# Patient Record
Sex: Male | Born: 1965
Health system: Southern US, Community
[De-identification: ages and names within clinical notes are randomized; demographics above are authoritative.]

## PROBLEM LIST (undated history)

## (undated) DIAGNOSIS — I10 Essential (primary) hypertension: Secondary | ICD-10-CM

## (undated) DIAGNOSIS — E785 Hyperlipidemia, unspecified: Secondary | ICD-10-CM

## (undated) HISTORY — DX: Essential (primary) hypertension: I10

## (undated) HISTORY — DX: Hyperlipidemia, unspecified: E78.5

---

## 1997-10-05 ENCOUNTER — Encounter: Admission: RE | Admit: 1997-10-05 | Discharge: 1998-01-03 | Payer: Self-pay | Admitting: Family Medicine

## 1998-06-13 ENCOUNTER — Emergency Department (HOSPITAL_COMMUNITY): Admission: EM | Admit: 1998-06-13 | Discharge: 1998-06-13 | Payer: Self-pay | Admitting: Emergency Medicine

## 1998-06-13 ENCOUNTER — Encounter: Payer: Self-pay | Admitting: Emergency Medicine

## 1998-06-14 ENCOUNTER — Encounter: Payer: Self-pay | Admitting: Emergency Medicine

## 1998-06-14 ENCOUNTER — Emergency Department (HOSPITAL_COMMUNITY): Admission: EM | Admit: 1998-06-14 | Discharge: 1998-06-14 | Payer: Self-pay | Admitting: Emergency Medicine

## 2000-02-27 ENCOUNTER — Encounter: Admission: RE | Admit: 2000-02-27 | Discharge: 2000-02-27 | Payer: Self-pay | Admitting: Family Medicine

## 2000-02-27 ENCOUNTER — Encounter: Payer: Self-pay | Admitting: Family Medicine

## 2002-08-14 ENCOUNTER — Emergency Department (HOSPITAL_COMMUNITY): Admission: EM | Admit: 2002-08-14 | Discharge: 2002-08-15 | Payer: Self-pay

## 2003-08-21 ENCOUNTER — Emergency Department (HOSPITAL_COMMUNITY): Admission: EM | Admit: 2003-08-21 | Discharge: 2003-08-21 | Payer: Self-pay | Admitting: Emergency Medicine

## 2004-05-16 ENCOUNTER — Ambulatory Visit (HOSPITAL_BASED_OUTPATIENT_CLINIC_OR_DEPARTMENT_OTHER): Admission: RE | Admit: 2004-05-16 | Discharge: 2004-05-16 | Payer: Self-pay | Admitting: Family Medicine

## 2004-05-22 ENCOUNTER — Ambulatory Visit: Payer: Self-pay | Admitting: Internal Medicine

## 2006-06-18 ENCOUNTER — Ambulatory Visit (HOSPITAL_COMMUNITY): Admission: RE | Admit: 2006-06-18 | Discharge: 2006-06-18 | Payer: Self-pay | Admitting: General Surgery

## 2006-06-28 ENCOUNTER — Encounter: Admission: RE | Admit: 2006-06-28 | Discharge: 2006-07-01 | Payer: Self-pay | Admitting: General Surgery

## 2006-12-21 ENCOUNTER — Encounter: Admission: RE | Admit: 2006-12-21 | Discharge: 2006-12-21 | Payer: Self-pay | Admitting: Neurological Surgery

## 2007-01-15 ENCOUNTER — Encounter: Admission: RE | Admit: 2007-01-15 | Discharge: 2007-04-15 | Payer: Self-pay | Admitting: General Surgery

## 2007-01-29 ENCOUNTER — Ambulatory Visit (HOSPITAL_COMMUNITY): Admission: RE | Admit: 2007-01-29 | Discharge: 2007-01-30 | Payer: Self-pay | Admitting: General Surgery

## 2007-01-29 HISTORY — PX: LAPAROSCOPIC GASTRIC BANDING: SHX1100

## 2007-02-05 ENCOUNTER — Emergency Department (HOSPITAL_COMMUNITY): Admission: EM | Admit: 2007-02-05 | Discharge: 2007-02-06 | Payer: Self-pay | Admitting: Emergency Medicine

## 2007-06-06 ENCOUNTER — Encounter: Admission: RE | Admit: 2007-06-06 | Discharge: 2007-06-06 | Payer: Self-pay | Admitting: General Surgery

## 2008-02-20 IMAGING — CR DG CHEST 2V
2 series · 2 of 2 positions shown · non-contrast
Comparison: Chest radiograph, August 2003.

CLINICAL DATA: Morbid obesity.  Pre-op.
 CHEST - 2 VIEW - 01/25/07:

[w chest pa *]
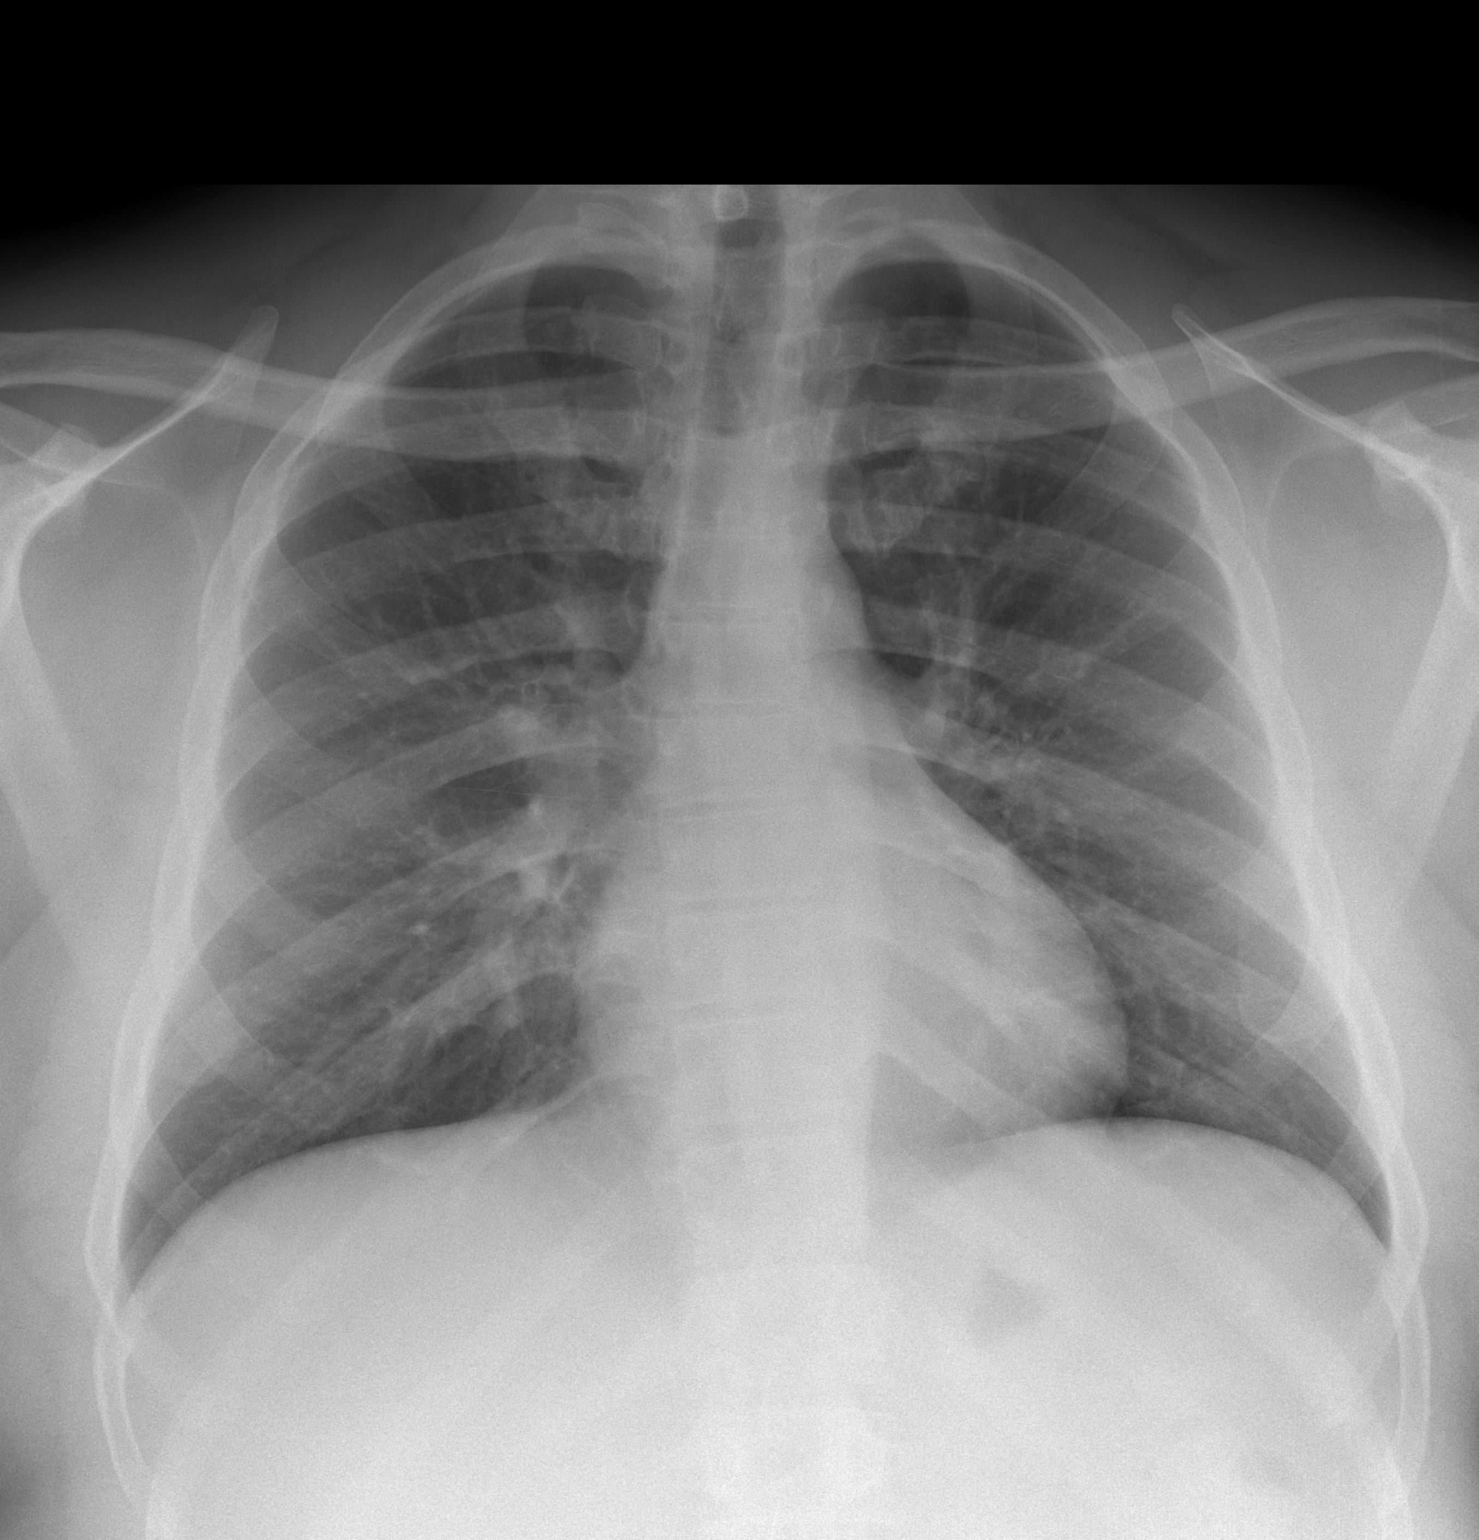

[w chest lat *]
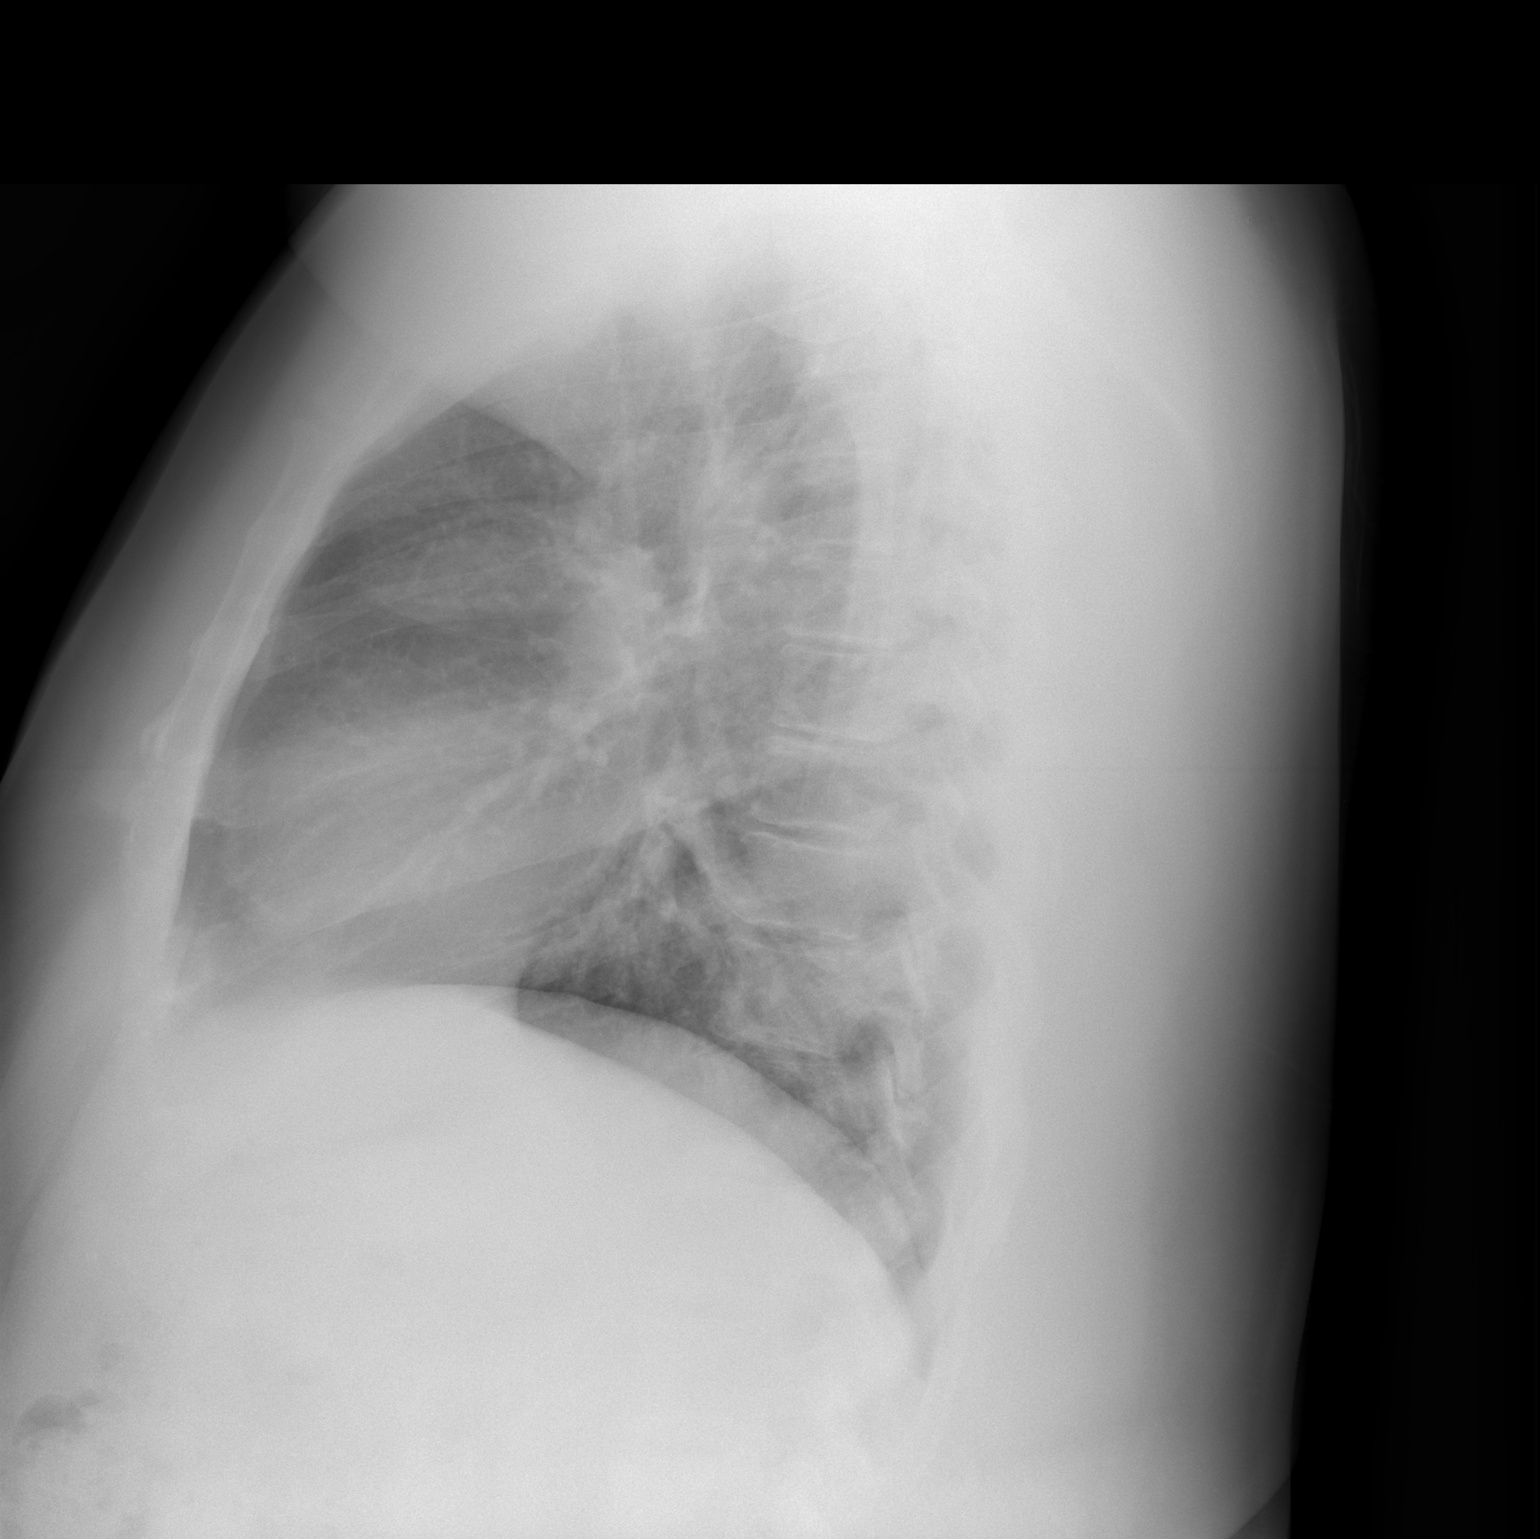

[2 of 2 positions shown; findings below may reference images not displayed]

FINDINGS: The heart and mediastinal contours are within normal limits. The lungs are normally expanded and clear.  Negative for focal air space opacity, pneumothorax, effusion, or edema.  No acute osseous abnormality identified.
IMPRESSION: No evidence of acute cardiopulmonary disease.

## 2010-05-31 NOTE — Op Note (Signed)
Gregory Brady               ACCOUNT NO.:  [id number]   MEDICAL RECORD NO.:  [id number]          PATIENT TYPE:  AMB   LOCATION:  DAY                          FACILITY:  [facility]   PHYSICIAN:  Sharlet Salina T. Hoxworth, M.D.DATE OF BIRTH:  1965-11-19   DATE OF PROCEDURE:  DATE OF DISCHARGE:                               OPERATIVE REPORT   PREOPERATIVE DIAGNOSIS:  Morbid obesity.   POSTOPERATIVE DIAGNOSES:  1. Morbid obesity.  2. Hiatal hernia.   SURGICAL PROCEDURES:  1. Placement of laparoscopic adjustable gastric band.  2. Hiatal hernia repair.   SURGEON:  Lorne Skeens. Hoxworth, M.D.   ASSISTANT:  Thornton Park. Daphine Deutscher, MD   ANESTHESIA:  General.   BRIEF HISTORY:  Gregory Brady is a 45 year old black male with a long  history of progressive morbid obesity unresponsive to medical  management.  He presents with a BMI of 42 and comorbidities of diabetes  mellitus, hypertension, dyslipidemia and sleep apnea.  After extensive  discussion and workup detailed elsewhere, we have elected to proceed  with placement of a laparoscopic adjustable gastric band.  Preoperative  upper GI series shows an apparent small hiatal hernia, and this will be  repaired at this area as well.   DESCRIPTION OF OPERATION:  The patient was brought to the operating room  and placed in supine position on the operating table and general  orotracheal anesthesia was induced.  He had received preoperative  antibiotics.  Subcutaneous heparin had been administered.  The abdomen  was widely sterilely prepped and draped.  Correct patient and procedure  were verified.  Access was obtained with an 11-mm OptiView trocar in the  left subcostal space without difficulty and pneumoperitoneum  established.  Under direct vision a 15-mm trocar was placed in the right  paraxiphoid area through the falciform ligament and an 11-mm trocar in  the right upper abdomen, then another 11-mm trocar just to the left  above the umbilicus for  the camera port and a 5-mm trocar in the left  flank.  Through a 5-mm subxiphoid site, the [facility] retractor was  placed and the left lobe of the liver elevated with excellent exposure  of the hiatus and stomach.  Initially peritoneum was incised at the  angle of His over the left crus, preserving some lateral attachments,  and the finger dissector used to bluntly dissect back toward the  retrogastric space.  Following this the pars flaccida was divided in an  avascular area and the right crus identified and the area of crossing  fat for placement of the band identified.  The sizing tube was then  placed into the stomach and the balloon inflated to 15 mL.  It was  pulled back against the hiatus and was seen to go up through a hiatal  hernia above the diaphragm.  We elected to go ahead and repair this.  The peritoneum just anterior to the right crus was then incised above  the area of where we planned to place the band and dissection was  carried into the retroesophageal area.  A large retroesophageal fat pad  was encountered  and this was dissected away from the crura and brought  down out of the mediastinum, exposing a moderate-sized hiatal hernia.  The esophagus and both crura were clearly identified.  This was a large  fat pad and was excised with the harmonic scalpel.  A posterior crural  repair was then performed with interrupted 0 Ethibond sutures using the  Endostitch, closing the hiatus down to a normal size.  At the completion  of this repair, the sizing tube was placed down into the stomach and the  balloon inflated and with 10-15 mL in the balloon, it was brought back  against the hiatus and was snug at this point.  The sizing tube was  pulled back into the upper esophagus.  The finger dissector was  introduced at the area of crossing fat.  The base of the right crus was  passed without difficulty through the retrogastric area and deployed up  into the previously-dissected area  of the angle of His.  A flushed AP  standard band system was then introduced, the tubing passed into the  finger retractor, which was then brought back around the stomach and the  band brought back around behind the stomach.  The sizing tube was  reintroduced into the abdomen and the band was buckled without  difficulty and the sizing tube easily removed.  Holding the tubing down  toward the patient's feet, the fundus was then imbricated up over the  band to the small gastric pouch with three interrupted 2-0 Ethibond  sutures.  The band appeared to be in excellent position.  There was no  bleeding.  The tubing was brought out through the right upper abdominal  port site.  The [place]athanson retractor was removed, all CO2 evacuated and  trocars removed.  This port site was enlarged to place the port and the  anterior fascia exposed.  The tubing was trimmed and attached to the  port, which was then sutured to the anterior fascia with four  interrupted 2-0 Prolene sutures.  The tubing was seen to curve smoothly  into the abdomen.  Incisions were irrigated.  The subcu was closed at  the port site with running 2-0 Vicryl and skin incisions were closed  with subcuticular 4-0 Monocryl and Dermabond.  Sponge, instrument and  needle counts were correct.  The patient taken recovery in good  condition.      Lorne Skeens. Hoxworth, M.D.  Electronically Signed     BTH/MEDQ  D:  01/29/2007  T:  01/29/2007  Job:  [id number]

## 2010-06-03 NOTE — Procedures (Signed)
[name], Gregory Brady               ACCOUNT NO.:  [id number]   MEDICAL RECORD NO.:  [id number]          PATIENT TYPE:  OUT   LOCATION:  SLEEP CENTER                 FACILITY:  [facility]   PHYSICIAN:  Clinton D. Maple Hudson, M.D. DATE OF BIRTH:  08-08-1965   DATE OF STUDY:  05/16/2004                              NOCTURNAL POLYSOMNOGRAM   STUDY DATE:  May 16, 2004   REFERRING PHYSICIAN:  Dr. Blair Heys   INDICATION FOR STUDY:  Hypersomnia with sleep apnea.  Epworth Sleepiness  Score 7/24, BMI 42, weight 340 pounds.   SLEEP ARCHITECTURE:  Total sleep time 392 minutes with sleep efficiency 84%.  Stage I was 17%, stage II 62%, stages III and IV 12%, REM was 8% of total  sleep time.  Sleep latency 20 minutes, REM latency 360 minutes, awake after  sleep onset 59 minutes, arousal index increased at 34.  No medication taken.   RESPIRATORY DATA:  Split-study protocol.  Respiratory disturbance index  (RDI, AHI) 86.4 obstructive events per hour indicating severe obstructive  sleep apnea/hypopnea syndrome before CPAP.  There were 132 obstructive  apneas and 94 hypopneas before CPAP.  Events were not positional.  REM RDI  was 0.  CPAP was titrated to 13 CWP, RDI 11.7 per hour.  He slept for 108  minutes with CPAP of 11 CWP producing an RDI of 3 per hour with some  residual snoring.  This may be more comfortable and appropriate initially  than pushing to a higher pressure to completely prevent snoring.  A medium  nasal ResMed ComfortGel Mask was used with heated humidifier.   OXYGEN DATA:  Moderate snoring with oxygen desaturation to a nadir of 79%  before CPAP.  After CPAP control saturation held 94-98% on room air.   CARDIAC DATA:  Sinus rhythm with rare PVC.   MOVEMENT/PARASOMNIA:  Occasional leg jerk.   IMPRESSION/RECOMMENDATION:  1.  Severe obstructive sleep apnea/hypopnea syndrome, respiratory      disturbance index 86.4 per hour with oxygen desaturation to 79% and      moderate snoring.  2.  Successful continuous positive airway pressure titration to a      recommended initial pressure of 11 CWP, respiratory disturbance index 3      per hour based on discussion above.  It may be preferable to accept some      breakthrough snoring while avoiding higher pressures which are less      likely to be tolerated.  A medium nasal ResMed ComfortGel Mask was used      with a heated humidifier.      CDY/MEDQ  D:  05/22/2004 10:13:12  T:  05/22/2004 11:19:19  Job:  [id number]

## 2010-10-06 LAB — HEMOGLOBIN AND HEMATOCRIT, BLOOD
HCT: 43.9
Hemoglobin: 15.1

## 2010-10-06 LAB — DIFFERENTIAL
Basophils Absolute: 0.1
Basophils Relative: 1
Eosinophils Absolute: 0
Eosinophils Relative: 0
Lymphocytes Relative: 17
Lymphs Abs: 1.2
Lymphs Abs: 1.5
Monocytes Absolute: 0.9
Monocytes Absolute: 0.7
Monocytes Relative: 7

## 2010-10-06 LAB — BASIC METABOLIC PANEL
CO2: 32
CO2: 25
Chloride: 96
Creatinine, Ser: 1.25
GFR calc Af Amer: >60
GFR calc non Af Amer: >60
Glucose, Bld: 157 — ABNORMAL HIGH
Potassium: 4.1
Potassium: 3.6
Sodium: 131 — ABNORMAL LOW

## 2010-10-06 LAB — D-DIMER, QUANTITATIVE: D-Dimer, Quant: 0.65 — ABNORMAL HIGH

## 2010-10-06 LAB — CBC
HCT: 38.8 — ABNORMAL LOW
HCT: 44.4
Hemoglobin: 13.2
Hemoglobin: 15.2
MCHC: 34.2
Platelets: 271
WBC: 12.2 — ABNORMAL HIGH

## 2010-10-06 LAB — URINALYSIS, ROUTINE W REFLEX MICROSCOPIC
Hgb urine dipstick: NEGATIVE
Nitrite: NEGATIVE
Protein, ur: NEGATIVE

## 2010-10-06 LAB — CK TOTAL AND CKMB (NOT AT ARMC): Total CK: 102

## 2011-02-27 ENCOUNTER — Telehealth (INDEPENDENT_AMBULATORY_CARE_PROVIDER_SITE_OTHER): Payer: Self-pay

## 2011-02-27 NOTE — Telephone Encounter (Signed)
Left message to call our office, rec'd office notes from [facility] Physician that indicate patient need's a lap band adjustment.  Can reschedule patient to 2/14 or 2/19 w/Dr. Johna Sheriff.

## 2011-03-16 ENCOUNTER — Telehealth (INDEPENDENT_AMBULATORY_CARE_PROVIDER_SITE_OTHER): Payer: Self-pay

## 2011-03-16 ENCOUNTER — Ambulatory Visit (INDEPENDENT_AMBULATORY_CARE_PROVIDER_SITE_OTHER): Payer: BC Managed Care – PPO | Admitting: General Surgery

## 2011-03-16 ENCOUNTER — Encounter (INDEPENDENT_AMBULATORY_CARE_PROVIDER_SITE_OTHER): Payer: Self-pay | Admitting: General Surgery

## 2011-03-16 DIAGNOSIS — Z4651 Encounter for fitting and adjustment of gastric lap band: Secondary | ICD-10-CM

## 2011-03-16 NOTE — Patient Instructions (Signed)
Concentrate on exercise 30-40 minutes 4 times per week. Try to have a solid meal at lunch time. We will see you in 2 months.

## 2011-03-16 NOTE — Progress Notes (Signed)
History: Patient returns for followup of his AP standard lap band placed January 2009. We have not seen him for one year. Followup has been somewhat of an issue. The patient initially had moderately successful weight loss being down 62 pounds in August of 2010. He however developed symptoms of over restriction and had had fluid removed late in 2011. He gained quite a bit of weight we saw him again in one year ago at 300 pounds down 34 pounds from surgery. A refill was done. He however does not appear to be getting adequate restriction from the band. His weight is up 10 pounds over one year now at 310 which is a 24 pound weight loss from surgery. He reports he has a small protein shake for breakfast and then either a protein smoothly or appropriate solid food at lunch. At dinner he eats appropriate solid food but essentially unlimited amounts. He gets rare vomiting if he does not chew. No nighttime regurgitation or reflux.  Past Medical History  Diagnosis Date  . Diabetes mellitus   . Hyperlipidemia   . Hypertension    Past Surgical History  Procedure Date  . Laparoscopic gastric banding 01/29/2007   Current Outpatient Prescriptions  Medication Sig Dispense Refill  . amLODipine (NORVASC) 10 MG tablet Take 10 mg by mouth daily.      Marland Kitchen atenolol (TENORMIN) 50 MG tablet Take 50 mg by mouth daily.      Marland Kitchen glipiZIDE (GLUCOTROL) 10 MG tablet Take 10 mg by mouth 2 (two) times daily before a meal.      . losartan (COZAAR) 100 MG tablet Take 100 mg by mouth daily.      . metFORMIN (GLUCOPHAGE) 500 MG tablet Take 500 mg by mouth daily with breakfast.      . simvastatin (ZOCOR) 10 MG tablet Take 10 mg by mouth at bedtime.       Exam:  General: Obese but otherwise well-appearing African male. Abdomen: Soft and nontender. Sided incisions look fine.  Assessment and plan: Morbid obesity with multiple comorbidities status post lap band placement January of 2009. He initially had moderately successful weight  loss but has had some weight regain and also some problems with frequency of followup. We discussed all this. We discussed his diet and that he often moved more toward solid food at lunch for more prolonged fullness. He does not appear to have enough restriction and we elected to go ahead fill today. We discussed exercise and he appears to be getting back into his exercise routine over the last few weeks.  Under sterile technique access to this port and found be expected for 0.25 cc in the port and added 0.25 cc to bring him to 4.5. He was able to swallow water well.  I asked him to return in 2 months for followup.

## 2011-03-21 ENCOUNTER — Encounter (INDEPENDENT_AMBULATORY_CARE_PROVIDER_SITE_OTHER): Payer: Self-pay | Admitting: General Surgery

## 2011-03-21 ENCOUNTER — Ambulatory Visit (INDEPENDENT_AMBULATORY_CARE_PROVIDER_SITE_OTHER): Payer: BC Managed Care – PPO | Admitting: General Surgery

## 2011-03-21 ENCOUNTER — Encounter (INDEPENDENT_AMBULATORY_CARE_PROVIDER_SITE_OTHER): Payer: BC Managed Care – PPO | Admitting: General Surgery

## 2011-03-21 NOTE — Progress Notes (Signed)
Chief complaint: Vomiting post lap band fill  History: Patient returns to the office after having a fill 5 days ago. He did okay for about 24 hours but since then has had a feeling of a knot in his chest particularly when trying to eat or drink and he has vomited any solid food. He does keep liquids down but they're uncomfortable. He's having nighttime regurgitation a foamy fluid.  Assessment and plan: Restriction with lap band. He really was eating too much before the 0.25 cc fill I did last week. We decided to try to split the difference in are removed a 0.15 cc today. He did feel that the water was going down much more easily. I told him to call and I could see him any time in the next couple of days if he does not get relief. He has an appointment in 2 months.

## 2011-03-22 NOTE — Telephone Encounter (Signed)
Error opening encounter

## 2011-04-06 ENCOUNTER — Encounter (INDEPENDENT_AMBULATORY_CARE_PROVIDER_SITE_OTHER): Payer: Self-pay

## 2011-05-11 ENCOUNTER — Encounter (INDEPENDENT_AMBULATORY_CARE_PROVIDER_SITE_OTHER): Payer: BC Managed Care – PPO

## 2011-05-31 ENCOUNTER — Encounter (INDEPENDENT_AMBULATORY_CARE_PROVIDER_SITE_OTHER): Payer: BC Managed Care – PPO | Admitting: General Surgery

## 2011-08-04 ENCOUNTER — Encounter (INDEPENDENT_AMBULATORY_CARE_PROVIDER_SITE_OTHER): Payer: Self-pay | Admitting: General Surgery

## 2011-08-04 ENCOUNTER — Ambulatory Visit (INDEPENDENT_AMBULATORY_CARE_PROVIDER_SITE_OTHER): Payer: BC Managed Care – PPO | Admitting: General Surgery

## 2011-08-04 NOTE — Patient Instructions (Signed)
Call Dr. Johna Sheriff in one week to discuss how you were doing. We will plan followup or possible x-ray based on that.

## 2011-08-04 NOTE — Progress Notes (Signed)
Chief complaint: Followup lap band, dysphagia  History: Patient returns for followup of lap band placed January 2009. He has had some difficulty with weight loss and intermittent obstruction in the past. She however was doing very well in 2010 with an up to 62 pound weight loss. He then was over restricted in September of 2011 that responded to fluid removal but he ended up getting a lot of his weight back. I saw him in February of this year and we did a one quarter cc fill. He came back a couple of weeks later with severe dysphagia and we took out 0.15 cc. He said this helped but he has continued to have difficulty with solid food and this has now gotten worse in recent weeks. If he eats in the evening he has regurgitation at night. He is mostly drinking protein shakes and eating some soft food as he rarely can tolerate solid food even during the day.  Exam: BP 124/82  Pulse 70  Temp 97.8 F (36.6 C) (Temporal)  Resp 16  Ht 6' 3.5" (1.918 m)  Wt 292 lb 8 oz (132.677 kg)  BMI 36.08 kg/m2 Total weight loss 41 pounds, 17 pounds since last visit in March Abdomen: Soft and nontender, port site looks fine  Assessment and plan: Status post lap band. Difficult adjustment range. He clearly has a restriction currently and after discussion I removed the 0.1 cc which should bring him back down to 4.25 cc as he had prior to his last fill in February. We will give this a week and he will call me to let me know how he is doing. If his Demerol semen 3 months. If he is not better on going to get an upper GI series to assess position of his band.

## 2014-06-19 ENCOUNTER — Other Ambulatory Visit: Payer: Self-pay | Admitting: Gastroenterology

## 2014-06-19 DIAGNOSIS — R1310 Dysphagia, unspecified: Secondary | ICD-10-CM

## 2014-06-23 ENCOUNTER — Other Ambulatory Visit: Payer: Self-pay | Admitting: Gastroenterology

## 2014-06-23 ENCOUNTER — Ambulatory Visit
Admission: RE | Admit: 2014-06-23 | Discharge: 2014-06-23 | Disposition: A | Discharge status: Home / Self Care | Payer: 59 | Source: Ambulatory Visit | Attending: Gastroenterology | Admitting: Gastroenterology

## 2014-06-23 DIAGNOSIS — R1310 Dysphagia, unspecified: Secondary | ICD-10-CM

## 2014-11-30 LAB — MICROALBUMIN, URINE: Microalb, Ur: 3.7

## 2014-12-28 ENCOUNTER — Encounter (HOSPITAL_COMMUNITY): Payer: Self-pay | Admitting: Emergency Medicine

## 2014-12-28 ENCOUNTER — Emergency Department (HOSPITAL_COMMUNITY)
Admission: EM | Admit: 2014-12-28 | Discharge: 2014-12-28 | Disposition: A | Discharge status: Home / Self Care | Payer: 59 | Attending: Emergency Medicine | Admitting: Emergency Medicine

## 2014-12-28 DIAGNOSIS — E119 Type 2 diabetes mellitus without complications: Secondary | ICD-10-CM | POA: Diagnosis not present

## 2014-12-28 DIAGNOSIS — S61212A Laceration without foreign body of right middle finger without damage to nail, initial encounter: Secondary | ICD-10-CM | POA: Diagnosis not present

## 2014-12-28 DIAGNOSIS — Y9289 Other specified places as the place of occurrence of the external cause: Secondary | ICD-10-CM | POA: Insufficient documentation

## 2014-12-28 DIAGNOSIS — Z79899 Other long term (current) drug therapy: Secondary | ICD-10-CM | POA: Insufficient documentation

## 2014-12-28 DIAGNOSIS — S61219A Laceration without foreign body of unspecified finger without damage to nail, initial encounter: Secondary | ICD-10-CM

## 2014-12-28 DIAGNOSIS — W292XXA Contact with other powered household machinery, initial encounter: Secondary | ICD-10-CM | POA: Insufficient documentation

## 2014-12-28 DIAGNOSIS — Z7984 Long term (current) use of oral hypoglycemic drugs: Secondary | ICD-10-CM | POA: Diagnosis not present

## 2014-12-28 DIAGNOSIS — I10 Essential (primary) hypertension: Secondary | ICD-10-CM | POA: Insufficient documentation

## 2014-12-28 DIAGNOSIS — Z23 Encounter for immunization: Secondary | ICD-10-CM | POA: Insufficient documentation

## 2014-12-28 DIAGNOSIS — Y9389 Activity, other specified: Secondary | ICD-10-CM | POA: Insufficient documentation

## 2014-12-28 DIAGNOSIS — E785 Hyperlipidemia, unspecified: Secondary | ICD-10-CM | POA: Insufficient documentation

## 2014-12-28 DIAGNOSIS — Y998 Other external cause status: Secondary | ICD-10-CM | POA: Diagnosis not present

## 2014-12-28 DIAGNOSIS — S6991XA Unspecified injury of right wrist, hand and finger(s), initial encounter: Secondary | ICD-10-CM | POA: Diagnosis present

## 2014-12-28 MED ORDER — TETANUS-DIPHTH-ACELL PERTUSSIS 5-2.5-18.5 LF-MCG/0.5 IM SUSP
0.5000 mL | Freq: Once | INTRAMUSCULAR | Status: AC
  Administered 2014-12-28: 0.5 mL via INTRAMUSCULAR
  Filled 2014-12-28: qty 0.5

## 2014-12-28 MED ORDER — CEPHALEXIN 500 MG PO CAPS: 500.0000 mg | ORAL_CAPSULE | Freq: Four times a day (QID) | ORAL | Status: DC

## 2014-12-28 MED ORDER — LIDOCAINE HCL (PF) 1 % IJ SOLN
5.0000 mL | Freq: Once | INTRAMUSCULAR | Status: AC
  Administered 2014-12-28: 5 mL
  Filled 2014-12-28: qty 5

## 2014-12-28 NOTE — ED Notes (Signed)
Pt reports right middle finger laceration occurred around 1 hour ago. Bleeding is controled . Pt co  pain  5/10, alert and oriented x 4.

## 2014-12-28 NOTE — Discharge Instructions (Signed)

## 2014-12-28 NOTE — ED Provider Notes (Signed)
CSN: [id number]     Arrival date & time 12/28/14  2000 History  By signing my name below, I, Gregory Brady, attest that this documentation has been prepared under the direction and in the presence of Genuine PartsShari Sewell Pitner, PA-C. Electronically Signed: Budd PalmerVanessa Brady, ED Scribe. 12/28/2014. 10:27 PM.    Chief Complaint  Patient presents with  . finger laceration    The history is provided by the patient. No language interpreter was used.   HPI Comments: Lia FoyerJohnny L Brady is a 49 y.o. male with a PMHx of DM, HTN, and HLD who presents to the Emergency Department complaining of a laceration to the right 3rd digit sustained 3 hours ago. Pt states he was attempting to move an old washing machine when he cut himself on it. He notes that at first he was not able to control the bleeding, which is why he came to the ED. He states that since arrival, the bleeding has been controlled. He reports associated pain to the area. He does not recall whether he is UTD on his tetanus vaccination. Pt has NKDA.  Past Medical History  Diagnosis Date  . Diabetes mellitus   . Hyperlipidemia   . Hypertension    Past Surgical History  Procedure Laterality Date  . Laparoscopic gastric banding  01/29/2007   No family history on file. Social History  Substance Use Topics  . Smoking status: Never Smoker   . Smokeless tobacco: None  . Alcohol Use: No    Review of Systems  Constitutional: Negative for fever.  Skin: Positive for wound.    Allergies  Review of patient's allergies indicates no known allergies.  Home Medications   Prior to Admission medications   Medication Sig Start Date End Date Taking? Authorizing Provider  amLODipine (NORVASC) 10 MG tablet Take 10 mg by mouth daily.    Historical Provider, MD  atenolol (TENORMIN) 50 MG tablet Take 50 mg by mouth daily.    Historical Provider, MD  glipiZIDE (GLUCOTROL) 10 MG tablet Take 10 mg by mouth 2 (two) times daily before a meal.    Historical Provider, MD   losartan (COZAAR) 100 MG tablet Take 100 mg by mouth daily.    Historical Provider, MD  metFORMIN (GLUCOPHAGE) 500 MG tablet Take 500 mg by mouth daily with breakfast.    Historical Provider, MD  simvastatin (ZOCOR) 10 MG tablet Take 10 mg by mouth at bedtime.    Historical Provider, MD   BP 140/84 mmHg  Pulse 70  Temp(Src) 98.3 F (36.8 C) (Oral)  Resp 16  Wt 308 lb (139.708 kg)  SpO2 95% Physical Exam  Constitutional: He is oriented to person, place, and time. He appears well-developed and well-nourished. No distress.  HENT:  Head: Normocephalic and atraumatic.  Eyes: Conjunctivae are normal. Right eye exhibits no discharge. Left eye exhibits no discharge. No scleral icterus.  Cardiovascular: Normal rate.   Pulmonary/Chest: Effort normal.  Musculoskeletal:  FROM all joints of right middle finger.   Neurological: He is alert and oriented to person, place, and time. Coordination normal.  Skin: Skin is warm and dry. No rash noted. He is not diaphoretic. No erythema. No pallor.  2 cm linear laceration right middle finger along the DIP palmar joint line. Active bleeding. No FB.  Psychiatric: He has a normal mood and affect. His behavior is normal.  Nursing note and vitals reviewed.   ED Course  Procedures  DIAGNOSTIC STUDIES: Oxygen Saturation is 95% on RA, adequate by my interpretation.  COORDINATION OF CARE: 9:45 PM - Discussed plans to order a Tdap and cleanse the wound to further examine it. Pt advised of plan for treatment and pt agrees.  LACERATION REPAIR PROCEDURE NOTE The patient's identification was confirmed and consent was obtained. This procedure was performed by Elpidio Anis, PA-C at 10:16 PM. Site: right 4th palmar finger at the DIP joint line Sterile procedures observed Anesthetic used (type and amt): Lidocaine 1 % Injection 2 mL Suture type/size: 5.0 proline Length: 1 cm  # of Sutures: 4 Technique: interrupted Complexity: simple Antibx ointment  applied Tetanus ordered Site anesthetized, irrigated with NS, explored without evidence of foreign body, wound well approximated, site covered with dry, sterile dressing.  Patient tolerated procedure well without complications. Instructions for care discussed verbally and patient provided with additional written instructions for homecare and f/u.  Labs Review Labs Reviewed - No data to display  Imaging Review No results found. I have personally reviewed and evaluated these images and lab results as part of my medical decision-making.   EKG Interpretation None      MDM   Final diagnoses:  Finger laceration, initial encounter    Uncomplicated finger laceration requiring repair as above note.  I personally performed the services described in this documentation, which was scribed in my presence. The recorded information has been reviewed and is accurate.     Elpidio Anis, PA-C 12/29/14 2105  Lorre Nick, MD 12/31/14 [id number]

## 2015-06-01 LAB — LIPID PANEL: LDL CALC: 75 mg/dL

## 2015-08-30 LAB — BASIC METABOLIC PANEL
CREATININE: 1 mg/dL (ref 0.6–1.3)
GLUCOSE: 272 mg/dL

## 2015-08-30 LAB — HEMOGLOBIN A1C: Hemoglobin A1C: 12

## 2015-10-04 ENCOUNTER — Encounter: Payer: Self-pay | Admitting: Endocrinology

## 2015-10-04 ENCOUNTER — Ambulatory Visit (INDEPENDENT_AMBULATORY_CARE_PROVIDER_SITE_OTHER): Payer: Self-pay | Admitting: Endocrinology

## 2015-10-04 VITALS — BP 138/98 | HR 78 | Temp 98.1°F | Resp 16 | Ht 75.0 in | Wt 308.0 lb

## 2015-10-04 DIAGNOSIS — N521 Erectile dysfunction due to diseases classified elsewhere: Secondary | ICD-10-CM | POA: Diagnosis not present

## 2015-10-04 DIAGNOSIS — E118 Type 2 diabetes mellitus with unspecified complications: Secondary | ICD-10-CM

## 2015-10-04 DIAGNOSIS — E1165 Type 2 diabetes mellitus with hyperglycemia: Secondary | ICD-10-CM

## 2015-10-04 DIAGNOSIS — IMO0002 Reserved for concepts with insufficient information to code with codable children: Secondary | ICD-10-CM

## 2015-10-04 LAB — POCT GLUCOSE (DEVICE FOR HOME USE): GLUCOSE FASTING, POC: 252 mg/dL — AB (ref 70–99)

## 2015-10-04 MED ORDER — VICTOZA 18 MG/3ML ~~LOC~~ SOPN: 1.2000 mg | PEN_INJECTOR | Freq: Every day | SUBCUTANEOUS | 3 refills | Status: DC

## 2015-10-04 NOTE — Patient Instructions (Signed)
Find out coverage for test strips  Start VICTOZA injection as shown once daily at the same time of the day.  Dial the dose to 0.6 mg on the pen for the first week.  You may inject in the stomach, thigh or arm. You may experience nausea in the first few days which usually goes away.   You will feel fullness of the stomach with starting the medication and should try to keep the portions at meals small. After 1 week increase the dose to 1.2mg  daily if no nausea present.    If any questions or concerns are present call the office or the [facility] helpline at [phone]. Visit [email or web address] for more useful information  Stop Onglyza  4 Metformin after Bfst

## 2015-10-04 NOTE — Progress Notes (Addendum)
Reason for Appointment: Consultation for Type 2 Diabetes  Referring physician: Ehinger   History of Present Illness:          Date of diagnosis of type 2 diabetes mellitus:?  1997        Background history:   He was symptomatic at diagnosis with increased urination He has been mostly treated with metformin and glipizide in the past He was taking Januvia more recently but this was changed in 2017 2 Onglyza because of insurance preference He does not think his sugars have been consistently controlled in the past, best A1c recently appears to be in 05/2014 which was 7.5 but subsequently has been at least 8%  Recent history:   Non-insulin hypoglycemic drugs the patient is taking are: Metformin ER 500, 2 tablets in a.m., glipizide ER 10 mg qd., Onglyza 5 mg daily  Current management, blood sugar patterns and problems identified:  He has not checked his blood sugar in several months at home  His A1c has increased progressively this year, previously 8.6 and 9.1%  He has not been motivated to take care of his diabetes and he feels stressed  Although he is supposed to take metformin twice a day he tends to forget to take the evening doses  Glucose in the office today is about 250 fasting  Does not follow any specific meal plan although he is generally avoiding fast food and sweets  Currently trying to walk on the treadmill 3 times a week  He does complain of feeling tired although not sleeping well  He is not excessively thirsty or urinating at night           Side effects from medications have been: none  Compliance with the medical regimen: Inadequate Hypoglycemia:   never  Glucose monitoring: Not done,  has several meters at home with no strips  Self-care: The diet that the patient has been following is: tries to limit fast food .     Meal times are:  Breakfast is at 9 AM-12 pm Lunch: 2-4 PM Dinner:  6-8 PM  Typical meal intake: Breakfast is cereal              Dietician visit, most recent: Never               Exercise: 3/7 days, walking 1 hour on the treadmill   Weight history:  He had a gastric band in 2009 but this was loosened a couple of years ago because with taking metformin he would get uncomfortably bloated and has gained weight However has lost at least 10 pounds in the last couple of months  Wt Readings from Last 3 Encounters:  10/04/15 (!) 308 lb (139.7 kg)  12/28/14 (!) 308 lb (139.7 kg)  08/04/11 292 lb 8 oz (132.7 kg)    Glycemic control:    Lab Results  Component Value Date   HGBA1C 12 08/30/2015   Lab Results  Component Value Date   MICROALBUR 3.7 11/30/2014   LDLCALC 75 06/01/2015   CREATININE 1.0 08/30/2015   No results found for: [facility]       Medication List       Accurate as of 10/04/15  2:55 PM. Always use your most recent med list.          amLODipine 10 MG tablet Commonly known as:  NORVASC Take 10 mg by mouth daily.   aspirin EC 81 MG tablet Take 81 mg  by mouth daily.   atenolol 50 MG tablet Commonly known as:  TENORMIN Take 50 mg by mouth daily.   atorvastatin 80 MG tablet Commonly known as:  LIPITOR   CALTRATE 600+D PLUS MINERALS 600-800 MG-UNIT Tabs Take by mouth.   glipiZIDE 10 MG tablet Commonly known as:  GLUCOTROL Take 10 mg by mouth 2 (two) times daily before a meal.   metFORMIN 500 MG tablet Commonly known as:  GLUCOPHAGE Take 500 mg by mouth daily with breakfast.   multivitamin-iron-minerals-folic acid chewable tablet Chew 1 tablet by mouth daily.   sildenafil 100 MG tablet Commonly known as:  VIAGRA Take 100 mg by mouth daily as needed for erectile dysfunction.   valsartan-hydrochlorothiazide 320-25 MG tablet Commonly known as:  DIOVAN-HCT   VICTOZA 18 MG/3ML Sopn Generic drug:  Liraglutide Inject 0.2 mLs (1.2 mg total) into the skin daily. Inject once daily at the same time       Allergies: No Known Allergies  Past Medical History:  Diagnosis  Date  . Diabetes mellitus   . Hyperlipidemia   . Hypertension     Past Surgical History:  Procedure Laterality Date  . LAPAROSCOPIC GASTRIC BANDING  01/29/2007    No family history on file.  Social History:  reports that he has never smoked. He has never used smokeless tobacco. He reports that he drinks about 3.0 oz of alcohol per week . He reports that he does not use drugs.   Review of Systems  Constitutional: Positive for malaise.  HENT: Negative for trouble swallowing.   Eyes: Negative for blurred vision.  Respiratory: Positive for daytime sleepiness. Negative for shortness of breath.        Previously treated for sleep apnea, has history of snoring.  He was not able to wear the CPAP mask consistently at night and not using it for several years  Cardiovascular: Negative for chest pain and leg swelling.  Gastrointestinal: Negative for diarrhea and abdominal pain.  Endocrine: Positive for fatigue. Negative for polydipsia.  Genitourinary: Negative for nocturia.  Musculoskeletal: Negative for back pain.  Skin: Negative for itching.  Neurological: Negative for numbness and tingling.  Psychiatric/Behavioral: Positive for depressed mood and insomnia.   He has had some erectile dysfunction but generally not sexually active now and cannot afford Viagra that was previously prescribed  Lipid history:     Lab Results  Component Value Date   LDLCALC 75 06/01/2015           Hypertension:  Most recent eye exam was   Most recent foot exam:    LABS:  Office Visit on 10/04/2015  Component Date Value Ref Range Status  . Microalb, Ur 11/30/2014 3.7   Final  . Glucose 08/30/2015 272  mg/dL Final  . Creatinine 40/98/119108/14/2017 1.0  0.6 - 1.3 mg/dL Final  . LDL Cholesterol 06/01/2015 75  mg/dL Final  . Hemoglobin [id number] 08/30/2015 12   Final  . Glucose Fasting, POC 10/04/2015 252* 70 - 99 mg/dL Final    Physical Examination:  BP (!) 138/98   Pulse 78   Temp 98.1 F (36.7 C)    Resp 16   Ht 6\' 3"  (1.905 m)   Wt (!) 308 lb (139.7 kg)   SpO2 98%   BMI 38.50 kg/m   GENERAL:         Patient has generalized obesity.   HEENT:         Eye exam shows normal external appearance. Fundus exam shows no retinopathy. Oral  exam shows normal mucosa .  NECK:   There is no lymphadenopathy Thyroid is not enlarged and no nodules felt.  Carotids are normal to palpation and no bruit heard LUNGS:         Chest is symmetrical. Lungs are clear to auscultation.Marland Kitchen   HEART:         Heart sounds:  S1 and S2 are normal. No murmur or click heard., no S3 or S4.   ABDOMEN:   There is no distention present. Liver and spleen are not palpable. No other mass or tenderness present.   NEUROLOGICAL:   Ankle jerks are absent bilaterally.    Diabetic Foot Exam - Simple   Simple Foot Form Diabetic Foot exam was performed with the following findings:  Yes 10/04/2015  2:54 PM  Visual Inspection No deformities, no ulcerations, no other skin breakdown bilaterally:  Yes Sensation Testing Intact to touch and monofilament testing bilaterally:  Yes Pulse Check Posterior Tibialis and Dorsalis pulse intact bilaterally:  Yes Comments            Vibration sense is Mildly reduced in distal first toes. MUSCULOSKELETAL:  There is no swelling or deformity of the peripheral joints. Spine is normal to inspection.   EXTREMITIES:     There is no edema. No skin lesions present.Marland Kitchen SKIN:       No rash or lesions of concern.        ASSESSMENT:  Diabetes type 2, uncontrolled, relatively long-standing and BMI of 39  He has had progressive worsening of his diabetes control since 2016 Currently on a regimen of metformin 1000 mg, glipizide ER and Onglyza Has had inadequate diabetes education and also has very little motivation to manage his diabetes and obesity well Not monitoring blood sugar for some time  Although he is likely to be seen efficiently insulin deficient he is not very symptomatic with the hyperglycemia and  glucose readings are in the 200+ range    Complications of diabetes: Erectile dysfunction  Other known problems: HYPERTENSION, hyperlipidemia Currently blood pressure is relatively high and uncontrolled on current regimen  History of other medical problems: Balanitis, sleep apnea, erectile dysfunction  Probable hypogonadism secondary to insulin resistant syndrome, not clear if he has had previous evaluation for this  PLAN:    Discussed with the patient the nature of GLP-1 drugs, the actions on various organ systems, how they benefit blood glucose control, as well as the benefit of weight loss and  increase satiety . Explained possible side effects especially nausea and vomiting initially; discussed safety information in package insert.  Described the injection technique and dosage titration of Victoza  starting with 0.6 mg once a day at the same time for the first week and then increasing to 1.2 mg if no symptoms of nausea.  Educational brochure on Victoza and co-pay card given  This was replaced Onglyza  He needs to take 2000 mg of metformin ER in the morning daily, currently not compliant with taking her evening doses  Continue glipizide for now  Consider adding Jardiance 10 mg daily, will forward of at this time because of his recent episode of balanitis  He will find out what insurance coverage he has for the brand-name test strips and will send in prescriptions for required meter and test strips  Start checking blood sugars either fasting or 2 hours after eating  Consultation with dietitian  Follow-up in 4 weeks Continue follow-up with PCP for hyperlipidemia and hypertension Also may benefit from treatment  of depression and reduction in alcohol intake  Labs for next visit will include free testosterone and LH level  Patient Instructions  Find out coverage for test strips  Start VICTOZA injection as shown once daily at the same time of the day.  Dial the dose to 0.6 mg on the  pen for the first week.  You may inject in the stomach, thigh or arm. You may experience nausea in the first few days which usually goes away.   You will feel fullness of the stomach with starting the medication and should try to keep the portions at meals small. After 1 week increase the dose to 1.2mg  daily if no nausea present.    If any questions or concerns are present call the office or the Victoza [facility] helpline at [phone]. Visit [email or web address] for more useful information  Stop Onglyza  4 Metformin after Bfst     Counseling time on subjects discussed above is over 50% of today's 60 minute visit   Allexus Ovens 10/04/2015, 2:55 PM   Note: This office note was prepared with Dragon voice recognition system technology. Any transcriptional errors that result from this process are unintentional.

## 2015-10-05 ENCOUNTER — Telehealth: Payer: Self-pay | Admitting: Endocrinology

## 2015-10-05 NOTE — Telephone Encounter (Signed)
Pt is using the contour meter

## 2015-10-06 NOTE — Telephone Encounter (Signed)
Called and left message on vm

## 2015-10-07 ENCOUNTER — Other Ambulatory Visit: Payer: Self-pay

## 2015-10-07 DIAGNOSIS — E1165 Type 2 diabetes mellitus with hyperglycemia: Secondary | ICD-10-CM

## 2015-10-07 DIAGNOSIS — E118 Type 2 diabetes mellitus with unspecified complications: Principal | ICD-10-CM

## 2015-10-07 DIAGNOSIS — IMO0002 Reserved for concepts with insufficient information to code with codable children: Secondary | ICD-10-CM

## 2015-10-07 MED ORDER — GLUCOSE BLOOD VI STRP: ORAL_STRIP | 2 refills | Status: DC

## 2015-10-11 ENCOUNTER — Telehealth: Payer: Self-pay | Admitting: Endocrinology

## 2015-10-11 ENCOUNTER — Other Ambulatory Visit: Payer: Self-pay | Admitting: *Deleted

## 2015-10-11 MED ORDER — GLUCOSE BLOOD VI STRP: ORAL_STRIP | 2 refills | Status: DC

## 2015-10-11 NOTE — Telephone Encounter (Signed)
Rx sent 

## 2015-10-11 NOTE — Telephone Encounter (Signed)
[facility] needs a new rx for the contour test strips not the contour next

## 2015-10-14 ENCOUNTER — Encounter: Payer: Self-pay | Admitting: Dietician

## 2015-10-14 ENCOUNTER — Encounter: Payer: BLUE CROSS/BLUE SHIELD | Attending: Endocrinology | Admitting: Dietician

## 2015-10-14 DIAGNOSIS — IMO0002 Reserved for concepts with insufficient information to code with codable children: Secondary | ICD-10-CM

## 2015-10-14 DIAGNOSIS — E118 Type 2 diabetes mellitus with unspecified complications: Secondary | ICD-10-CM | POA: Diagnosis not present

## 2015-10-14 DIAGNOSIS — Z713 Dietary counseling and surveillance: Secondary | ICD-10-CM | POA: Insufficient documentation

## 2015-10-14 DIAGNOSIS — E1165 Type 2 diabetes mellitus with hyperglycemia: Secondary | ICD-10-CM | POA: Insufficient documentation

## 2015-10-14 NOTE — Patient Instructions (Signed)
Rethink what you drink.  Avoid beverages with carbohydrates.  Avoid or limit alcohol. Be as active as possible.  Aim for 30-60 minutes most days. Find things you can do for stress relief:  Drive, music, dance, gym etc. Watch your portion size. Do not skip meals.  Aim for 3 Carb Choices per meal (45 grams) +/- 1 either way  Aim for 0-1 Carbs per snack if hungry  Include protein in moderation with your meals and snacks Consider reading food labels for Total Carbohydrate and Fat Grams of foods Consider checking BG at alternate times per day as directed by MD  Consider taking medication as directed by MD.

## 2015-10-14 NOTE — Progress Notes (Signed)
Diabetes Self-Management Education  Visit Type: First/Initial  Appt. Start Time: 1110 Appt. End Time: 1200  10/14/2015  Gregory Brady, identified by name and date of birth, is a 50 y.o. male with a diagnosis of Diabetes: Type 2. Other hx includes gastric lap band in 2009, HTN and hyperlipidemia.    Patient lives alone.  He states that he was unemployed for 4 years which caused him a lot of stress.  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  He states that friends help him shop and cook healthfully.  He admits to overuse of alcohol (liquor mixed with regular soda) due to stress.  He disregarded healthy eating for some time and has resumed for the past 3 weeks.    ASSESSMENT  Height 6' 3.5" (1.918 m), weight (!) 306 lb (138.8 kg). Body mass index is 37.74 kg/m.  Highest adult weight 365 lbs Lowest adult weight 265 lbs      Diabetes Self-Management Education - 10/14/15 1124      Visit Information   Visit Type First/Initial     Initial Visit   Diabetes Type Type 2   Are you currently following a meal plan? No   Are you taking your medications as prescribed? Yes   Date Diagnosed 1997     Psychosocial Assessment   Patient Belief/Attitude about Diabetes Defeat/Burnout   Self-care barriers Other (comment)  increased stress   Self-management support Doctor's office;Friends   Other persons present Patient   Patient Concerns Nutrition/Meal planning;Weight Control;Glycemic Control;Support   Special Needs None   Preferred Learning Style No preference indicated   Learning Readiness Ready   How often do you need to have someone help you when you read instructions, pamphlets, or other written materials from your doctor or pharmacy? 1 - Never   What is the last grade level you completed in school? 12th grade     Pre-Education Assessment   Patient understands the diabetes disease and treatment process. Needs Instruction   Patient understands incorporating nutritional management  into lifestyle. Needs Review   Patient undertands incorporating physical activity into lifestyle. Needs Review   Patient understands using medications safely. Demonstrates understanding / competency   Patient understands monitoring blood glucose, interpreting and using results Needs Review   Patient understands prevention, detection, and treatment of acute complications. Demonstrates understanding / competency   Patient understands prevention, detection, and treatment of chronic complications. Needs Review   Patient understands how to develop strategies to address psychosocial issues. Needs Review   Patient understands how to develop strategies to promote health/change behavior. Needs Review     Complications   Last HgB A1C per patient/outside source 12 %  08/30/2015   How often do you check your blood sugar? 0 times/day (not testing)   Have you had a dilated eye exam in the past 12 months? Yes   Have you had a dental exam in the past 12 months? Yes   Are you checking your feet? No     Dietary Intake   Breakfast honey nut cheerios, milk OR protein shake with fruit   Snack (morning) none   Lunch tuna or other sub from [employer]   Snack (afternoon) none   Dinner baked chicken, fish, shrimp, mashed potatoes or rice vegetables   Snack (evening) none   Beverage(s) alcohol (liquor 3 ounces per day weekdays, or 1/2 a fifth mixed with regular soda)     Patient Education   Previous Diabetes Education Yes (please comment)  for bariatric surgery   Nutrition management  Role of diet in the treatment of diabetes and the relationship between the three main macronutrients and blood glucose level;Food label reading, portion sizes and measuring food.;Meal options for control of blood glucose level and chronic complications.;Information on hints to eating out and maintain blood glucose control.;Other (comment)  alcohol and other beverage choices and immpact on diabetes and weight   Physical activity and  exercise  Role of exercise on diabetes management, blood pressure control and cardiac health.   Monitoring Identified appropriate SMBG and/or A1C goals.;Daily foot exams;Yearly dilated eye exam   Acute complications Discussed and identified patients' treatment of hyperglycemia.   Chronic complications Relationship between chronic complications and blood glucose control;Assessed and discussed foot care and prevention of foot problems   Psychosocial adjustment Worked with patient to identify barriers to care and solutions;Role of stress on diabetes;Identified and addressed patients feelings and concerns about diabetes   Personal strategies to promote health Lifestyle issues that need to be addressed for better diabetes care     Individualized Goals (developed by patient)   Nutrition General guidelines for healthy choices and portions discussed   Physical Activity Exercise 5-7 days per week;60 minutes per day;30 minutes per day;45 minutes per day   Medications take my medication as prescribed   Monitoring  test my blood glucose as discussed   Reducing Risk Other (comment);do foot checks daily  decrease alcohol and sugar beverage intake   Health Coping Other (comment)     Post-Education Assessment   Patient understands the diabetes disease and treatment process. Demonstrates understanding / competency   Patient understands incorporating nutritional management into lifestyle. Demonstrates understanding / competency   Patient undertands incorporating physical activity into lifestyle. Demonstrates understanding / competency   Patient understands using medications safely. Demonstrates understanding / competency   Patient understands monitoring blood glucose, interpreting and using results Demonstrates understanding / competency   Patient understands prevention, detection, and treatment of acute complications. Demonstrates understanding / competency   Patient understands prevention, detection, and  treatment of chronic complications. Demonstrates understanding / competency   Patient understands how to develop strategies to address psychosocial issues. Needs Review   Patient understands how to develop strategies to promote health/change behavior. Needs Review     Outcomes   Expected Outcomes Demonstrated interest in learning. Expect positive outcomes   Future DMSE PRN   Program Status Completed      Individualized Plan for Diabetes Self-Management Training:   Learning Objective:  Patient will have a greater understanding of diabetes self-management. Patient education plan is to attend individual and/or group sessions per assessed needs and concerns.   Plan:   Patient Instructions  Rethink what you drink.  Avoid beverages with carbohydrates.  Avoid or limit alcohol. Be as active as possible.  Aim for 30-60 minutes most days. Find things you can do for stress relief:  Drive, music, dance, gym etc. Watch your portion size. Do not skip meals.  Aim for 3 Carb Choices per meal (45 grams) +/- 1 either way  Aim for 0-1 Carbs per snack if hungry  Include protein in moderation with your meals and snacks Consider reading food labels for Total Carbohydrate and Fat Grams of foods Consider checking BG at alternate times per day as directed by MD  Consider taking medication as directed by MD.    Expected Outcomes:  Demonstrated interest in learning. Expect positive outcomes  Education material provided: Living Well with Diabetes, Food label handouts, A1C conversion  sheet, Meal plan card, My Plate, Snack sheet and Support group flyer  If problems or questions, patient to contact team via:  Phone and Email  Future DSME appointment: PRN

## 2015-11-08 ENCOUNTER — Other Ambulatory Visit (INDEPENDENT_AMBULATORY_CARE_PROVIDER_SITE_OTHER): Payer: BLUE CROSS/BLUE SHIELD

## 2015-11-08 DIAGNOSIS — E118 Type 2 diabetes mellitus with unspecified complications: Secondary | ICD-10-CM

## 2015-11-08 DIAGNOSIS — N521 Erectile dysfunction due to diseases classified elsewhere: Secondary | ICD-10-CM | POA: Diagnosis not present

## 2015-11-08 DIAGNOSIS — IMO0002 Reserved for concepts with insufficient information to code with codable children: Secondary | ICD-10-CM

## 2015-11-08 DIAGNOSIS — E1165 Type 2 diabetes mellitus with hyperglycemia: Secondary | ICD-10-CM

## 2015-11-08 LAB — BASIC METABOLIC PANEL
BUN: 9 mg/dL (ref 6–23)
CO2: 31 mEq/L (ref 19–32)
CREATININE: 0.98 mg/dL (ref 0.40–1.50)
Calcium: 9.6 mg/dL (ref 8.4–10.5)
Chloride: 99 mEq/L (ref 96–112)
GFR: 104.13 mL/min (ref >60.00)
Glucose, Bld: 253 mg/dL — ABNORMAL HIGH (ref 70–99)
Potassium: 3.8 mEq/L (ref 3.5–5.1)
Sodium: 140 mEq/L (ref 135–145)

## 2015-11-08 LAB — LUTEINIZING HORMONE: LH: 3.87 m[IU]/mL (ref 1.50–9.30)

## 2015-11-10 LAB — FRUCTOSAMINE: FRUCTOSAMINE: 345 umol/L — AB (ref 0–285)

## 2015-11-10 LAB — TESTOSTERONE, FREE, TOTAL, SHBG
SEX HORMONE BINDING: 27.5 nmol/L (ref 16.5–55.9)
TESTOSTERONE: 187 ng/dL — AB (ref 264–916)
Testosterone, Free: 11.2 pg/mL (ref 6.8–21.5)

## 2015-11-11 ENCOUNTER — Encounter: Payer: Self-pay | Admitting: Endocrinology

## 2015-11-11 ENCOUNTER — Ambulatory Visit (INDEPENDENT_AMBULATORY_CARE_PROVIDER_SITE_OTHER): Payer: BLUE CROSS/BLUE SHIELD | Admitting: Endocrinology

## 2015-11-11 VITALS — BP 124/82 | HR 80 | Ht 75.5 in | Wt 307.0 lb

## 2015-11-11 DIAGNOSIS — E1165 Type 2 diabetes mellitus with hyperglycemia: Secondary | ICD-10-CM | POA: Diagnosis not present

## 2015-11-11 DIAGNOSIS — I10 Essential (primary) hypertension: Secondary | ICD-10-CM

## 2015-11-11 MED ORDER — VICTOZA 18 MG/3ML ~~LOC~~ SOPN: 1.8000 mg | PEN_INJECTOR | Freq: Every day | SUBCUTANEOUS | 3 refills | Status: DC

## 2015-11-11 MED ORDER — METFORMIN HCL ER 500 MG PO TB24: 2000.0000 mg | ORAL_TABLET | Freq: Every day | ORAL | 3 refills | Status: DC

## 2015-11-11 NOTE — Patient Instructions (Addendum)
Check blood sugars on waking up 3-4x per  week  Also check blood sugars about 2 hours after a meal and do this after different meals by rotation  Recommended blood sugar levels on waking up is 90-130 and about 2 hours after meal is 130-160  Please bring your blood sugar monitor to each visit, thank you  Victoza 1.8mg  daily

## 2015-11-11 NOTE — Progress Notes (Signed)
Reason for Appointment: Follow-up for Type 2 Diabetes  Referring physician: Ehinger   History of Present Illness:          Date of diagnosis of type 2 diabetes mellitus:?  1997        Background history:   He was symptomatic at diagnosis with increased urination He has been mostly treated with metformin and glipizide in the past He was taking Januvia more recently but this was changed in 2017 2 Onglyza because of insurance preference He does not think his sugars have been consistently controlled in the past, best A1c recently appears to be in 05/2014 which was 7.5 but subsequently has been at least 8%  Recent history:   Non-insulin hypoglycemic drugs the patient is taking are: Metformin ER 500, 2 tablets in a.m., glipizide ER 10 mg qd.,  Victoza 1.2 mg daily   His A1c has increased progressively this year up to 12%, previously 8.6 and 9.1%  Current management, blood sugar patterns and problems identified:  He has  checked his blood sugar since his initial consultation but did not bring his meter  He has tolerated Victoza and now taking 1.2 mg without nausea.  His blood sugars at home are being checked mostly fasting and these are inconsistent  He says he has difficulty being consistent with medications, diet and alcohol intake  Blood sugars have been near normal occasionally in the mornings  However they are higher when he forgets his medication, was off his diet or drinks more alcohol at night.  Has not lost any weight despite consultation with dietitian  Lab glucose was 253 and he did not take any medications the day before.  He says he has not been able to exercise in the last 2 weeks because of having a cold.  He has been able to take metformin 2000 mg a day in the morning without side effects         Side effects from medications have been: none  Compliance with the medical regimen: Inadequate Hypoglycemia:   never  Glucose monitoring: ?  Contour  meter  Readings by recall:  PRE-MEAL Fasting Lunch Dinner Bedtime Overall  Glucose range: 112-221  149     Mean/median:        POST-MEAL PC Breakfast PC Lunch PC Dinner  Glucose range:     Mean/median:        Self-care: The diet that the patient has been following is: tries to limit fast food .     Meal times are:  Breakfast is at 9 AM-12 pm Lunch: 2-4 PM Dinner:  6-8 PM  Typical meal intake: Breakfast is cereal             Dietician visit, most recent: 9/17               Exercise: 3/7 days, walking 1 hour on the treadmill but less recently  Weight history:  He had a gastric band in 2009 but this was loosened a couple of years ago because with taking metformin he would get uncomfortably bloated and has gained weight  Wt Readings from Last 3 Encounters:  11/11/15 (!) 307 lb (139.3 kg)  10/14/15 (!) 306 lb (138.8 kg)  10/04/15 (!) 308 lb (139.7 kg)    Glycemic control:    Lab Results  Component Value Date   HGBA1C 12 08/30/2015   Lab Results  Component Value Date   MICROALBUR 3.7 11/30/2014  LDLCALC 75 06/01/2015   CREATININE 0.98 11/08/2015   No results found for: MICRALBCREAT  Lab on 11/08/2015  Component Date Value Ref Range Status  . Testosterone 11/10/2015 187* 264 - 916 ng/dL Final   Comment: Adult male reference interval is based on a population of healthy nonobese males (BMI <30) between 13 and 39 years old. Travison, et.al. JCEM [phone]. PMID: [id number].   Marland Kitchen Testosterone, Free 11/10/2015 11.2  6.8 - 21.5 pg/mL Final  . Sex Hormone Binding 11/10/2015 27.5  16.5 - 55.9 nmol/L Final  . Fructosamine 11/10/2015 345* 0 - 285 umol/L Final   Comment: Published reference interval for apparently healthy subjects between age 71 and 79 is 54 - 285 umol/L and in a poorly controlled diabetic population is 228 - 563 umol/L with a mean of 396 umol/L.   Marland Kitchen Sodium 11/08/2015 140  135 - 145 mEq/L Final  . Potassium 11/08/2015 3.8  3.5 - 5.1 mEq/L Final  .  Chloride 11/08/2015 99  96 - 112 mEq/L Final  . CO2 11/08/2015 31  19 - 32 mEq/L Final  . Glucose, Bld 11/08/2015 253* 70 - 99 mg/dL Final  . BUN 28/41/3244 9  6 - 23 mg/dL Final  . Creatinine, Ser 11/08/2015 0.98  0.40 - 1.50 mg/dL Final  . Calcium 01/18/7251 9.6  8.4 - 10.5 mg/dL Final  . GFR 66/44/0347 104.13  >60.00 mL/min Final  . LH 11/08/2015 3.87  1.50 - 9.30 mIU/mL Final   Comment: Male Reference Range:20-70 yrs     1.5-9.3 mIU/mL>70 yrs       3.1-35.6 mIU/mLFemale Reference Range:Follicular Phase     1.9-12.5 mIU/mLMidcycle             8.7-76.3 mIU/mLLuteal Phase         0.5-16.9 mIU/mL  Post Menopausal      15.9-54.0  mIU/mLPregnant             <1.5 mIU/mLContraceptives       0.7-5.6 mIU/mL        Medication List       Accurate as of 11/11/15 11:28 AM. Always use your most recent med list.          amLODipine 10 MG tablet Commonly known as:  NORVASC Take 10 mg by mouth daily.   aspirin EC 81 MG tablet Take 81 mg by mouth daily.   atorvastatin 80 MG tablet Commonly known as:  LIPITOR   CALTRATE 600+D PLUS MINERALS 600-800 MG-UNIT Tabs Take by mouth.   glipiZIDE 10 MG tablet Commonly known as:  GLUCOTROL Take 10 mg by mouth 2 (two) times daily before a meal.   glucose blood test strip Commonly known as:  BAYER CONTOUR TEST Use as instructed to check blood sugar 3 times per day   metFORMIN 500 MG 24 hr tablet Commonly known as:  GLUCOPHAGE-XR Take 4 tablets (2,000 mg total) by mouth daily with supper.   multivitamin-iron-minerals-folic acid chewable tablet Chew 1 tablet by mouth daily.   sildenafil 100 MG tablet Commonly known as:  VIAGRA Take 100 mg by mouth daily as needed for erectile dysfunction.   valsartan-hydrochlorothiazide 320-25 MG tablet Commonly known as:  DIOVAN-HCT   VICTOZA 18 MG/3ML Sopn Generic drug:  liraglutide Inject 0.3 mLs (1.8 mg total) into the skin daily. Inject once daily at the same time       Allergies: No Known  Allergies  Past Medical History:  Diagnosis Date  . Diabetes mellitus   . Hyperlipidemia   .  Hypertension     Past Surgical History:  Procedure Laterality Date  . LAPAROSCOPIC GASTRIC BANDING  01/29/2007    No family history on file.  Social History:  reports that he has never smoked. He has never used smokeless tobacco. He reports that he drinks about 3.0 oz of alcohol per week . He reports that he does not use drugs.   Review of Systems   He has had some erectile dysfunction but generally not sexually active now and cannot afford Viagra that was previously prescribed Recent free testosterone level is normal  Lipid history:     Lab Results  Component Value Date   LDLCALC 75 06/01/2015           Hypertension:Being treated with Diovan HCT and amlodipine  Most recent eye exam was   Most recent foot exam: 9/17    LABS:  Lab on 11/08/2015  Component Date Value Ref Range Status  . Testosterone 11/10/2015 187* 264 - 916 ng/dL Final   Comment: Adult male reference interval is based on a population of healthy nonobese males (BMI <30) between 3119 and 50 years old. Travison, et.al. JCEM [phone],102;1161-1173. PMID: [id number].   Marland Kitchen. Testosterone, Free 11/10/2015 11.2  6.8 - 21.5 pg/mL Final  . Sex Hormone Binding 11/10/2015 27.5  16.5 - 55.9 nmol/L Final  . Fructosamine 11/10/2015 345* 0 - 285 umol/L Final   Comment: Published reference interval for apparently healthy subjects between age 50 and 3960 is 53205 - 285 umol/L and in a poorly controlled diabetic population is 228 - 563 umol/L with a mean of 396 umol/L.   Marland Kitchen. Sodium 11/08/2015 140  135 - 145 mEq/L Final  . Potassium 11/08/2015 3.8  3.5 - 5.1 mEq/L Final  . Chloride 11/08/2015 99  96 - 112 mEq/L Final  . CO2 11/08/2015 31  19 - 32 mEq/L Final  . Glucose, Bld 11/08/2015 253* 70 - 99 mg/dL Final  . BUN 62/13/086510/23/2017 9  6 - 23 mg/dL Final  . Creatinine, Ser 11/08/2015 0.98  0.40 - 1.50 mg/dL Final  . Calcium 78/46/962910/23/2017 9.6   8.4 - 10.5 mg/dL Final  . GFR 52/84/132410/23/2017 104.13  >60.00 mL/min Final  . LH 11/08/2015 3.87  1.50 - 9.30 mIU/mL Final   Comment: Male Reference Range:20-70 yrs     1.5-9.3 mIU/mL>70 yrs       3.1-35.6 mIU/mLFemale Reference Range:Follicular Phase     1.9-12.5 mIU/mLMidcycle             8.7-76.3 mIU/mLLuteal Phase         0.5-16.9 mIU/mL  Post Menopausal      15.9-54.0  mIU/mLPregnant             <1.5 mIU/mLContraceptives       0.7-5.6 mIU/mL     Physical Examination:  BP 124/82   Pulse 80   Ht 6' 3.5" (1.918 m)   Wt (!) 307 lb (139.3 kg)   SpO2 96%   BMI 37.87 kg/m     ASSESSMENT:  Diabetes type 2, uncontrolled, relatively long-standing and BMI of 39 See history of present illness for detailed discussion of current diabetes management, blood sugar patterns and problems identified  With starting Victoza and increasing his metformin his blood sugars appear to be improving although not consistent Fructosamine of 345 still indicates significant hyperglycemia overall However his baseline A1c was 12  He can do better with his compliance with medications, taking Victoza at the same time and avoiding alcohol and higher fat meals  Other known problems: HYPERTENSION.  Blood pressure is better today compared to last visit  No evidence of hypogonadism, free testosterone is normal  He needs to discuss his symptoms of depression with PCP  PLAN:     Check blood sugars more consistently after meals and bring monitor for download  Improve diet and also reduce alcohol intake as discussed with dietitian  Increase Victoza to 1.8 mg and take at the same time daily  Consider adding Jardiance if blood sugars not controlled and he has difficulty losing weight  Discussed blood sugar targets at various times  Resume exercise when able to  Recheck A1c on the next visit  Recommended influenza vaccine but he does not want to do this now  Patient Instructions  Check blood sugars on waking up  3-4x per  week  Also check blood sugars about 2 hours after a meal and do this after different meals by rotation  Recommended blood sugar levels on waking up is 90-130 and about 2 hours after meal is 130-160  Please bring your blood sugar monitor to each visit, thank you  Victoza 1.8mg  daily    Counseling time on subjects discussed above is over 50% of today's 25 minute visit   Bijou Easler 11/11/2015, 11:28 AM   Note: This office note was prepared with Insurance underwriter. Any transcriptional errors that result from this process are unintentional.

## 2015-12-15 ENCOUNTER — Telehealth: Payer: Self-pay | Admitting: Endocrinology

## 2015-12-15 NOTE — Telephone Encounter (Signed)
[facility]

## 2015-12-15 NOTE — Telephone Encounter (Signed)
victoza rx is needing to be called in please to [facility]

## 2015-12-20 ENCOUNTER — Other Ambulatory Visit (INDEPENDENT_AMBULATORY_CARE_PROVIDER_SITE_OTHER): Payer: BLUE CROSS/BLUE SHIELD

## 2015-12-20 DIAGNOSIS — E1165 Type 2 diabetes mellitus with hyperglycemia: Secondary | ICD-10-CM | POA: Diagnosis not present

## 2015-12-20 LAB — COMPREHENSIVE METABOLIC PANEL
ALBUMIN: 4.3 g/dL (ref 3.5–5.2)
ALT: 25 U/L (ref 0–53)
AST: 22 U/L (ref 0–37)
Alkaline Phosphatase: 95 U/L (ref 39–117)
BUN: 10 mg/dL (ref 6–23)
CHLORIDE: 97 meq/L (ref 96–112)
CO2: 33 meq/L — AB (ref 19–32)
Calcium: 9.6 mg/dL (ref 8.4–10.5)
Creatinine, Ser: 1.04 mg/dL (ref 0.40–1.50)
GFR: 97.18 mL/min (ref >60.00)
Glucose, Bld: 173 mg/dL — ABNORMAL HIGH (ref 70–99)
POTASSIUM: 3.6 meq/L (ref 3.5–5.1)
SODIUM: 138 meq/L (ref 135–145)
Total Bilirubin: 0.4 mg/dL (ref 0.2–1.2)
Total Protein: 7.9 g/dL (ref 6.0–8.3)

## 2015-12-20 LAB — HEMOGLOBIN A1C: HEMOGLOBIN A1C: 9.9 % — AB (ref 4.6–6.5)

## 2015-12-23 ENCOUNTER — Ambulatory Visit (INDEPENDENT_AMBULATORY_CARE_PROVIDER_SITE_OTHER): Payer: BLUE CROSS/BLUE SHIELD | Admitting: Endocrinology

## 2015-12-23 ENCOUNTER — Encounter: Payer: Self-pay | Admitting: Endocrinology

## 2015-12-23 VITALS — BP 132/86 | HR 90 | Ht 76.0 in | Wt 310.0 lb

## 2015-12-23 DIAGNOSIS — I10 Essential (primary) hypertension: Secondary | ICD-10-CM

## 2015-12-23 DIAGNOSIS — E1165 Type 2 diabetes mellitus with hyperglycemia: Secondary | ICD-10-CM

## 2015-12-23 MED ORDER — EMPAGLIFLOZIN 10 MG PO TABS: 10.0000 mg | ORAL_TABLET | Freq: Every day | ORAL | 3 refills | Status: DC

## 2015-12-23 MED ORDER — GLIPIZIDE ER 5 MG PO TB24: 5.0000 mg | ORAL_TABLET | Freq: Every day | ORAL | 2 refills | Status: DC

## 2015-12-23 NOTE — Progress Notes (Signed)
Reason for Appointment: Follow-up for Type 2 Diabetes  Referring physician: Ehinger   History of Present Illness:          Date of diagnosis of type 2 diabetes mellitus:?  1997        Background history:   He was symptomatic at diagnosis with increased urination He has been mostly treated with metformin and glipizide in the past He was taking Januvia more recently but this was changed in 2017 2 Onglyza because of insurance preference He does not think his sugars have been consistently controlled in the past, best A1c recently appears to be in 05/2014 which was 7.5 but subsequently has been at least 8%  Recent history:   Non-insulin hypoglycemic drugs the patient is taking are: Metformin ER 500, 4 tablets in a.m., glipizide ER 10 mg qd.,  Victoza 1.8 mg daily   His A1c had increased progressively this year up to 12%, now 9.9%  Current management, blood sugar patterns and problems identified:  He has  checked his blood sugar only sporadically since his last visit despite instructions  Fasting blood sugars are somewhat variable with a couple of readings below 140 but his lab glucose was 173  He has only 3 readings later in the day, not clear which readings are post prandial, highest reading 249 after lunch  He thinks his blood sugars are variable because of his inconsistent diet and also sometimes with alcohol intake  He still has not started exercising as recommended  Although he has been instructed on his diet by the dietitian 3 months ago he is not motivated to follow this consistently  Currently no side effects from Victoza 1.8 mg which was increased on the last visit Has checked only 7 readings in the last month       Side effects from medications have been: none  Compliance with the medical regimen: Inadequate Hypoglycemia:   never  Glucose monitoring: Using Accu-Chek Aviva  meter  Readings by download:  PRE-MEAL Fasting Lunch 7:30-8:30  Bedtime Overall   Glucose range: 127-216   108, 198     Mean/median:        POST-MEAL PC Breakfast PC Lunch PC Dinner  Glucose range:  249    Mean/median:       Self-care: The diet that the patient has been following is: tries to limit fast food .     Meal times are:  Breakfast is at 9 AM-12 pm Lunch: 2-4 PM Dinner:  6-8 PM  Typical meal intake: Breakfast is cereal             Dietician visit, most recent: 9/17               Exercise: 0-3/7 days, walking 1 hour on the treadmill   Weight history:  He had a gastric band in 2009 but this was loosened a couple of years ago because with taking metformin he would get uncomfortably bloated and has gained weight  Wt Readings from Last 3 Encounters:  12/23/15 (!) 310 lb (140.6 kg)  11/11/15 (!) 307 lb (139.3 kg)  10/14/15 (!) 306 lb (138.8 kg)    Glycemic control:    Lab Results  Component Value Date   HGBA1C 9.9 (H) 12/20/2015   HGBA1C 12 08/30/2015   Lab Results  Component Value Date   MICROALBUR 3.7 11/30/2014   LDLCALC 75 06/01/2015   CREATININE 1.04 12/20/2015   No results found for:  MICRALBCREAT  Lab on 12/20/2015  Component Date Value Ref Range Status  . Hgb A1c MFr Bld 12/20/2015 9.9* 4.6 - 6.5 % Final  . Sodium 12/20/2015 138  135 - 145 mEq/L Final  . Potassium 12/20/2015 3.6  3.5 - 5.1 mEq/L Final  . Chloride 12/20/2015 97  96 - 112 mEq/L Final  . CO2 12/20/2015 33* 19 - 32 mEq/L Final  . Glucose, Bld 12/20/2015 173* 70 - 99 mg/dL Final  . BUN 91/47/829512/04/2015 10  6 - 23 mg/dL Final  . Creatinine, Ser 12/20/2015 1.04  0.40 - 1.50 mg/dL Final  . Total Bilirubin 12/20/2015 0.4  0.2 - 1.2 mg/dL Final  . Alkaline Phosphatase 12/20/2015 95  39 - 117 U/L Final  . AST 12/20/2015 22  0 - 37 U/L Final  . ALT 12/20/2015 25  0 - 53 U/L Final  . Total Protein 12/20/2015 7.9  6.0 - 8.3 g/dL Final  . Albumin 62/13/086512/04/2015 4.3  3.5 - 5.2 g/dL Final  . Calcium 78/46/962912/04/2015 9.6  8.4 - 10.5 mg/dL Final  . GFR 52/84/132412/04/2015 97.18  >60.00 mL/min Final        Medication List       Accurate as of 12/23/15 12:35 PM. Always use your most recent med list.          amLODipine 10 MG tablet Commonly known as:  NORVASC Take 10 mg by mouth daily.   aspirin EC 81 MG tablet Take 81 mg by mouth daily.   atorvastatin 80 MG tablet Commonly known as:  LIPITOR   CALTRATE 600+D PLUS MINERALS 600-800 MG-UNIT Tabs Take by mouth.   empagliflozin 10 MG Tabs tablet Commonly known as:  JARDIANCE Take 10 mg by mouth daily with breakfast.   glipiZIDE 10 MG tablet Commonly known as:  GLUCOTROL Take 10 mg by mouth 2 (two) times daily before a meal.   glucose blood test strip Commonly known as:  BAYER CONTOUR TEST Use as instructed to check blood sugar 3 times per day   metFORMIN 500 MG 24 hr tablet Commonly known as:  GLUCOPHAGE-XR Take 4 tablets (2,000 mg total) by mouth daily with supper.   multivitamin-iron-minerals-folic acid chewable tablet Chew 1 tablet by mouth daily.   sildenafil 100 MG tablet Commonly known as:  VIAGRA Take 100 mg by mouth daily as needed for erectile dysfunction.   valsartan-hydrochlorothiazide 320-25 MG tablet Commonly known as:  DIOVAN-HCT   VICTOZA 18 MG/3ML Sopn Generic drug:  liraglutide Inject 0.3 mLs (1.8 mg total) into the skin daily. Inject once daily at the same time       Allergies: No Known Allergies  Past Medical History:  Diagnosis Date  . Diabetes mellitus   . Hyperlipidemia   . Hypertension     Past Surgical History:  Procedure Laterality Date  . LAPAROSCOPIC GASTRIC BANDING  01/29/2007    No family history on file.  Social History:  reports that he has never smoked. He has never used smokeless tobacco. He reports that he drinks about 3.0 oz of alcohol per week . He reports that he does not use drugs.   Review of Systems  He has had some erectile dysfunction but generally not sexually active now and cannot afford Viagra that was previously prescribed His free testosterone  level is normal  Lipid history:     Lab Results  Component Value Date   LDLCALC 75 06/01/2015           Hypertension:Being treated with Diovan HCT  and amlodipine  BP Readings from Last 3 Encounters:  12/23/15 132/86  11/11/15 124/82  10/04/15 (!) 138/98    Most recent eye exam was 2015  Most recent foot exam: 9/17    LABS:  Lab on 12/20/2015  Component Date Value Ref Range Status  . Hgb A1c MFr Bld 12/20/2015 9.9* 4.6 - 6.5 % Final  . Sodium 12/20/2015 138  135 - 145 mEq/L Final  . Potassium 12/20/2015 3.6  3.5 - 5.1 mEq/L Final  . Chloride 12/20/2015 97  96 - 112 mEq/L Final  . CO2 12/20/2015 33* 19 - 32 mEq/L Final  . Glucose, Bld 12/20/2015 173* 70 - 99 mg/dL Final  . BUN 04/54/0981 10  6 - 23 mg/dL Final  . Creatinine, Ser 12/20/2015 1.04  0.40 - 1.50 mg/dL Final  . Total Bilirubin 12/20/2015 0.4  0.2 - 1.2 mg/dL Final  . Alkaline Phosphatase 12/20/2015 95  39 - 117 U/L Final  . AST 12/20/2015 22  0 - 37 U/L Final  . ALT 12/20/2015 25  0 - 53 U/L Final  . Total Protein 12/20/2015 7.9  6.0 - 8.3 g/dL Final  . Albumin 19/14/7829 4.3  3.5 - 5.2 g/dL Final  . Calcium 56/21/3086 9.6  8.4 - 10.5 mg/dL Final  . GFR 57/84/6962 97.18  >60.00 mL/min Final    Physical Examination:  BP 132/86   Pulse 90   Ht 6\' 4"  (1.93 m)   Wt (!) 310 lb (140.6 kg)   SpO2 92%   BMI 37.73 kg/m     ASSESSMENT:  Diabetes type 2, uncontrolled, relatively long-standing and BMI of 39 See history of present illness for detailed discussion of current diabetes management, blood sugar patterns and problems identified  Although his blood sugars are relatively better than on his initial consultation there is still inadequately controlled He has not lost any weight even with Victoza 1.8 mg Most of his variability is related to inconsistent diet. He is also not motivated to exercise or check his sugars as directed  A1c is still high at 9.9 and this was explained to him He is a good  candidate for medication like Jardiance Discussed action of SGLT 2 drugs on lowering glucose by decreasing kidney absorption of glucose, benefits of weight loss and lower blood pressure, possible side effects including candidiasis and dosage regimen   HYPERTENSION.  Blood pressure is still relatively high, checked twice in the office today Discussed blood pressure targets Also needs to moderate on alcohol intake  Obesity: Discussed need for weight loss for long-term benefits and possible need for insulin in the future   PLAN:     Check blood sugars more consistently at various times including after meals and bring monitor for download  Improve diet consistently and also reduce alcohol intake   Trial of JARDIANCE 10 mg daily  With this change will reduce his glipizide ER down to 5 mg.  Will not change his antihypertensives as yet and blood pressure is relatively high and he is starting Jardiance   Patient Instructions  Reduce Glipizide to 1/2   Jardiance in am  Victoza in pm  Restart exercise and diet  Check blood sugars on waking up 3x weekly   Also check blood sugars about 2-3 hours after a meal and do this after different meals by rotation  Recommended blood sugar levels on waking up is 90-130 and about 2 hours after meal is 130-160  Please bring your blood sugar monitor to each visit,  thank you  Counseling time on subjects discussed above is over 50% of today's 25 minute visit   Gregory Brady 12/23/2015, 12:35 PM   Note: This office note was prepared with Insurance underwriterDragon voice recognition system technology. Any transcriptional errors that result from this process are unintentional.

## 2015-12-23 NOTE — Patient Instructions (Signed)
Reduce Glipizide to 1/2   Jardiance in am  Victoza in pm  Restart exercise and diet  Check blood sugars on waking up 3x weekly   Also check blood sugars about 2-3 hours after a meal and do this after different meals by rotation  Recommended blood sugar levels on waking up is 90-130 and about 2 hours after meal is 130-160  Please bring your blood sugar monitor to each visit, thank you

## 2016-01-31 ENCOUNTER — Other Ambulatory Visit (INDEPENDENT_AMBULATORY_CARE_PROVIDER_SITE_OTHER): Payer: BLUE CROSS/BLUE SHIELD

## 2016-01-31 DIAGNOSIS — E1165 Type 2 diabetes mellitus with hyperglycemia: Secondary | ICD-10-CM

## 2016-01-31 LAB — BASIC METABOLIC PANEL
BUN: 12 mg/dL (ref 6–23)
CALCIUM: 9.4 mg/dL (ref 8.4–10.5)
CHLORIDE: 99 meq/L (ref 96–112)
CO2: 31 mEq/L (ref 19–32)
CREATININE: 0.96 mg/dL (ref 0.40–1.50)
GFR: 106.54 mL/min (ref >60.00)
Glucose, Bld: 194 mg/dL — ABNORMAL HIGH (ref 70–99)
Potassium: 3.8 mEq/L (ref 3.5–5.1)
Sodium: 141 mEq/L (ref 135–145)

## 2016-02-01 LAB — FRUCTOSAMINE: Fructosamine: 322 umol/L — ABNORMAL HIGH (ref 0–285)

## 2016-02-03 ENCOUNTER — Ambulatory Visit: Payer: BLUE CROSS/BLUE SHIELD | Admitting: Endocrinology

## 2016-02-08 ENCOUNTER — Ambulatory Visit (INDEPENDENT_AMBULATORY_CARE_PROVIDER_SITE_OTHER): Payer: BLUE CROSS/BLUE SHIELD | Admitting: Endocrinology

## 2016-02-08 ENCOUNTER — Other Ambulatory Visit: Payer: Self-pay

## 2016-02-08 ENCOUNTER — Encounter: Payer: Self-pay | Admitting: Endocrinology

## 2016-02-08 VITALS — BP 134/96 | HR 80 | Ht 76.0 in | Wt 302.0 lb

## 2016-02-08 DIAGNOSIS — I10 Essential (primary) hypertension: Secondary | ICD-10-CM

## 2016-02-08 DIAGNOSIS — E1165 Type 2 diabetes mellitus with hyperglycemia: Secondary | ICD-10-CM

## 2016-02-08 MED ORDER — INSULIN GLARGINE 100 UNIT/ML SOLOSTAR PEN: PEN_INJECTOR | SUBCUTANEOUS | 99 refills | Status: DC

## 2016-02-08 NOTE — Patient Instructions (Addendum)
Check blood sugars on waking up daily   Also check blood sugars about 2 hours after a meal and do this after different meals by rotation  Recommended blood sugar levels on waking up is 90-130 and about 2 hours after meal is 130-160  Please bring your blood sugar monitor to each visit, thank you   Lantus insulin: This insulin provides blood sugar control for up to 24 hours.   Start with 8units at bedtime daily and increase by 2 units every 3 days until the waking up sugars are under 130. Then continue the same dose.  If blood sugar is under 90 for 2 days in a row, reduce the dose by 2 units.  Note that this insulin does not control the rise of blood sugar with meals

## 2016-02-08 NOTE — Progress Notes (Signed)
Reason for Appointment: Follow-up for Type 2 Diabetes  Referring physician: Ehinger   History of Present Illness:          Date of diagnosis of type 2 diabetes mellitus:?  1997        Background history:   He was symptomatic at diagnosis with increased urination He has been mostly treated with metformin and glipizide in the past He was taking Januvia more recently but this was changed in 2017 2 Onglyza because of insurance preference He does not think his sugars have been consistently controlled in the past, best A1c recently appears to be in 05/2014 which was 7.5 but subsequently has been at least 8%  Recent history:   Non-insulin hypoglycemic drugs the patient is taking are: Metformin ER 500, 4 tablets in a.m., glipizide ER 10 mg qd.,  Victoza 1.8 mg daily, Jardiance 10 mg daily   His A1c was 9.9% on his last visit he was started on Jardiance  had increased progressively this year up to 12%, now 9.9%  Current management, blood sugar patterns and problems identified:  He has  checked his blood sugar periodically and mostly in the morning hours  He has had overall some improvement in his blood sugars but blood sugars are overall fairly inconsistent  He has some readings over 200 but he thinks that these are from drinking a lot of Gatorade  He tolerated Jardiance overall fairly well although he is having some increased urination at night and an episode of genital yeast infection which he used a cream for.  Has lost some weight with starting Jardiance  He has started exercising more regularly about 3 days a week at the gym  He thinks his diet can be still better        Side effects from medications have been: none  Compliance with the medical regimen: Inadequate Hypoglycemia:   never  Glucose monitoring: Using Accu-Chek Aviva  Meter  Mean values apply above for all meters except median for One Touch  PRE-MEAL Fasting Lunch Dinner Bedtime Overall  Glucose  range: 130-198  127-169,  97-217  220    Mean/median: 166    166     Self-care: The diet that the patient has been following is: tries to limit fast food .     Meal times are:  Breakfast is at 9 AM-12 pm Lunch: 2-4 PM Dinner:  6-8 PM  Typical meal intake: Breakfast is cereal             Dietician visit, most recent: 9/17               Exercise: 3/7 days, walking 1 hour on the treadmill   Weight history:  He had a gastric band in 2009 but this was loosened a couple of years ago because with taking metformin he would get uncomfortably bloated and has gained weight  Wt Readings from Last 3 Encounters:  02/08/16 (!) 302 lb (137 kg)  12/23/15 (!) 310 lb (140.6 kg)  11/11/15 (!) 307 lb (139.3 kg)    Glycemic control:    Lab Results  Component Value Date   HGBA1C 9.9 (H) 12/20/2015   HGBA1C 12 08/30/2015   Lab Results  Component Value Date   MICROALBUR 3.7 11/30/2014   LDLCALC 75 06/01/2015   CREATININE 0.96 01/31/2016   No results found for: MICRALBCREAT  No visits with results within 1 Week(s) from this visit.  Latest known  visit with results is:  Lab on 01/31/2016  Component Date Value Ref Range Status  . Sodium 01/31/2016 141  135 - 145 mEq/L Final  . Potassium 01/31/2016 3.8  3.5 - 5.1 mEq/L Final  . Chloride 01/31/2016 99  96 - 112 mEq/L Final  . CO2 01/31/2016 31  19 - 32 mEq/L Final  . Glucose, Bld 01/31/2016 194* 70 - 99 mg/dL Final  . BUN 96/29/528401/15/2018 12  6 - 23 mg/dL Final  . Creatinine, Ser 01/31/2016 0.96  0.40 - 1.50 mg/dL Final  . Calcium 13/24/401001/15/2018 9.4  8.4 - 10.5 mg/dL Final  . GFR 27/25/366401/15/2018 106.54  >60.00 mL/min Final  . Fructosamine 01/31/2016 322* 0 - 285 umol/L Final   Comment: Published reference interval for apparently healthy subjects between age 320 and 7360 is 43205 - 285 umol/L and in a poorly controlled diabetic population is 228 - 563 umol/L with a mean of 396 umol/L.      Allergies as of 02/08/2016   No Known Allergies     Medication List        Accurate as of 02/08/16 12:44 PM. Always use your most recent med list.          amLODipine 10 MG tablet Commonly known as:  NORVASC Take 10 mg by mouth daily.   aspirin EC 81 MG tablet Take 81 mg by mouth daily.   atorvastatin 80 MG tablet Commonly known as:  LIPITOR   CALTRATE 600+D PLUS MINERALS 600-800 MG-UNIT Tabs Take by mouth.   empagliflozin 10 MG Tabs tablet Commonly known as:  JARDIANCE Take 10 mg by mouth daily with breakfast.   glipiZIDE 5 MG 24 hr tablet Commonly known as:  GLUCOTROL XL Take 1 tablet (5 mg total) by mouth daily with breakfast.   glucose blood test strip Commonly known as:  BAYER CONTOUR TEST Use as instructed to check blood sugar 3 times per day   Insulin Glargine 100 UNIT/ML Solostar Pen Commonly known as:  LANTUS SOLOSTAR Inject 20 units daily   metFORMIN 500 MG 24 hr tablet Commonly known as:  GLUCOPHAGE-XR Take 4 tablets (2,000 mg total) by mouth daily with supper.   multivitamin-iron-minerals-folic acid chewable tablet Chew 1 tablet by mouth daily.   sildenafil 100 MG tablet Commonly known as:  VIAGRA Take 100 mg by mouth daily as needed for erectile dysfunction.   valsartan-hydrochlorothiazide 320-25 MG tablet Commonly known as:  DIOVAN-HCT   VICTOZA 18 MG/3ML Sopn Generic drug:  liraglutide Inject 0.3 mLs (1.8 mg total) into the skin daily. Inject once daily at the same time       Allergies: No Known Allergies  Past Medical History:  Diagnosis Date  . Diabetes mellitus   . Hyperlipidemia   . Hypertension     Past Surgical History:  Procedure Laterality Date  . LAPAROSCOPIC GASTRIC BANDING  01/29/2007    No family history on file.  Social History:  reports that he has never smoked. He has never used smokeless tobacco. He reports that he drinks about 3.0 oz of alcohol per week . He reports that he does not use drugs.   Review of Systems  He has had some erectile dysfunction but Again not sexually  active now He was able to get Viagra but he thinks this is not effective His free testosterone level was normal  Lipid history:     Lab Results  Component Value Date   LDLCALC 75 06/01/2015  Hypertension: Being treated with Diovan HCT and amlodipine, ran out Of medications 2 days ago and blood pressure is higher today  BP Readings from Last 3 Encounters:  02/08/16 (!) 134/96  12/23/15 132/86  11/11/15 124/82    Most recent eye exam was 2015, Has not scheduled follow-up  Most recent foot exam: 9/17    LABS:  No visits with results within 1 Week(s) from this visit.  Latest known visit with results is:  Lab on 01/31/2016  Component Date Value Ref Range Status  . Sodium 01/31/2016 141  135 - 145 mEq/L Final  . Potassium 01/31/2016 3.8  3.5 - 5.1 mEq/L Final  . Chloride 01/31/2016 99  96 - 112 mEq/L Final  . CO2 01/31/2016 31  19 - 32 mEq/L Final  . Glucose, Bld 01/31/2016 194* 70 - 99 mg/dL Final  . BUN 16/10/9602 12  6 - 23 mg/dL Final  . Creatinine, Ser 01/31/2016 0.96  0.40 - 1.50 mg/dL Final  . Calcium 54/09/8117 9.4  8.4 - 10.5 mg/dL Final  . GFR 14/78/2956 106.54  >60.00 mL/min Final  . Fructosamine 01/31/2016 322* 0 - 285 umol/L Final   Comment: Published reference interval for apparently healthy subjects between age 66 and 18 is 56 - 285 umol/L and in a poorly controlled diabetic population is 228 - 563 umol/L with a mean of 396 umol/L.     Physical Examination:  BP (!) 134/96   Pulse 80   Ht 6\' 4"  (1.93 m)   Wt (!) 302 lb (137 kg)   SpO2 96%   BMI 36.76 kg/m     ASSESSMENT:  Diabetes type 2, uncontrolled, relatively long-standing and BMI of 39 See history of present illness for detailed discussion of current diabetes management, blood sugar patterns and problems identified  Although his blood sugars are relatively better With adding Jardiance he still has mostly high readings at home and only occasional good readings around lunchtime He  has lost some weight with Jardiance and also trying to exercise more However can do better with diet and also avoiding drinks sugar  Since he has had some polyuria and nocturia from Jardiance and also candidiasis may not tolerate low higher dose of Jardiance, currently on 10 mg He is already on a 4 drug regimen Fructosamine indicates overall high readings, most likely postprandial but also has mostly high readings on waking up  HYPERTENSION.  Blood pressure is still relatively high, he thinks this is from not taking his medications for 2 days Not clear if he has benefited from adding Jardiance  ERECTILE dysfunction: He has not had any benefit from Viagra 100 mg but currently is not sexually active  PLAN:     Check blood sugars more consistently.  He will start basal insulin, discussed options of insulin treatment and gave him a flowsheet to dose Lantus insulin starting with 8 units.  Showed him how to use the insulin pen, timing of the injection.  Also discussed duration of action of insulin and fasting blood sugar targets with titration every 3 days  He will continue other medications unchanged for now but eventually can stop glipizide  A1c on the next visit  More consistent diet   Patient Instructions  Check blood sugars on waking up daily   Also check blood sugars about 2 hours after a meal and do this after different meals by rotation  Recommended blood sugar levels on waking up is 90-130 and about 2 hours after meal is 130-160  Please bring your blood sugar monitor to each visit, thank you   Lantus insulin: This insulin provides blood sugar control for up to 24 hours.   Start with 8units at bedtime daily and increase by 2 units every 3 days until the waking up sugars are under 130. Then continue the same dose.  If blood sugar is under 90 for 2 days in a row, reduce the dose by 2 units.  Note that this insulin does not control the rise of blood sugar with meals     Counseling time on subjects discussed above is over 50% of today's 25 minute visit   Gregory Brady 02/08/2016, 12:44 PM   Note: This office note was prepared with Insurance underwriter. Any transcriptional errors that result from this process are unintentional.

## 2016-03-02 LAB — LIPID PANEL
CHOLESTEROL: 129 mg/dL (ref 0–200)
Triglycerides: 113 mg/dL (ref 40–160)

## 2016-03-05 LAB — LIPID PANEL
HDL: 41 mg/dL (ref 35–70)
LDL Cholesterol: 66 mg/dL

## 2016-03-17 ENCOUNTER — Other Ambulatory Visit (INDEPENDENT_AMBULATORY_CARE_PROVIDER_SITE_OTHER): Payer: BLUE CROSS/BLUE SHIELD

## 2016-03-17 DIAGNOSIS — E1165 Type 2 diabetes mellitus with hyperglycemia: Secondary | ICD-10-CM | POA: Diagnosis not present

## 2016-03-17 LAB — COMPREHENSIVE METABOLIC PANEL
ALT: 24 U/L (ref 0–53)
AST: 20 U/L (ref 0–37)
Albumin: 4.2 g/dL (ref 3.5–5.2)
Alkaline Phosphatase: 75 U/L (ref 39–117)
BILIRUBIN TOTAL: 0.4 mg/dL (ref 0.2–1.2)
BUN: 13 mg/dL (ref 6–23)
CHLORIDE: 98 meq/L (ref 96–112)
CO2: 32 mEq/L (ref 19–32)
CREATININE: 1.11 mg/dL (ref 0.40–1.50)
Calcium: 9.9 mg/dL (ref 8.4–10.5)
GFR: 90.06 mL/min (ref >60.00)
GLUCOSE: 174 mg/dL — AB (ref 70–99)
Potassium: 4.3 mEq/L (ref 3.5–5.1)
Sodium: 139 mEq/L (ref 135–145)
Total Protein: 7.6 g/dL (ref 6.0–8.3)

## 2016-03-17 LAB — MICROALBUMIN / CREATININE URINE RATIO
Creatinine,U: 172.4 mg/dL
MICROALB/CREAT RATIO: 0.4 mg/g (ref 0.0–30.0)
Microalb, Ur: <0.7 mg/dL (ref 0.0–1.9)

## 2016-03-17 LAB — HEMOGLOBIN A1C: HEMOGLOBIN A1C: 8.5 % — AB (ref 4.6–6.5)

## 2016-03-20 ENCOUNTER — Encounter: Payer: Self-pay | Admitting: Endocrinology

## 2016-03-20 ENCOUNTER — Ambulatory Visit (INDEPENDENT_AMBULATORY_CARE_PROVIDER_SITE_OTHER): Payer: BLUE CROSS/BLUE SHIELD | Admitting: Endocrinology

## 2016-03-20 VITALS — BP 120/80 | HR 85 | Ht 76.0 in | Wt 298.0 lb

## 2016-03-20 DIAGNOSIS — E1165 Type 2 diabetes mellitus with hyperglycemia: Secondary | ICD-10-CM

## 2016-03-20 NOTE — Progress Notes (Signed)
Reason for Appointment: Follow-up for Type 2 Diabetes  Referring physician: Ehinger   History of Present Illness:          Date of diagnosis of type 2 diabetes mellitus:?  1997        Background history:   He was symptomatic at diagnosis with increased urination He has been mostly treated with metformin and glipizide in the past He was taking Januvia more recently but this was changed in 2017 2 Onglyza because of insurance preference He does not think his sugars have been consistently controlled in the past, best A1c recently appears to be in 05/2014 which was 7.5 but subsequently has been at least 8%  Recent history:   Non-insulin hypoglycemic drugs the patient is taking are: Metformin ER 500, 4 tablets in a.m., glipizide ER 5mg  qd.,  Victoza 1.8 mg daily, Jardiance 10 mg daily   His A1c has come down to 8.5, previously 9.9 He has been started on LANTUS insulin since his last visit in 01/2016  Current management, blood sugar patterns and problems identified:  He has  checked his blood sugar periodically and mostly before breakfast  He was started on 8 units of Lantus and he went up to 10 units  However he appears to be irregular with his Lantus injections and also fasting readings are variable  However the last 4 readings in the mornings are near normal at home; lab glucose was 175 fasting  He thinks that his sugars are higher in the morning if he is drinking alcohol at night  Does not appear to have high readings at bedtime and has only 3 readings  He has however started losing weight  Does not appear to be motivated to be consistent with his diet, glucose monitoring or medications        Side effects from medications have been: none  Compliance with the medical regimen: Inadequate Hypoglycemia:   never  Glucose monitoring: Using Accu-Chek Aviva  Meter  Mean values apply above for all meters except median for One Touch  PRE-MEAL Fasting Lunch Dinner  Bedtime Overall  Glucose range: 107-225  120, 129   86-119    Mean/median: 156         Self-care: The diet that the patient has been following is: tries to limit fast food .     Meal times are:  Breakfast is at 9 AM-12 pm Lunch: 2-4 PM Dinner:  6-8 PM  Typical meal intake: Breakfast is cereal             Dietician visit, most recent: 9/17               Exercise: 3/7 days, walking 1 hour on the treadmill   Weight history:  He had a gastric band in 2009 but this was loosened a couple of years ago because with taking metformin he would get uncomfortably bloated and has gained weight  Wt Readings from Last 3 Encounters:  03/20/16 298 lb (135.2 kg)  02/08/16 (!) 302 lb (137 kg)  12/23/15 (!) 310 lb (140.6 kg)    Glycemic control:    Lab Results  Component Value Date   HGBA1C 8.5 (H) 03/17/2016   HGBA1C 9.9 (H) 12/20/2015   HGBA1C 12 08/30/2015   Lab Results  Component Value Date   MICROALBUR <0.7 03/17/2016   LDLCALC 75 06/01/2015   CREATININE 1.11 03/17/2016   Lab Results  Component Value Date  MICRALBCREAT 0.4 03/17/2016    Lab on 03/17/2016  Component Date Value Ref Range Status  . Hgb A1c MFr Bld 03/17/2016 8.5* 4.6 - 6.5 % Final  . Sodium 03/17/2016 139  135 - 145 mEq/L Final  . Potassium 03/17/2016 4.3  3.5 - 5.1 mEq/L Final  . Chloride 03/17/2016 98  96 - 112 mEq/L Final  . CO2 03/17/2016 32  19 - 32 mEq/L Final  . Glucose, Bld 03/17/2016 174* 70 - 99 mg/dL Final  . BUN 16/10/9602 13  6 - 23 mg/dL Final  . Creatinine, Ser 03/17/2016 1.11  0.40 - 1.50 mg/dL Final  . Total Bilirubin 03/17/2016 0.4  0.2 - 1.2 mg/dL Final  . Alkaline Phosphatase 03/17/2016 75  39 - 117 U/L Final  . AST 03/17/2016 20  0 - 37 U/L Final  . ALT 03/17/2016 24  0 - 53 U/L Final  . Total Protein 03/17/2016 7.6  6.0 - 8.3 g/dL Final  . Albumin 54/09/8117 4.2  3.5 - 5.2 g/dL Final  . Calcium 14/78/2956 9.9  8.4 - 10.5 mg/dL Final  . GFR 21/30/8657 90.06  >60.00 mL/min Final  .  Microalb, Ur 03/17/2016 <0.7  0.0 - 1.9 mg/dL Final  . Creatinine,U 84/69/6295 172.4  mg/dL Final  . Microalb Creat Ratio 03/17/2016 0.4  0.0 - 30.0 mg/g Final     Allergies as of 03/20/2016   No Known Allergies     Medication List       Accurate as of 03/20/16  2:04 PM. Always use your most recent med list.          amLODipine 10 MG tablet Commonly known as:  NORVASC Take 10 mg by mouth daily.   aspirin EC 81 MG tablet Take 81 mg by mouth daily.   atorvastatin 80 MG tablet Commonly known as:  LIPITOR   CALTRATE 600+D PLUS MINERALS 600-800 MG-UNIT Tabs Take by mouth.   empagliflozin 10 MG Tabs tablet Commonly known as:  JARDIANCE Take 10 mg by mouth daily with breakfast.   glipiZIDE 5 MG 24 hr tablet Commonly known as:  GLUCOTROL XL Take 1 tablet (5 mg total) by mouth daily with breakfast.   glucose blood test strip Commonly known as:  BAYER CONTOUR TEST Use as instructed to check blood sugar 3 times per day   Insulin Glargine 100 UNIT/ML Solostar Pen Commonly known as:  LANTUS SOLOSTAR Inject 20 units daily   metFORMIN 500 MG 24 hr tablet Commonly known as:  GLUCOPHAGE-XR Take 4 tablets (2,000 mg total) by mouth daily with supper.   multivitamin-iron-minerals-folic acid chewable tablet Chew 1 tablet by mouth daily.   sildenafil 100 MG tablet Commonly known as:  VIAGRA Take 100 mg by mouth daily as needed for erectile dysfunction.   valsartan-hydrochlorothiazide 320-25 MG tablet Commonly known as:  DIOVAN-HCT   VICTOZA 18 MG/3ML Sopn Generic drug:  liraglutide Inject 0.3 mLs (1.8 mg total) into the skin daily. Inject once daily at the same time       Allergies: No Known Allergies  Past Medical History:  Diagnosis Date  . Diabetes mellitus   . Hyperlipidemia   . Hypertension     Past Surgical History:  Procedure Laterality Date  . LAPAROSCOPIC GASTRIC BANDING  01/29/2007    No family history on file.  Social History:  reports that he has  never smoked. He has never used smokeless tobacco. He reports that he drinks about 3.0 oz of alcohol per week . He reports that  he does not use drugs.   Review of Systems  He has had  erectile dysfunction He was able to get Viagra but he thinks this is not effective His free testosterone level was normal  Lipid history: Not on any medications and LDL is below 100    Lab Results  Component Value Date   LDLCALC 75 06/01/2015           Hypertension: Being treated with Diovan HCT and amlodipine Has been more compliant with medications recently  BP Readings from Last 3 Encounters:  03/20/16 120/80  02/08/16 (!) 134/96  12/23/15 132/86    Most recent eye exam was In 06/2014, Has not scheduled follow-up  Most recent foot exam: 9/17    LABS:  Lab on 03/17/2016  Component Date Value Ref Range Status  . Hgb A1c MFr Bld 03/17/2016 8.5* 4.6 - 6.5 % Final  . Sodium 03/17/2016 139  135 - 145 mEq/L Final  . Potassium 03/17/2016 4.3  3.5 - 5.1 mEq/L Final  . Chloride 03/17/2016 98  96 - 112 mEq/L Final  . CO2 03/17/2016 32  19 - 32 mEq/L Final  . Glucose, Bld 03/17/2016 174* 70 - 99 mg/dL Final  . BUN 16/10/9602 13  6 - 23 mg/dL Final  . Creatinine, Ser 03/17/2016 1.11  0.40 - 1.50 mg/dL Final  . Total Bilirubin 03/17/2016 0.4  0.2 - 1.2 mg/dL Final  . Alkaline Phosphatase 03/17/2016 75  39 - 117 U/L Final  . AST 03/17/2016 20  0 - 37 U/L Final  . ALT 03/17/2016 24  0 - 53 U/L Final  . Total Protein 03/17/2016 7.6  6.0 - 8.3 g/dL Final  . Albumin 54/09/8117 4.2  3.5 - 5.2 g/dL Final  . Calcium 14/78/2956 9.9  8.4 - 10.5 mg/dL Final  . GFR 21/30/8657 90.06  >60.00 mL/min Final  . Microalb, Ur 03/17/2016 <0.7  0.0 - 1.9 mg/dL Final  . Creatinine,U 84/69/6295 172.4  mg/dL Final  . Microalb Creat Ratio 03/17/2016 0.4  0.0 - 30.0 mg/g Final    Physical Examination:  BP 120/80   Pulse 85   Ht 6\' 4"  (1.93 m)   Wt 298 lb (135.2 kg)   SpO2 98%   BMI 36.27 kg/m       ASSESSMENT:  Diabetes type 2, uncontrolled, relatively long-standing and BMI of 39 See history of present illness for detailed discussion of current diabetes management, blood sugar patterns and problems identified  Although his blood sugars are relatively better with using Lantus and his A1c is coming down he still has inconsistent control based on his compliance as above Discussed his A1c of 8.5 and need to get it down to 7 He is concerned about long-term insulin use but discussed that he has had diabetes for several years and likely has insulin deficiency Overall motivation level is not good for his taking care of his needs for diabetes management  HYPERTENSION.  Blood pressure is better now   ERECTILE dysfunction: Explained that this is related to diabetes and not testosterone deficiency  PLAN:     Check blood sugars more consistently after meals  Reminded him to take his insulin every evening.  Regardless of blood sugar level  Cut back on alcohol intake  More consistent balanced diet  Increase frequency of exercise  He may leave off his glipizide to see if blood sugars are still controlled without it, likely this is helpful at this stage  Reminded him to follow-up with PCP  for day-to-day management and consideration of treatment of his depression    Patient Instructions  Check blood sugars on waking up    Also check blood sugars about 2 hours after a meal and do this after different meals by rotation  Recommended blood sugar levels on waking up is 90-130 and about 2 hours after meal is 130-160  Please bring your blood sugar monitor to each visit, thank you  Try stopping Glipizide  Less drinks at nite   Take all shots daily    Counseling time on subjects discussed above is over 50% of today's 25 minute visit   Traevon Meiring 03/20/2016, 2:04 PM   Note: This office note was prepared with Dragon voice recognition system technology. Any transcriptional errors that  result from this process are unintentional.

## 2016-03-20 NOTE — Patient Instructions (Addendum)
Check blood sugars on waking up    Also check blood sugars about 2 hours after a meal and do this after different meals by rotation  Recommended blood sugar levels on waking up is 90-130 and about 2 hours after meal is 130-160  Please bring your blood sugar monitor to each visit, thank you  Try stopping Glipizide  Less drinks at nite   Take all shots daily

## 2016-03-21 ENCOUNTER — Ambulatory Visit: Payer: BLUE CROSS/BLUE SHIELD | Admitting: Endocrinology

## 2016-04-25 ENCOUNTER — Other Ambulatory Visit: Payer: Self-pay | Admitting: Endocrinology

## 2016-04-28 ENCOUNTER — Telehealth: Payer: Self-pay | Admitting: Endocrinology

## 2016-04-28 NOTE — Telephone Encounter (Signed)
If he does not have a co-pay card he can pick up 1 Also needs to check with his insurance to see which product in the family is least expensive

## 2016-04-28 NOTE — Telephone Encounter (Signed)
Patient stated he can't afford his victoza, to expensive is there a coupon card or is it another alternative.

## 2016-04-28 NOTE — Telephone Encounter (Signed)
Left detailed message on his vm and requested a call back to discuss what is the least expensive after he has called his insurance company

## 2016-05-08 ENCOUNTER — Other Ambulatory Visit: Payer: Self-pay | Admitting: Endocrinology

## 2016-06-20 ENCOUNTER — Other Ambulatory Visit (INDEPENDENT_AMBULATORY_CARE_PROVIDER_SITE_OTHER): Payer: BLUE CROSS/BLUE SHIELD

## 2016-06-20 DIAGNOSIS — E1165 Type 2 diabetes mellitus with hyperglycemia: Secondary | ICD-10-CM | POA: Diagnosis not present

## 2016-06-20 LAB — HEMOGLOBIN A1C: HEMOGLOBIN A1C: 10.6 % — AB (ref 4.6–6.5)

## 2016-06-20 LAB — BASIC METABOLIC PANEL
BUN: 14 mg/dL (ref 6–23)
CALCIUM: 9.7 mg/dL (ref 8.4–10.5)
CO2: 34 mEq/L — ABNORMAL HIGH (ref 19–32)
Chloride: 96 mEq/L (ref 96–112)
Creatinine, Ser: 1.06 mg/dL (ref 0.40–1.50)
GFR: 94.88 mL/min (ref >60.00)
GLUCOSE: 256 mg/dL — AB (ref 70–99)
Potassium: 4.1 mEq/L (ref 3.5–5.1)
SODIUM: 137 meq/L (ref 135–145)

## 2016-06-20 LAB — LIPID PANEL
CHOL/HDL RATIO: 4
Cholesterol: 152 mg/dL (ref 0–200)
HDL: 40.5 mg/dL (ref >39.00)
NONHDL: 111.33
Triglycerides: 250 mg/dL — ABNORMAL HIGH (ref 0.0–149.0)
VLDL: 50 mg/dL — AB (ref 0.0–40.0)

## 2016-06-20 LAB — LDL CHOLESTEROL, DIRECT: LDL DIRECT: 71 mg/dL

## 2016-06-23 ENCOUNTER — Encounter: Payer: Self-pay | Admitting: Endocrinology

## 2016-06-23 ENCOUNTER — Ambulatory Visit (INDEPENDENT_AMBULATORY_CARE_PROVIDER_SITE_OTHER): Payer: BLUE CROSS/BLUE SHIELD | Admitting: Endocrinology

## 2016-06-23 VITALS — BP 110/82 | HR 74 | Ht 76.0 in | Wt 292.4 lb

## 2016-06-23 DIAGNOSIS — Z794 Long term (current) use of insulin: Secondary | ICD-10-CM | POA: Diagnosis not present

## 2016-06-23 DIAGNOSIS — E1165 Type 2 diabetes mellitus with hyperglycemia: Secondary | ICD-10-CM

## 2016-06-23 NOTE — Progress Notes (Signed)
Reason for Appointment: Follow-up for Type 2 Diabetes  Referring physician: Ehinger   History of Present Illness:          Date of diagnosis of type 2 diabetes mellitus:?  1997        Background history:   He was symptomatic at diagnosis with increased urination He has been mostly treated with metformin and glipizide in the past He was taking Januvia more recently but this was changed in 2017 2 Onglyza because of insurance preference He does not think his sugars have been consistently controlled in the past, best A1c recently appears to be in 05/2014 which was 7.5 but subsequently has been at least 8%  Recent history:   LANTUS insulin: 10 units daily  Non-insulin hypoglycemic drugs: Metformin ER 500, 4 tablets in a.m., glipizide ER 5mg  qd.,  Victoza 1.8 mg daily, Jardiance 10 mg daily   His A1c has Gone backup to 10.6, previously had come down to 8.5 He had been started on  since his visit in 01/2016  Current management, blood sugar patterns and problems identified:  He has  Not checked his blood sugars except twice in the last month  Overall has difficulty complying with his day-to-day management of diabetes partly because of financial reasons but also because of lack of motivation  He does not take his Lantus as prescribed with 20 units and not sure if he is actually taking this daily  He tends to have relatively high fasting readings  Also for the last week ran out of Jardiance and Victoza and complaining about the cost of these also  Lab glucose was 256 as a result  Not clear what his postprandial readings are  However he is drinking a lot of Gatorade and also having more thirst  He has however continued to be losing weight but this may be from high sugars  Does not appear to be motivated to be consistent with his diet also        Side effects from medications have been: none  Compliance with the medical regimen: Inadequate Hypoglycemia:    never  Glucose monitoring: Using Accu-Chek Aviva  Meter  Fasting 162, bedtime 135 last month  Self-care: The diet that the patient has been following is: tries to limit fast food .     Meal times are:  Breakfast is at 9 AM-12 pm Lunch: 2-4 PM Dinner:  6-8 PM  Typical meal intake: Breakfast is cereal             Dietician visit, most recent: 9/17               Exercise: 3/7 days, walking 1 hour on the treadmill   Weight history:  He had a gastric band in 2009 but this was loosened a couple of years ago because with taking metformin he would get uncomfortably bloated and has gained weight  Wt Readings from Last 3 Encounters:  06/23/16 292 lb 6.4 oz (132.6 kg)  03/20/16 298 lb (135.2 kg)  02/08/16 (!) 302 lb (137 kg)    Glycemic control:    Lab Results  Component Value Date   HGBA1C 10.6 (H) 06/20/2016   HGBA1C 8.5 (H) 03/17/2016   HGBA1C 9.9 (H) 12/20/2015   Lab Results  Component Value Date   MICROALBUR <0.7 03/17/2016   LDLCALC 66 03/05/2016   CREATININE 1.06 06/20/2016   Lab Results  Component Value Date   MICRALBCREAT 0.4 03/17/2016  Lab on 06/20/2016  Component Date Value Ref Range Status  . Hgb A1c MFr Bld 06/20/2016 10.6* 4.6 - 6.5 % Final   Glycemic Control Guidelines for People with Diabetes:Non Diabetic:  <6%Goal of Therapy: <7%Additional Action Suggested:  >8%   . Cholesterol 06/20/2016 152  0 - 200 mg/dL Final   ATP III Classification       Desirable:  < 200 mg/dL               Borderline High:  200 - 239 mg/dL          High:  > = 161 mg/dL  . Triglycerides 06/20/2016 250.0* 0.0 - 149.0 mg/dL Final   Normal:  <096 mg/dLBorderline High:  150 - 199 mg/dL  . HDL 06/20/2016 40.50  >39.00 mg/dL Final  . VLDL 04/54/0981 50.0* 0.0 - 40.0 mg/dL Final  . Total CHOL/HDL Ratio 06/20/2016 4   Final                  Men          Women1/2 Average Risk     3.4          3.3Average Risk          5.0          4.42X Average Risk          9.6          7.13X Average Risk           15.0          11.0                      . NonHDL 06/20/2016 111.33   Final   NOTE:  Non-HDL goal should be 30 mg/dL higher than patient's LDL goal (i.e. LDL goal of < 70 mg/dL, would have non-HDL goal of < 100 mg/dL)  . Sodium 06/20/2016 137  135 - 145 mEq/L Final  . Potassium 06/20/2016 4.1  3.5 - 5.1 mEq/L Final  . Chloride 06/20/2016 96  96 - 112 mEq/L Final  . CO2 06/20/2016 34* 19 - 32 mEq/L Final  . Glucose, Bld 06/20/2016 256* 70 - 99 mg/dL Final  . BUN 19/14/7829 14  6 - 23 mg/dL Final  . Creatinine, Ser 06/20/2016 1.06  0.40 - 1.50 mg/dL Final  . Calcium 56/21/3086 9.7  8.4 - 10.5 mg/dL Final  . GFR 57/84/6962 94.88  >60.00 mL/min Final  . Direct LDL 06/20/2016 71.0  mg/dL Final   Optimal:  <952 mg/dLNear or Above Optimal:  100-129 mg/dLBorderline High:  130-159 mg/dLHigh:  160-189 mg/dLVery High:  >190 mg/dL     Allergies as of 08/20/1322   No Known Allergies     Medication List       Accurate as of 06/23/16  3:00 PM. Always use your most recent med list.          amLODipine 10 MG tablet Commonly known as:  NORVASC Take 10 mg by mouth daily.   aspirin EC 81 MG tablet Take 81 mg by mouth daily.   atorvastatin 80 MG tablet Commonly known as:  LIPITOR   CALTRATE 600+D PLUS MINERALS 600-800 MG-UNIT Tabs Take by mouth.   glipiZIDE 5 MG 24 hr tablet Commonly known as:  GLUCOTROL XL Take 1 tablet (5 mg total) by mouth daily with breakfast.   glucose blood test strip Commonly known as:  BAYER CONTOUR TEST Use as instructed to check blood sugar 3  times per day   Insulin Glargine 100 UNIT/ML Solostar Pen Commonly known as:  LANTUS SOLOSTAR Inject 20 units daily   JARDIANCE 10 MG Tabs tablet Generic drug:  empagliflozin TAKE 1 TABLET BY MOUTH DAILY WITH BREAKFAST   metFORMIN 500 MG 24 hr tablet Commonly known as:  GLUCOPHAGE-XR Take 4 tablets (2,000 mg total) by mouth daily with supper.   multivitamin-iron-minerals-folic acid chewable tablet Chew 1  tablet by mouth daily.   sildenafil 100 MG tablet Commonly known as:  VIAGRA Take 100 mg by mouth daily as needed for erectile dysfunction.   valsartan-hydrochlorothiazide 320-25 MG tablet Commonly known as:  DIOVAN-HCT   VICTOZA 18 MG/3ML Sopn Generic drug:  liraglutide INJECT 0.3 MLS (1.8 MG TOTAL) INTO THE SKIN DAILY. INJECT ONCE DAILY AT THE SAME TIME       Allergies: No Known Allergies  Past Medical History:  Diagnosis Date  . Diabetes mellitus   . Hyperlipidemia   . Hypertension     Past Surgical History:  Procedure Laterality Date  . LAPAROSCOPIC GASTRIC BANDING  01/29/2007    No family history on file.  Social History:  reports that he has never smoked. He has never used smokeless tobacco. He reports that he drinks about 3.0 oz of alcohol per week . He reports that he does not use drugs.   Review of Systems  He has had  erectile dysfunction His free testosterone level was normal  Lipid history: Not on any medications and LDL is below 100, Recently triglycerides higher    Lab Results  Component Value Date   CHOL 152 06/20/2016   HDL 40.50 06/20/2016   LDLCALC 66 03/05/2016   LDLDIRECT 71.0 06/20/2016   TRIG 250.0 (H) 06/20/2016   CHOLHDL 4 06/20/2016           Hypertension: Being treated with Diovan HCT and amlodipine, He thinks is compliant with these This is also followed by PCP   BP Readings from Last 3 Encounters:  06/23/16 110/82  03/20/16 120/80  02/08/16 (!) 134/96    Most recent eye exam was In 06/2014, Has not scheduled follow-up  Most recent foot exam: 9/17    LABS:  Lab on 06/20/2016  Component Date Value Ref Range Status  . Hgb A1c MFr Bld 06/20/2016 10.6* 4.6 - 6.5 % Final   Glycemic Control Guidelines for People with Diabetes:Non Diabetic:  <6%Goal of Therapy: <7%Additional Action Suggested:  >8%   . Cholesterol 06/20/2016 152  0 - 200 mg/dL Final   ATP III Classification       Desirable:  < 200 mg/dL                Borderline High:  200 - 239 mg/dL          High:  > = 782 mg/dL  . Triglycerides 06/20/2016 250.0* 0.0 - 149.0 mg/dL Final   Normal:  <956 mg/dLBorderline High:  150 - 199 mg/dL  . HDL 06/20/2016 40.50  >39.00 mg/dL Final  . VLDL 21/30/8657 50.0* 0.0 - 40.0 mg/dL Final  . Total CHOL/HDL Ratio 06/20/2016 4   Final                  Men          Women1/2 Average Risk     3.4          3.3Average Risk          5.0          4.42X Average  Risk          9.6          7.13X Average Risk          15.0          11.0                      . NonHDL 06/20/2016 111.33   Final   NOTE:  Non-HDL goal should be 30 mg/dL higher than patient's LDL goal (i.e. LDL goal of < 70 mg/dL, would have non-HDL goal of < 100 mg/dL)  . Sodium 06/20/2016 137  135 - 145 mEq/L Final  . Potassium 06/20/2016 4.1  3.5 - 5.1 mEq/L Final  . Chloride 06/20/2016 96  96 - 112 mEq/L Final  . CO2 06/20/2016 34* 19 - 32 mEq/L Final  . Glucose, Bld 06/20/2016 256* 70 - 99 mg/dL Final  . BUN 40/98/119106/05/2016 14  6 - 23 mg/dL Final  . Creatinine, Ser 06/20/2016 1.06  0.40 - 1.50 mg/dL Final  . Calcium 47/82/956206/05/2016 9.7  8.4 - 10.5 mg/dL Final  . GFR 13/08/657806/05/2016 94.88  >60.00 mL/min Final  . Direct LDL 06/20/2016 71.0  mg/dL Final   Optimal:  <469<100 mg/dLNear or Above Optimal:  100-129 mg/dLBorderline High:  130-159 mg/dLHigh:  160-189 mg/dLVery High:  >190 mg/dL    Physical Examination:  BP 110/82 (Cuff Size: Normal)   Pulse 74   Ht 6\' 4"  (1.93 m)   Wt 292 lb 6.4 oz (132.6 kg)   SpO2 96%   BMI 35.59 kg/m     ASSESSMENT:  Diabetes type 2, uncontrolled, relatively long-standing and BMI of 39 See history of present illness for detailed discussion of current diabetes management, blood sugar patterns and problems identified  His A1c is now up to 10.6  Blood sugars are getting progressively higher with not following his treatment regimen especially with diet, consistent use of medications and using prescribed dose of Lantus Again difficult to  motivate him to be consistent His weight may be coming down because of higher blood sugars He is not taking enough Lantus insulin and fasting readings are probably high Also lab glucose was 256 when he ran out of Jardiance and Victoza Still having issues with financial difficulties despite giving him to pay cards for all his medications  HYPERTENSION.  Blood pressure is slightly high, needs to follow-up with PCP   PLAN:     Check blood sugars more consistently at least every other day including after meals  Reminded him to take his insulin every evening consistently and not to reduce the dose, also discussed that he may need more than 20 units of fasting readings are consistently over 130  Cut back on Gatorade and other sugar intake  More consistent balanced diet  He will recheck his blood sugars with Accu-Chek but this is expensive he can get the [employer] brand him alternatively he can check the best covered brand from his insurance  Discussed blood sugar targets both morning and after meals Needs regular eye exams  Follow-up in 6 weeks to reassess his progress  Patient Instructions  Check coverage for strips or [employer] Prime  No Gatorade   Check blood sugars on waking up 4/7 days   Also check blood sugars about 2 hours after a meal and do this after different meals by rotation  Recommended blood sugar levels on waking up is 90-130 and about 2 hours after meal is 130-160  Please bring your  blood sugar monitor to each visit, thank you  Lantus 20 units        Daimion Adamcik 06/23/2016, 3:00 PM   Note: This office note was prepared with Insurance underwriter. Any transcriptional errors that result from this process are unintentional.

## 2016-06-23 NOTE — Patient Instructions (Addendum)
Check coverage for strips or [employer] Prime  No Gatorade   Check blood sugars on waking up 4/7 days   Also check blood sugars about 2 hours after a meal and do this after different meals by rotation  Recommended blood sugar levels on waking up is 90-130 and about 2 hours after meal is 130-160  Please bring your blood sugar monitor to each visit, thank you  Lantus 20 units

## 2016-08-04 ENCOUNTER — Other Ambulatory Visit (INDEPENDENT_AMBULATORY_CARE_PROVIDER_SITE_OTHER): Payer: BLUE CROSS/BLUE SHIELD

## 2016-08-04 ENCOUNTER — Telehealth: Payer: Self-pay | Admitting: Endocrinology

## 2016-08-04 DIAGNOSIS — Z794 Long term (current) use of insulin: Secondary | ICD-10-CM

## 2016-08-04 DIAGNOSIS — E1165 Type 2 diabetes mellitus with hyperglycemia: Secondary | ICD-10-CM | POA: Diagnosis not present

## 2016-08-04 LAB — BASIC METABOLIC PANEL
BUN: 11 mg/dL (ref 6–23)
CALCIUM: 9.6 mg/dL (ref 8.4–10.5)
CHLORIDE: 98 meq/L (ref 96–112)
CO2: 32 meq/L (ref 19–32)
Creatinine, Ser: 1.02 mg/dL (ref 0.40–1.50)
GFR: 99.14 mL/min (ref >60.00)
Glucose, Bld: 178 mg/dL — ABNORMAL HIGH (ref 70–99)
Potassium: 3.8 mEq/L (ref 3.5–5.1)
SODIUM: 141 meq/L (ref 135–145)

## 2016-08-04 MED ORDER — VICTOZA 18 MG/3ML ~~LOC~~ SOPN: 1.8000 mg | PEN_INJECTOR | Freq: Every day | SUBCUTANEOUS | 2 refills | Status: DC

## 2016-08-04 NOTE — Telephone Encounter (Signed)
**  Remind patient they can make refill requests via MyChart**  Medication refill request (Name & Dosage):  VICTOZA 18 MG/3ML SOPN  [facility] & [facility]  [facility] [id number] [place], [place] - [address] [phone] (Phone) [phone] (Fax)    Other comments (if applicable):

## 2016-08-04 NOTE — Telephone Encounter (Signed)
Refill submitted. 

## 2016-08-05 LAB — FRUCTOSAMINE: Fructosamine: 323 umol/L — ABNORMAL HIGH (ref 0–285)

## 2016-08-06 NOTE — Progress Notes (Signed)
Reason for Appointment: Follow-up for Type 2 Diabetes  Referring physician: Ehinger   History of Present Illness:          Date of diagnosis of type 2 diabetes mellitus:?  1997        Background history:   Gregory Brady was symptomatic at diagnosis with increased urination Gregory Brady has been mostly treated with metformin and glipizide in the past Gregory Brady was taking Januvia more recently but this was changed in 2017 2 Onglyza because of insurance preference Gregory Brady does not think his sugars have been consistently controlled in the past, best A1c recently appears to be in 05/2014 which was 7.5 but subsequently has been at least 8%  Recent history:   LANTUS insulin: 20 units daily  Non-insulin hypoglycemic drugs: Metformin ER 500, 4 tablets in a.m.,   Victoza 1.8 mg daily, Jardiance 10 mg daily   His A1c was 10.6 in June Fructosamine is still relatively high at 323  Current management, blood sugar patterns and problems identified:  Gregory Brady has  checked only 3 blood sugars at home on his Accu-Chek, Gregory Brady was also told Gregory Brady could use a [employer] brand meter but Gregory Brady has not done so  Also not checking readings after meals  Lab glucose was 178 with his home glucose 145 in the morning and Gregory Brady is not sure if his test strips are expired are not  Again has difficulty complying with his day-to-day management of diabetes partly because of financial reasons but alsosince Gregory Brady thinks Gregory Brady tends to get depressed  Gregory Brady was trying to exercise on the treadmill but has not done any now  Gregory Brady has however started taking 20 units of Lantus more regularly now  Not clear what his postprandial readings are Despite taking Victoza and Jardiance   However Gregory Brady is drinking  less of Gatorade recently  Weight has gone up somewhat  Gregory Brady says Gregory Brady is following his diet only about 50% of the time        Side effects from medications have been: none  Compliance with the medical regimen: Inadequate Hypoglycemia:   never  Glucose monitoring:  Using Accu-Chek Aviva  Meter  Fasting 162, bedtime 135 last month  Self-care: The diet that the patient has been following is: tries to limit fast food .     Meal times are:  Breakfast is at 9 AM-12 pm Lunch: 2-4 PM Dinner:  6-8 PM  Typical meal intake: Breakfast is cereal             Dietician visit, most recent: 9/17               Exercise: 0/7 days, walking 1 hour on the treadmill   Weight history:   Gregory Brady had a gastric band in 2009 but this was loosened a couple of years ago because with taking metformin Gregory Brady would get uncomfortably bloated and has gained weight  Wt Readings from Last 3 Encounters:  08/07/16 289 lb 6.4 oz (131.3 kg)  06/23/16 292 lb 6.4 oz (132.6 kg)  03/20/16 298 lb (135.2 kg)    Glycemic control:    Lab Results  Component Value Date   HGBA1C 10.6 (H) 06/20/2016   HGBA1C 8.5 (H) 03/17/2016   HGBA1C 9.9 (H) 12/20/2015   Lab Results  Component Value Date   MICROALBUR <0.7 03/17/2016   LDLCALC 66 03/05/2016   CREATININE 1.02 08/04/2016   Lab Results  Component Value Date   MICRALBCREAT 0.4 03/17/2016  Lab on 08/04/2016  Component Date Value Ref Range Status  . Sodium 08/04/2016 141  135 - 145 mEq/L Final  . Potassium 08/04/2016 3.8  3.5 - 5.1 mEq/L Final  . Chloride 08/04/2016 98  96 - 112 mEq/L Final  . CO2 08/04/2016 32  19 - 32 mEq/L Final  . Glucose, Bld 08/04/2016 178* 70 - 99 mg/dL Final  . BUN 16/10/960407/20/2018 11  6 - 23 mg/dL Final  . Creatinine, Ser 08/04/2016 1.02  0.40 - 1.50 mg/dL Final  . Calcium 54/09/811907/20/2018 9.6  8.4 - 10.5 mg/dL Final  . GFR 14/78/295607/20/2018 99.14  >60.00 mL/min Final  . Fructosamine 08/04/2016 323* 0 - 285 umol/L Final   Comment: Published reference interval for apparently healthy subjects between age 920 and 3960 is 56205 - 285 umol/L and in a poorly controlled diabetic population is 228 - 563 umol/L with a mean of 396 umol/L.      Allergies as of 08/07/2016   No Known Allergies     Medication List       Accurate as of  08/07/16 12:01 PM. Always use your most recent med list.          amLODipine 10 MG tablet Commonly known as:  NORVASC Take 10 mg by mouth daily.   aspirin EC 81 MG tablet Take 81 mg by mouth daily.   atorvastatin 80 MG tablet Commonly known as:  LIPITOR   CALTRATE 600+D PLUS MINERALS 600-800 MG-UNIT Tabs Take by mouth.   Insulin Glargine 100 UNIT/ML Solostar Pen Commonly known as:  LANTUS SOLOSTAR Inject 20 units daily   JARDIANCE 10 MG Tabs tablet Generic drug:  empagliflozin TAKE 1 TABLET BY MOUTH DAILY WITH BREAKFAST   metFORMIN 500 MG 24 hr tablet Commonly known as:  GLUCOPHAGE-XR Take 4 tablets (2,000 mg total) by mouth daily with supper.   multivitamin-iron-minerals-folic acid chewable tablet Chew 1 tablet by mouth daily.   sildenafil 100 MG tablet Commonly known as:  VIAGRA Take 100 mg by mouth daily as needed for erectile dysfunction.   valsartan-hydrochlorothiazide 320-25 MG tablet Commonly known as:  DIOVAN-HCT   VICTOZA 18 MG/3ML Sopn Generic drug:  liraglutide Inject 0.3 mLs (1.8 mg total) into the skin daily. Inject once daily at the same time       Allergies: No Known Allergies  Past Medical History:  Diagnosis Date  . Diabetes mellitus   . Hyperlipidemia   . Hypertension     Past Surgical History:  Procedure Laterality Date  . LAPAROSCOPIC GASTRIC BANDING  01/29/2007    No family history on file.  Social History:  reports that Gregory Brady has never smoked. Gregory Brady has never used smokeless tobacco. Gregory Brady reports that Gregory Brady drinks about 3.0 oz of alcohol per week . Gregory Brady reports that Gregory Brady does not use drugs.   Review of Systems  Gregory Brady has had  erectile dysfunction His free testosterone level was normal  Lipid history: Not on any medications and LDL is below 100, Recently triglycerides higher    Lab Results  Component Value Date   CHOL 152 06/20/2016   HDL 40.50 06/20/2016   LDLCALC 66 03/05/2016   LDLDIRECT 71.0 06/20/2016   TRIG 250.0 (H) 06/20/2016    CHOLHDL 4 06/20/2016           Hypertension: Being treated with Diovan HCT and amlodipine Gregory Brady thinks valsartan was replaced by another medication but cannot find this on his record This is also followed by PCP   BP Readings from Last  3 Encounters:  08/07/16 118/88  06/23/16 110/82  03/20/16 120/80    Most recent eye exam was In 06/2014, Has not scheduled follow-up  Most recent foot exam: 9/17    LABS:  Lab on 08/04/2016  Component Date Value Ref Range Status  . Sodium 08/04/2016 141  135 - 145 mEq/L Final  . Potassium 08/04/2016 3.8  3.5 - 5.1 mEq/L Final  . Chloride 08/04/2016 98  96 - 112 mEq/L Final  . CO2 08/04/2016 32  19 - 32 mEq/L Final  . Glucose, Bld 08/04/2016 178* 70 - 99 mg/dL Final  . BUN 40/98/1191 11  6 - 23 mg/dL Final  . Creatinine, Ser 08/04/2016 1.02  0.40 - 1.50 mg/dL Final  . Calcium 47/82/9562 9.6  8.4 - 10.5 mg/dL Final  . GFR 13/08/6576 99.14  >60.00 mL/min Final  . Fructosamine 08/04/2016 323* 0 - 285 umol/L Final   Comment: Published reference interval for apparently healthy subjects between age 29 and 17 is 34 - 285 umol/L and in a poorly controlled diabetic population is 228 - 563 umol/L with a mean of 396 umol/L.     Physical Examination:  BP 118/88   Pulse 75   Ht 6\' 4"  (1.93 m)   Wt 289 lb 6.4 oz (131.3 kg)   SpO2 96%   BMI 35.23 kg/m     ASSESSMENT:  Diabetes type 2, uncontrolled, relatively long-standing and BMI of 39 See history of present illness for detailed discussion of current diabetes management, blood sugar patterns and problems identified  Although his blood sugars are appearing better and his compliance is improving.  Still not doing things optimally His fructosamine indicates inadequate control Gregory Brady can still do better with diet, starting exercise, better monitoring Needs weight loss  HYPERTENSION.  Blood pressure is slightly Better with restarting Jardiance   PLAN:     Check blood sugars more consistently   with some readings after meals  Gregory Brady can start using the Accu-Chek meter but Gregory Brady needs to start keeping a record  Gregory Brady will continue to improve his diet with cutting back on fats, carbohydrates and drinks containing more sugar  Regular exercise  Jardiance 20 mg daily and then go to 25 mg daily No change in insulin 20 units as this may be adequate for his fasting readings  Follow-up in 8 weeks to reassess his progress  Patient Instructions  Check blood sugars on waking up 3/7  Also check blood sugars about 2 hours after a meal and do this after different meals by rotation  Recommended blood sugar levels on waking up is 90-130 and about 2 hours after meal is 130-160  Please bring your blood sugar monitor to each visit, thank you  Jardiance 2 daily  Exercise daily      Gregory Brady 08/07/2016, 12:01 PM   Note: This office note was prepared with Dragon voice recognition system technology. Any transcriptional errors that result from this process are unintentional.

## 2016-08-07 ENCOUNTER — Encounter: Payer: Self-pay | Admitting: Endocrinology

## 2016-08-07 ENCOUNTER — Ambulatory Visit (INDEPENDENT_AMBULATORY_CARE_PROVIDER_SITE_OTHER): Payer: BLUE CROSS/BLUE SHIELD | Admitting: Endocrinology

## 2016-08-07 VITALS — BP 118/88 | HR 75 | Ht 76.0 in | Wt 289.4 lb

## 2016-08-07 DIAGNOSIS — E1165 Type 2 diabetes mellitus with hyperglycemia: Secondary | ICD-10-CM

## 2016-08-07 DIAGNOSIS — Z794 Long term (current) use of insulin: Secondary | ICD-10-CM

## 2016-08-07 MED ORDER — EMPAGLIFLOZIN 25 MG PO TABS: 25.0000 mg | ORAL_TABLET | Freq: Every day | ORAL | 3 refills | Status: DC

## 2016-08-07 MED ORDER — GLUCOSE BLOOD VI STRP: ORAL_STRIP | 1 refills | Status: DC

## 2016-08-07 NOTE — Patient Instructions (Addendum)
Check blood sugars on waking up 3/7  Also check blood sugars about 2 hours after a meal and do this after different meals by rotation  Recommended blood sugar levels on waking up is 90-130 and about 2 hours after meal is 130-160  Please bring your blood sugar monitor to each visit, thank you  Jardiance 2 daily  Exercise daily

## 2016-08-30 ENCOUNTER — Telehealth: Payer: Self-pay | Admitting: Endocrinology

## 2016-08-30 NOTE — Telephone Encounter (Signed)
Left vm requesting that the patient call his insurance company to see what is covered because they are no longer covering accu-chek aviva plus

## 2016-08-30 NOTE — Telephone Encounter (Signed)
Patient called in reference to picking up Rx for glucose blood (ACCU-CHEK AVIVA PLUS) test strip too late and pharmacy stating they need to speak with Dr. Lucianne MussKumar prior to refilling. Please call pharmacy and advise.  [facility] [address] - [place], [place] - [address] [phone] (Fax)

## 2016-09-04 ENCOUNTER — Telehealth: Payer: Self-pay

## 2016-09-04 ENCOUNTER — Other Ambulatory Visit: Payer: Self-pay

## 2016-09-04 NOTE — Telephone Encounter (Signed)
Spoke with the patient and he stated he has a meter and strips that he is borrowing from a friend and does not need anything at this time- I requested that he still call the insurance company when he gets a chance to find out what is covered so that we have a record of it in his chart for further prescriptions- patient stated an understanding and agreed to call back with that information

## 2016-09-04 NOTE — Telephone Encounter (Signed)
This has been resolved patient has a [employer]ayer contour meter and does not need anything at this time

## 2016-09-04 NOTE — Telephone Encounter (Signed)
Resolved

## 2016-09-04 NOTE — Telephone Encounter (Signed)
Left vm requesting patient call his insurance company and find out what meter is covered so that I can order it and test strips

## 2016-09-04 NOTE — Telephone Encounter (Signed)
Patient returning phone call, transferred to [facility]

## 2016-09-04 NOTE — Telephone Encounter (Signed)
Attempted to contact pharmacy using the number given to me by the pharmacy and was unable to talk to anyone after waiting on hold for 38 minutes- need patient to find out what meter is covered so that we can get new prescription for him

## 2016-10-04 ENCOUNTER — Other Ambulatory Visit: Payer: Self-pay | Admitting: Endocrinology

## 2016-10-04 DIAGNOSIS — E1165 Type 2 diabetes mellitus with hyperglycemia: Secondary | ICD-10-CM

## 2016-10-04 DIAGNOSIS — Z794 Long term (current) use of insulin: Principal | ICD-10-CM

## 2016-10-05 ENCOUNTER — Other Ambulatory Visit (INDEPENDENT_AMBULATORY_CARE_PROVIDER_SITE_OTHER): Payer: BLUE CROSS/BLUE SHIELD

## 2016-10-05 DIAGNOSIS — E1165 Type 2 diabetes mellitus with hyperglycemia: Secondary | ICD-10-CM | POA: Diagnosis not present

## 2016-10-05 DIAGNOSIS — Z794 Long term (current) use of insulin: Secondary | ICD-10-CM

## 2016-10-05 LAB — BASIC METABOLIC PANEL
BUN: 12 mg/dL (ref 6–23)
CALCIUM: 10.1 mg/dL (ref 8.4–10.5)
CO2: 35 mEq/L — ABNORMAL HIGH (ref 19–32)
Chloride: 98 mEq/L (ref 96–112)
Creatinine, Ser: 1.04 mg/dL (ref 0.40–1.50)
GFR: 96.87 mL/min (ref >60.00)
Glucose, Bld: 146 mg/dL — ABNORMAL HIGH (ref 70–99)
POTASSIUM: 4.4 meq/L (ref 3.5–5.1)
SODIUM: 141 meq/L (ref 135–145)

## 2016-10-05 LAB — HEMOGLOBIN A1C: Hgb A1c MFr Bld: 8.5 % — ABNORMAL HIGH (ref 4.6–6.5)

## 2016-10-09 ENCOUNTER — Ambulatory Visit (INDEPENDENT_AMBULATORY_CARE_PROVIDER_SITE_OTHER): Payer: BLUE CROSS/BLUE SHIELD | Admitting: Endocrinology

## 2016-10-09 ENCOUNTER — Encounter: Payer: Self-pay | Admitting: Endocrinology

## 2016-10-09 VITALS — BP 124/80 | HR 70 | Ht 76.0 in | Wt 288.8 lb

## 2016-10-09 DIAGNOSIS — E1165 Type 2 diabetes mellitus with hyperglycemia: Secondary | ICD-10-CM | POA: Diagnosis not present

## 2016-10-09 DIAGNOSIS — Z794 Long term (current) use of insulin: Secondary | ICD-10-CM

## 2016-10-09 NOTE — Progress Notes (Signed)
Reason for Appointment: Follow-up for Type 2 Diabetes  Referring physician: Ehinger   History of Present Illness:          Date of diagnosis of type 2 diabetes mellitus:?  1997        Background history:   He was symptomatic at diagnosis with increased urination He has been mostly treated with metformin and glipizide in the past He was taking Januvia more recently but this was changed in 2017 2 Onglyza because of insurance preference He does not think his sugars have been consistently controlled in the past, best A1c recently appears to be in 05/2014 which was 7.5 but subsequently has been at least 8%  Recent history:   LANTUS insulin: 20 units daily  Non-insulin hypoglycemic drugs: Metformin ER 500, 4 tablets in a.m.,   Victoza 1.8 mg daily, Jardiance 10 mg daily   His A1c was 10.6 in June and is now 8.5  Current management, blood sugar patterns and problems identified:  He has  checked some more blood sugars than before with a new contour meter  However not checking after meals and only sporadically in the morning  Not clear why some mornings his blood sugars are higher  Despite increasing his JARDIANCE to 25 mg his weight is not improving  He has had difficulty with being consistent with his diet in the past and has been asked to improve this  Also does not take time to exercise except once or twice a week, he says he is working 2 jobs sometimes  FASTING blood sugars are averaging about 150, not clear if he has high readings after meals        Side effects from medications have been: none  Compliance with the medical regimen: Inadequate Hypoglycemia:   never  Glucose monitoring: Using contour meter  FASTING blood sugars 121-172 with average about 150, evening 133-156, not after meals   Self-care: The diet that the patient has been following is: tries to limit fast food .     Meal times are:  Breakfast is at 9 AM-12 pm Lunch: 2-4 PM Dinner:  6-8 PM              Dietician visit, most recent: 9/17               Exercise: 2/7 days, walking 1 hour on the treadmill   Weight history:   He had a gastric band in 2009 but this was loosened a couple of years ago because with taking metformin he would get uncomfortably bloated and has gained weight  Wt Readings from Last 3 Encounters:  10/09/16 288 lb 12.8 oz (131 kg)  08/07/16 289 lb 6.4 oz (131.3 kg)  06/23/16 292 lb 6.4 oz (132.6 kg)    Glycemic control:    Lab Results  Component Value Date   HGBA1C 8.5 (H) 10/05/2016   HGBA1C 10.6 (H) 06/20/2016   HGBA1C 8.5 (H) 03/17/2016   Lab Results  Component Value Date   MICROALBUR <0.7 03/17/2016   LDLCALC 66 03/05/2016   CREATININE 1.04 10/05/2016   Lab Results  Component Value Date   MICRALBCREAT 0.4 03/17/2016    Lab on 10/05/2016  Component Date Value Ref Range Status  . Hgb A1c MFr Bld 10/05/2016 8.5* 4.6 - 6.5 % Final   Glycemic Control Guidelines for People with Diabetes:Non Diabetic:  <6%Goal of Therapy: <7%Additional Action Suggested:  >8%   . Sodium 10/05/2016 141  135 - 145 mEq/L Final  . Potassium 10/05/2016 4.4  3.5 - 5.1 mEq/L Final  . Chloride 10/05/2016 98  96 - 112 mEq/L Final  . CO2 10/05/2016 35* 19 - 32 mEq/L Final  . Glucose, Bld 10/05/2016 146* 70 - 99 mg/dL Final  . BUN 16/10/9602 12  6 - 23 mg/dL Final  . Creatinine, Ser 10/05/2016 1.04  0.40 - 1.50 mg/dL Final  . Calcium 54/09/8117 10.1  8.4 - 10.5 mg/dL Final  . GFR 14/78/2956 96.87  >60.00 mL/min Final     Allergies as of 10/09/2016   No Known Allergies     Medication List       Accurate as of 10/09/16 12:42 PM. Always use your most recent med list.          amLODipine 10 MG tablet Commonly known as:  NORVASC Take 10 mg by mouth daily.   aspirin EC 81 MG tablet Take 81 mg by mouth daily.   atorvastatin 80 MG tablet Commonly known as:  LIPITOR   CALTRATE 600+D PLUS MINERALS 600-800 MG-UNIT Tabs Take by mouth.   empagliflozin 25 MG  Tabs tablet Commonly known as:  JARDIANCE Take 25 mg by mouth daily.   Insulin Glargine 100 UNIT/ML Solostar Pen Commonly known as:  LANTUS SOLOSTAR Inject 20 units daily   metFORMIN 500 MG 24 hr tablet Commonly known as:  GLUCOPHAGE-XR Take 4 tablets (2,000 mg total) by mouth daily with supper.   multivitamin-iron-minerals-folic acid chewable tablet Chew 1 tablet by mouth daily.   sildenafil 100 MG tablet Commonly known as:  VIAGRA Take 100 mg by mouth daily as needed for erectile dysfunction.   valsartan-hydrochlorothiazide 320-25 MG tablet Commonly known as:  DIOVAN-HCT   VICTOZA 18 MG/3ML Sopn Generic drug:  liraglutide Inject 0.3 mLs (1.8 mg total) into the skin daily. Inject once daily at the same time       Allergies: No Known Allergies  Past Medical History:  Diagnosis Date  . Diabetes mellitus   . Hyperlipidemia   . Hypertension     Past Surgical History:  Procedure Laterality Date  . LAPAROSCOPIC GASTRIC BANDING  01/29/2007    No family history on file.  Social History:  reports that he has never smoked. He has never used smokeless tobacco. He reports that he drinks about 3.0 oz of alcohol per week . He reports that he does not use drugs.   Review of Systems   Lipid history: Not on any medications and LDL is below 100, Last triglycerides higher    Lab Results  Component Value Date   CHOL 152 06/20/2016   HDL 40.50 06/20/2016   LDLCALC 66 03/05/2016   LDLDIRECT 71.0 06/20/2016   TRIG 250.0 (H) 06/20/2016   CHOLHDL 4 06/20/2016           Hypertension:   Being treated by his primary care  BP Readings from Last 3 Encounters:  10/09/16 124/80  08/07/16 118/88  06/23/16 110/82    Most recent eye exam was In 06/2014, Has not scheduled follow-up  Most recent foot exam: 9/17    LABS:  Lab on 10/05/2016  Component Date Value Ref Range Status  . Hgb A1c MFr Bld 10/05/2016 8.5* 4.6 - 6.5 % Final   Glycemic Control Guidelines for People  with Diabetes:Non Diabetic:  <6%Goal of Therapy: <7%Additional Action Suggested:  >8%   . Sodium 10/05/2016 141  135 - 145 mEq/L Final  . Potassium 10/05/2016 4.4  3.5 - 5.1 mEq/L  Final  . Chloride 10/05/2016 98  96 - 112 mEq/L Final  . CO2 10/05/2016 35* 19 - 32 mEq/L Final  . Glucose, Bld 10/05/2016 146* 70 - 99 mg/dL Final  . BUN 16/10/9602 12  6 - 23 mg/dL Final  . Creatinine, Ser 10/05/2016 1.04  0.40 - 1.50 mg/dL Final  . Calcium 54/09/8117 10.1  8.4 - 10.5 mg/dL Final  . GFR 14/78/2956 96.87  >60.00 mL/min Final    Physical Examination:  BP 124/80   Pulse 70   Ht  (1.93 m)   Wt 288 lb 12.8 oz (131 kg)   SpO2 93%   BMI 35.15 kg/m     ASSESSMENT:  Diabetes type 2, uncontrolled, relatively long-standing and BMI of 39 See history of present illness for detailed discussion of current diabetes management, blood sugar patterns and problems identified  His A1c is improving but still not at target With increasing Jardiance his blood sugars maybe better with his still has high readings in the mornings at times Most likely this is related to inconsistent diet in the evenings and he is not monitoring after meals This is despite his taking Victoza 1.8 mg  He still not motivated to exercise  Needs weight loss  HYPERTENSION.  Blood pressure is now controlled more consistently Probably benefiting from Jardiance also   PLAN:     Check blood sugars more consistently  after meals especially evening meal  No change in insulin unless his fasting readings are low normal and discussed that he needs to continue this regularly  He will continue to improve his diet with weight loss and mind  Regular exercise, he can start walking while at work such as in the parking lot Discussed need for potentially using mealtime insulin if postprandial readings are consistently high  Follow-up in 3 months for another A1c  Patient Instructions  More sugar after supper  More  walking     [facility] 10/09/2016, 12:42 PM   Note: This office note was prepared with Dragon voice recognition system technology. Any transcriptional errors that result from this process are unintentional.

## 2016-10-09 NOTE — Patient Instructions (Addendum)
More sugar after supper  More walking

## 2017-01-03 ENCOUNTER — Other Ambulatory Visit (INDEPENDENT_AMBULATORY_CARE_PROVIDER_SITE_OTHER): Payer: BLUE CROSS/BLUE SHIELD

## 2017-01-03 DIAGNOSIS — E1165 Type 2 diabetes mellitus with hyperglycemia: Secondary | ICD-10-CM | POA: Diagnosis not present

## 2017-01-03 DIAGNOSIS — Z794 Long term (current) use of insulin: Secondary | ICD-10-CM | POA: Diagnosis not present

## 2017-01-03 LAB — COMPREHENSIVE METABOLIC PANEL
ALT: 15 U/L (ref 0–53)
AST: 15 U/L (ref 0–37)
Albumin: 4.5 g/dL (ref 3.5–5.2)
Alkaline Phosphatase: 88 U/L (ref 39–117)
BILIRUBIN TOTAL: 0.6 mg/dL (ref 0.2–1.2)
BUN: 13 mg/dL (ref 6–23)
CALCIUM: 9.9 mg/dL (ref 8.4–10.5)
CO2: 36 meq/L — AB (ref 19–32)
Chloride: 96 mEq/L (ref 96–112)
Creatinine, Ser: 0.93 mg/dL (ref 0.40–1.50)
GFR: 110.11 mL/min (ref >60.00)
GLUCOSE: 134 mg/dL — AB (ref 70–99)
POTASSIUM: 4.2 meq/L (ref 3.5–5.1)
Sodium: 140 mEq/L (ref 135–145)
Total Protein: 7.9 g/dL (ref 6.0–8.3)

## 2017-01-03 LAB — HEMOGLOBIN A1C: Hgb A1c MFr Bld: 8.7 % — ABNORMAL HIGH (ref 4.6–6.5)

## 2017-01-06 ENCOUNTER — Other Ambulatory Visit: Payer: Self-pay | Admitting: Endocrinology

## 2017-01-12 ENCOUNTER — Other Ambulatory Visit: Payer: BLUE CROSS/BLUE SHIELD

## 2017-01-15 ENCOUNTER — Ambulatory Visit: Payer: BLUE CROSS/BLUE SHIELD | Admitting: Endocrinology

## 2017-01-15 ENCOUNTER — Encounter: Payer: Self-pay | Admitting: Endocrinology

## 2017-01-15 VITALS — BP 114/76 | HR 74 | Ht 76.0 in | Wt 289.2 lb

## 2017-01-15 DIAGNOSIS — Z794 Long term (current) use of insulin: Secondary | ICD-10-CM

## 2017-01-15 DIAGNOSIS — E1165 Type 2 diabetes mellitus with hyperglycemia: Secondary | ICD-10-CM

## 2017-01-15 MED ORDER — SEMAGLUTIDE(0.25 OR 0.5MG/DOS) 2 MG/1.5ML ~~LOC~~ SOPN: 0.5000 mg | PEN_INJECTOR | SUBCUTANEOUS | 2 refills | Status: DC

## 2017-01-15 NOTE — Progress Notes (Signed)
Reason for Appointment: Follow-up for Type 2 Diabetes  Referring physician: Ehinger   History of Present Illness:          Date of diagnosis of type 2 diabetes mellitus:?  1997        Background history:   He was symptomatic at diagnosis with increased urination He has been mostly treated with metformin and glipizide in the past He was taking Januvia more recently but this was changed in 2017 2 Onglyza because of insurance preference He does not think his sugars have been consistently controlled in the past, best A1c recently appears to be in 05/2014 which was 7.5 but subsequently has been at least 8%  Recent history:   LANTUS insulin: 20 units daily  Non-insulin hypoglycemic drugs: Metformin ER 500, 4 tablets in a.m.,   Victoza 1.8 mg daily, Jardiance 25 mg daily   His A1c was  8.5 previously and now 8.7  Current management, blood sugar patterns and problems identified:  He still has not improved his compliance with all measures of self-care for his diabetes  He is checking his blood sugars very erratically and only 3 times in the last month  Most of his blood sugars are high over the last couple of months with only a couple of good readings this month measuring 112 and 116.  However his other blood sugars range from about 140 after 193, sometimes higher in the morning also  He still has not been able to lose any weight with limited exercise and inconsistent diet  He is still not motivated to improve his level of control  Also now says that he will miss doses of his Victoza once or twice a week  Not clear if his POSTPRANDIAL readings are consistently high as he is not checking these except once in the last couple of months        Side effects from medications have been: none  Compliance with the medical regimen: Inadequate Hypoglycemia:   never  Glucose monitoring: Using contour meter with readings as above   Self-care: The diet that the patient has been  following is: tries to limit fast food usually .     Meal times are:  Breakfast is at 9 AM-12 pm Lunch: 2-4 PM Dinner:  6-8 PM              Dietician visit, most recent: 9/17               Exercise:  1-2/7 days, walking 1 hour on the treadmill   Weight history:   He had a gastric band in 2009 but this was loosened a couple of years ago because with taking metformin he would get uncomfortably bloated and has gained weight  Wt Readings from Last 3 Encounters:  01/15/17 289 lb 3.2 oz (131.2 kg)  10/09/16 288 lb 12.8 oz (131 kg)  08/07/16 289 lb 6.4 oz (131.3 kg)    Glycemic control:    Lab Results  Component Value Date   HGBA1C 8.7 (H) 01/03/2017   HGBA1C 8.5 (H) 10/05/2016   HGBA1C 10.6 (H) 06/20/2016   Lab Results  Component Value Date   MICROALBUR <0.7 03/17/2016   LDLCALC 66 03/05/2016   CREATININE 0.93 01/03/2017   Lab Results  Component Value Date   MICRALBCREAT 0.4 03/17/2016    No visits with results within 1 Week(s) from this visit.  Latest known visit with results is:  Appointment on  01/03/2017  Component Date Value Ref Range Status  . Sodium 01/03/2017 140  135 - 145 mEq/L Final  . Potassium 01/03/2017 4.2  3.5 - 5.1 mEq/L Final  . Chloride 01/03/2017 96  96 - 112 mEq/L Final  . CO2 01/03/2017 36* 19 - 32 mEq/L Final  . Glucose, Bld 01/03/2017 134* 70 - 99 mg/dL Final  . BUN 16/10/960412/19/2018 13  6 - 23 mg/dL Final  . Creatinine, Ser 01/03/2017 0.93  0.40 - 1.50 mg/dL Final  . Total Bilirubin 01/03/2017 0.6  0.2 - 1.2 mg/dL Final  . Alkaline Phosphatase 01/03/2017 88  39 - 117 U/L Final  . AST 01/03/2017 15  0 - 37 U/L Final  . ALT 01/03/2017 15  0 - 53 U/L Final  . Total Protein 01/03/2017 7.9  6.0 - 8.3 g/dL Final  . Albumin 54/09/811912/19/2018 4.5  3.5 - 5.2 g/dL Final  . Calcium 14/78/295612/19/2018 9.9  8.4 - 10.5 mg/dL Final  . GFR 21/30/865712/19/2018 110.11  >60.00 mL/min Final  . Hgb A1c MFr Bld 01/03/2017 8.7* 4.6 - 6.5 % Final   Glycemic Control Guidelines for People with  Diabetes:Non Diabetic:  <6%Goal of Therapy: <7%Additional Action Suggested:  >8%      Allergies as of 01/15/2017   No Known Allergies     Medication List        Accurate as of 01/15/17  3:31 PM. Always use your most recent med list.          amLODipine 10 MG tablet Commonly known as:  NORVASC Take 10 mg by mouth daily.   aspirin EC 81 MG tablet Take 81 mg by mouth daily.   atorvastatin 80 MG tablet Commonly known as:  LIPITOR   CALTRATE 600+D PLUS MINERALS 600-800 MG-UNIT Tabs Take by mouth.   Insulin Glargine 100 UNIT/ML Solostar Pen Commonly known as:  LANTUS SOLOSTAR Inject 20 units daily   JARDIANCE 25 MG Tabs tablet Generic drug:  empagliflozin TAKE ONE TABLET BY MOUTH ONE TIME DAILY   metFORMIN 500 MG 24 hr tablet Commonly known as:  GLUCOPHAGE-XR Take 4 tablets (2,000 mg total) by mouth daily with supper.   multivitamin-iron-minerals-folic acid chewable tablet Chew 1 tablet by mouth daily.   sildenafil 100 MG tablet Commonly known as:  VIAGRA Take 100 mg by mouth daily as needed for erectile dysfunction.   valsartan-hydrochlorothiazide 320-25 MG tablet Commonly known as:  DIOVAN-HCT   VICTOZA 18 MG/3ML Sopn Generic drug:  liraglutide Inject 0.3 mLs (1.8 mg total) into the skin daily. Inject once daily at the same time       Allergies: No Known Allergies  Past Medical History:  Diagnosis Date  . Diabetes mellitus   . Hyperlipidemia   . Hypertension     Past Surgical History:  Procedure Laterality Date  . LAPAROSCOPIC GASTRIC BANDING  01/29/2007    No family history on file.  Social History:  reports that  has never smoked. he has never used smokeless tobacco. He reports that he drinks about 3.0 oz of alcohol per week. He reports that he does not use drugs.   Review of Systems   Lipid history: Not on any medications and LDL is below 100, Last triglycerides over 200    Lab Results  Component Value Date   CHOL 152 06/20/2016    HDL 40.50 06/20/2016   LDLCALC 66 03/05/2016   LDLDIRECT 71.0 06/20/2016   TRIG 250.0 (H) 06/20/2016   CHOLHDL 4 06/20/2016  Hypertension: Followed by PCP    BP Readings from Last 3 Encounters:  01/15/17 114/76  10/09/16 124/80  08/07/16 118/88    Most recent eye exam was recently in 12/18  Most recent foot exam: 10/18    LABS:  No visits with results within 1 Week(s) from this visit.  Latest known visit with results is:  Appointment on 01/03/2017  Component Date Value Ref Range Status  . Sodium 01/03/2017 140  135 - 145 mEq/L Final  . Potassium 01/03/2017 4.2  3.5 - 5.1 mEq/L Final  . Chloride 01/03/2017 96  96 - 112 mEq/L Final  . CO2 01/03/2017 36* 19 - 32 mEq/L Final  . Glucose, Bld 01/03/2017 134* 70 - 99 mg/dL Final  . BUN 93/23/557312/19/2018 13  6 - 23 mg/dL Final  . Creatinine, Ser 01/03/2017 0.93  0.40 - 1.50 mg/dL Final  . Total Bilirubin 01/03/2017 0.6  0.2 - 1.2 mg/dL Final  . Alkaline Phosphatase 01/03/2017 88  39 - 117 U/L Final  . AST 01/03/2017 15  0 - 37 U/L Final  . ALT 01/03/2017 15  0 - 53 U/L Final  . Total Protein 01/03/2017 7.9  6.0 - 8.3 g/dL Final  . Albumin 22/02/542712/19/2018 4.5  3.5 - 5.2 g/dL Final  . Calcium 06/23/762812/19/2018 9.9  8.4 - 10.5 mg/dL Final  . GFR 31/51/761612/19/2018 110.11  >60.00 mL/min Final  . Hgb A1c MFr Bld 01/03/2017 8.7* 4.6 - 6.5 % Final   Glycemic Control Guidelines for People with Diabetes:Non Diabetic:  <6%Goal of Therapy: <7%Additional Action Suggested:  >8%     Physical Examination:  BP 114/76   Pulse 74   Ht 6\' 4"  (1.93 m)   Wt 289 lb 3.2 oz (131.2 kg)   SpO2 96%   BMI 35.20 kg/m     ASSESSMENT:  Diabetes type 2, uncontrolled, with obesity  See history of present illness for detailed discussion of current diabetes management, blood sugar patterns and problems identified  His A1c is now consistently over 8% even with his regimen of Jardiance, Victoza and basal insulin with metformin He has not been consistent with  exercise or diet and has not lost any weight Also inconsistent with glucose monitoring Again still not motivated to take care of his diabetes as discussed on each visit  HYPERTENSION.  Blood pressure is now controlled   PLAN:    He was reminded to start exercising more consistently at least every other day if not more.  Needs to watch his diet and control calories, carbohydrates and high fat meals  He will need to check his readings at least once a day at various times  [facility]za to [facility] after his supply runs out  Showed him how the injection pen is different, discussed dosage titration, possible side effects, safety, benefits and need to inject at the same day of the week regularly  He will start with 0.25 for the first 2 weeks and then 0.5 daily  Consider increasing to 1 mg on the following visit  Stay on Jardiance  Follow-up in 3 months for A1c  Patient Instructions  Exercise daily  Ozempic Weekly  MUST DO SUGARS AT BEDTIME   Reather LittlerAjay Latresha Yahr 01/15/2017, 3:31 PM   Note: This office note was prepared with Dragon voice recognition system technology. Any transcriptional errors that result from this process are unintentional.

## 2017-01-15 NOTE — Patient Instructions (Signed)
Exercise daily  Ozempic Weekly  MUST DO SUGARS AT BEDTIME

## 2017-04-11 ENCOUNTER — Other Ambulatory Visit (INDEPENDENT_AMBULATORY_CARE_PROVIDER_SITE_OTHER): Payer: BLUE CROSS/BLUE SHIELD

## 2017-04-11 DIAGNOSIS — Z794 Long term (current) use of insulin: Secondary | ICD-10-CM | POA: Diagnosis not present

## 2017-04-11 DIAGNOSIS — E1165 Type 2 diabetes mellitus with hyperglycemia: Secondary | ICD-10-CM

## 2017-04-11 LAB — COMPREHENSIVE METABOLIC PANEL
ALT: 16 U/L (ref 0–53)
AST: 17 U/L (ref 0–37)
Albumin: 4.2 g/dL (ref 3.5–5.2)
Alkaline Phosphatase: 88 U/L (ref 39–117)
BILIRUBIN TOTAL: 0.6 mg/dL (ref 0.2–1.2)
BUN: 11 mg/dL (ref 6–23)
CALCIUM: 9.8 mg/dL (ref 8.4–10.5)
CO2: 33 mEq/L — ABNORMAL HIGH (ref 19–32)
Chloride: 96 mEq/L (ref 96–112)
Creatinine, Ser: 0.92 mg/dL (ref 0.40–1.50)
GFR: 111.37 mL/min (ref >60.00)
GLUCOSE: 175 mg/dL — AB (ref 70–99)
Potassium: 3.9 mEq/L (ref 3.5–5.1)
Sodium: 139 mEq/L (ref 135–145)
TOTAL PROTEIN: 7.8 g/dL (ref 6.0–8.3)

## 2017-04-11 LAB — LIPID PANEL
Cholesterol: 154 mg/dL (ref 0–200)
HDL: 49.6 mg/dL (ref >39.00)
LDL Cholesterol: 85 mg/dL (ref 0–99)
NonHDL: 104.39
TRIGLYCERIDES: 96 mg/dL (ref 0.0–149.0)
Total CHOL/HDL Ratio: 3
VLDL: 19.2 mg/dL (ref 0.0–40.0)

## 2017-04-11 LAB — HEMOGLOBIN A1C: Hgb A1c MFr Bld: 8.3 % — ABNORMAL HIGH (ref 4.6–6.5)

## 2017-04-11 LAB — MICROALBUMIN / CREATININE URINE RATIO
CREATININE, U: 102.2 mg/dL
MICROALB/CREAT RATIO: 0.7 mg/g (ref 0.0–30.0)
Microalb, Ur: <0.7 mg/dL (ref 0.0–1.9)

## 2017-04-13 ENCOUNTER — Telehealth: Payer: Self-pay | Admitting: Endocrinology

## 2017-04-13 ENCOUNTER — Other Ambulatory Visit: Payer: Self-pay | Admitting: Endocrinology

## 2017-04-13 ENCOUNTER — Other Ambulatory Visit: Payer: Self-pay

## 2017-04-13 MED ORDER — INSULIN GLARGINE 100 UNIT/ML SOLOSTAR PEN: PEN_INJECTOR | SUBCUTANEOUS | 2 refills | Status: DC

## 2017-04-13 NOTE — Telephone Encounter (Signed)
Done

## 2017-04-13 NOTE — Telephone Encounter (Signed)
Insulin Glargine (LANTUS SOLOSTAR) 100 UNIT/ML Solostar Pen  Pharmacy states they sent over a request for these.     [id number] - [place], [place] - [address]

## 2017-04-15 NOTE — Progress Notes (Signed)
Reason for Appointment: Follow-up for Type 2 Diabetes  Referring physician: Ehinger   History of Present Illness:          Date of diagnosis of type 2 diabetes mellitus:?  1997        Background history:   He was symptomatic at diagnosis with increased urination He has been mostly treated with metformin and glipizide in the past He was taking Januvia more recently but this was changed in 2017 2 Onglyza because of insurance preference He does not think his sugars have been consistently controlled in the past, best A1c recently appears to be in 05/2014 which was 7.5 but subsequently has been at least 8%  Recent history:   LANTUS insulin: 20 units daily  Non-insulin hypoglycemic drugs: Metformin ER 500, 4 tablets in a.m., Jardiance 25 mg daily, Ozempic 0.5 mg weekly  His A1c was 8.7 previously and now 8.3   Current management, blood sugar patterns and problems identified:  He was switched from Victoza to Ozempic because of inconsistent compliance with the injection and inadequate blood sugar control  Has no side effects from this and is taking 0.5 mg weekly as directed  Still has not tried to do better with his glucose monitoring and has only a few fasting readings lately  He sometimes takes his Lantus in the morning and sometimes in the evening  Most of his FASTING blood sugars are high with only 1 good reading of 114  Is not able to lose weight  Despite reminders does not try to improve his diet has seen the dietitian in 2017  He is not trying to find a time to exercise even though he has the ability to do this at work also  Has only one reading after evening meal which was not high        Side effects from medications have been: none  Compliance with the medical regimen: Inadequate Hypoglycemia:   never  Glucose monitoring: Using contour meter with readings   Mean values apply above for all meters except median for One Touch  PRE-MEAL Fasting Lunch  Dinner Bedtime Overall  Glucose range: 114-160      Mean/median:      148   POST-MEAL PC Breakfast PC Lunch PC Dinner  Glucose range:    142  Mean/median:        Self-care: The diet that the patient has been following is: tries to limit fast food usually .     Meal times are:  Breakfast is at 9 AM-12 pm Lunch: 2-4 PM Dinner:  6-8 PM              Dietician visit, most recent: 9/17               Exercise:  Minimal recently  Weight history:   He had a gastric band in 2009 but this was loosened a couple of years ago because with taking metformin he would get uncomfortably bloated and has gained weight  Wt Readings from Last 3 Encounters:  04/16/17 292 lb (132.5 kg)  01/15/17 289 lb 3.2 oz (131.2 kg)  10/09/16 288 lb 12.8 oz (131 kg)    Glycemic control:    Lab Results  Component Value Date   HGBA1C 8.3 (H) 04/11/2017   HGBA1C 8.7 (H) 01/03/2017   HGBA1C 8.5 (H) 10/05/2016   Lab Results  Component Value Date   MICROALBUR <0.7 04/11/2017   LDLCALC 85 04/11/2017  CREATININE 0.92 04/11/2017   Lab Results  Component Value Date   MICRALBCREAT 0.7 04/11/2017    Lab on 04/11/2017  Component Date Value Ref Range Status  . Microalb, Ur 04/11/2017 <0.7  0.0 - 1.9 mg/dL Final  . Creatinine,U 40/98/1191 102.2  mg/dL Final  . Microalb Creat Ratio 04/11/2017 0.7  0.0 - 30.0 mg/g Final  . Cholesterol 04/11/2017 154  0 - 200 mg/dL Final   ATP III Classification       Desirable:  < 200 mg/dL               Borderline High:  200 - 239 mg/dL          High:  > = 478 mg/dL  . Triglycerides 04/11/2017 96.0  0.0 - 149.0 mg/dL Final   Normal:  <295 mg/dLBorderline High:  150 - 199 mg/dL  . HDL 04/11/2017 49.60  >39.00 mg/dL Final  . VLDL 62/13/0865 19.2  0.0 - 40.0 mg/dL Final  . LDL Cholesterol 04/11/2017 85  0 - 99 mg/dL Final  . Total CHOL/HDL Ratio 04/11/2017 3   Final                  Men          Women1/2 Average Risk     3.4          3.3Average Risk          5.0          4.42X  Average Risk          9.6          7.13X Average Risk          15.0          11.0                      . NonHDL 04/11/2017 104.39   Final   NOTE:  Non-HDL goal should be 30 mg/dL higher than patient's LDL goal (i.e. LDL goal of < 70 mg/dL, would have non-HDL goal of < 100 mg/dL)  . Sodium 04/11/2017 139  135 - 145 mEq/L Final  . Potassium 04/11/2017 3.9  3.5 - 5.1 mEq/L Final  . Chloride 04/11/2017 96  96 - 112 mEq/L Final  . CO2 04/11/2017 33* 19 - 32 mEq/L Final  . Glucose, Bld 04/11/2017 175* 70 - 99 mg/dL Final  . BUN 78/46/9629 11  6 - 23 mg/dL Final  . Creatinine, Ser 04/11/2017 0.92  0.40 - 1.50 mg/dL Final  . Total Bilirubin 04/11/2017 0.6  0.2 - 1.2 mg/dL Final  . Alkaline Phosphatase 04/11/2017 88  39 - 117 U/L Final  . AST 04/11/2017 17  0 - 37 U/L Final  . ALT 04/11/2017 16  0 - 53 U/L Final  . Total Protein 04/11/2017 7.8  6.0 - 8.3 g/dL Final  . Albumin 52/84/1324 4.2  3.5 - 5.2 g/dL Final  . Calcium 40/10/2723 9.8  8.4 - 10.5 mg/dL Final  . GFR 36/64/4034 111.37  >60.00 mL/min Final  . Hgb A1c MFr Bld 04/11/2017 8.3* 4.6 - 6.5 % Final   Glycemic Control Guidelines for People with Diabetes:Non Diabetic:  <6%Goal of Therapy: <7%Additional Action Suggested:  >8%      Allergies as of 04/16/2017   No Known Allergies     Medication List        Accurate as of 04/16/17  8:57 AM. Always use your most recent med list.  amLODipine 10 MG tablet Commonly known as:  NORVASC Take 10 mg by mouth daily.   aspirin EC 81 MG tablet Take 81 mg by mouth daily.   atorvastatin 80 MG tablet Commonly known as:  LIPITOR   CALTRATE 600+D PLUS MINERALS 600-800 MG-UNIT Tabs Take by mouth.   Insulin Glargine 100 UNIT/ML Solostar Pen Commonly known as:  LANTUS SOLOSTAR INJECT 20 UNITS ONCE A DAY   JARDIANCE 25 MG Tabs tablet Generic drug:  empagliflozin TAKE ONE TABLET BY MOUTH ONE TIME DAILY   metFORMIN 500 MG 24 hr tablet Commonly known as:  GLUCOPHAGE-XR Take 4 tablets  (2,000 mg total) by mouth daily with supper.   multivitamin-iron-minerals-folic acid chewable tablet Chew 1 tablet by mouth daily.   Semaglutide 0.25 or 0.5 MG/DOSE Sopn Commonly known as:  OZEMPIC Inject 0.5 mg into the skin once a week.   sildenafil 100 MG tablet Commonly known as:  VIAGRA Take 100 mg by mouth daily as needed for erectile dysfunction.   valsartan-hydrochlorothiazide 320-25 MG tablet Commonly known as:  DIOVAN-HCT       Allergies: No Known Allergies  Past Medical History:  Diagnosis Date  . Diabetes mellitus   . Hyperlipidemia   . Hypertension     Past Surgical History:  Procedure Laterality Date  . LAPAROSCOPIC GASTRIC BANDING  01/29/2007    History reviewed. No pertinent family history.  Social History:  reports that he has never smoked. He has never used smokeless tobacco. He reports that he drinks about 3.0 oz of alcohol per week. He reports that he does not use drugs.   Review of Systems   Lipid history: Not on any medications and LDL is below 100 and triglycerides are improved at the bottom    Lab Results  Component Value Date   CHOL 154 04/11/2017   HDL 49.60 04/11/2017   LDLCALC 85 04/11/2017   LDLDIRECT 71.0 06/20/2016   TRIG 96.0 04/11/2017   CHOLHDL 3 04/11/2017           Hypertension: Followed by PCP  Blood pressure relatively higher today  BP Readings from Last 3 Encounters:  04/16/17 120/90  01/15/17 114/76  10/09/16 124/80    Most recent eye exam was recently in 12/18  Most recent foot exam: 10/18    LABS:  Lab on 04/11/2017  Component Date Value Ref Range Status  . Microalb, Ur 04/11/2017 <0.7  0.0 - 1.9 mg/dL Final  . Creatinine,U 98/11/914703/27/2019 102.2  mg/dL Final  . Microalb Creat Ratio 04/11/2017 0.7  0.0 - 30.0 mg/g Final  . Cholesterol 04/11/2017 154  0 - 200 mg/dL Final   ATP III Classification       Desirable:  < 200 mg/dL               Borderline High:  200 - 239 mg/dL          High:  > = 829240 mg/dL  .  Triglycerides 04/11/2017 96.0  0.0 - 149.0 mg/dL Final   Normal:  <562<150 mg/dLBorderline High:  150 - 199 mg/dL  . HDL 04/11/2017 49.60  >39.00 mg/dL Final  . VLDL 13/08/657803/27/2019 19.2  0.0 - 40.0 mg/dL Final  . LDL Cholesterol 04/11/2017 85  0 - 99 mg/dL Final  . Total CHOL/HDL Ratio 04/11/2017 3   Final                  Men          Women1/2 Average Risk  3.4          3.3Average Risk          5.0          4.42X Average Risk          9.6          7.13X Average Risk          15.0          11.0                      . NonHDL 04/11/2017 104.39   Final   NOTE:  Non-HDL goal should be 30 mg/dL higher than patient's LDL goal (i.e. LDL goal of < 70 mg/dL, would have non-HDL goal of < 100 mg/dL)  . Sodium 04/11/2017 139  135 - 145 mEq/L Final  . Potassium 04/11/2017 3.9  3.5 - 5.1 mEq/L Final  . Chloride 04/11/2017 96  96 - 112 mEq/L Final  . CO2 04/11/2017 33* 19 - 32 mEq/L Final  . Glucose, Bld 04/11/2017 175* 70 - 99 mg/dL Final  . BUN 16/10/9602 11  6 - 23 mg/dL Final  . Creatinine, Ser 04/11/2017 0.92  0.40 - 1.50 mg/dL Final  . Total Bilirubin 04/11/2017 0.6  0.2 - 1.2 mg/dL Final  . Alkaline Phosphatase 04/11/2017 88  39 - 117 U/L Final  . AST 04/11/2017 17  0 - 37 U/L Final  . ALT 04/11/2017 16  0 - 53 U/L Final  . Total Protein 04/11/2017 7.8  6.0 - 8.3 g/dL Final  . Albumin 54/09/8117 4.2  3.5 - 5.2 g/dL Final  . Calcium 14/78/2956 9.8  8.4 - 10.5 mg/dL Final  . GFR 21/30/8657 111.37  >60.00 mL/min Final  . Hgb A1c MFr Bld 04/11/2017 8.3* 4.6 - 6.5 % Final   Glycemic Control Guidelines for People with Diabetes:Non Diabetic:  <6%Goal of Therapy: <7%Additional Action Suggested:  >8%     Physical Examination:  BP 120/90 (BP Location: Left Arm, Patient Position: Sitting, Cuff Size: Large)   Pulse (!) 104   Ht 6\' 4"  (1.93 m)   Wt 292 lb (132.5 kg)   SpO2 97%   BMI 35.54 kg/m     ASSESSMENT:  Diabetes type 2, uncontrolled, with obesity  See history of present illness for detailed  discussion of current diabetes management, blood sugar patterns and problems identified  His A1c is consistently over 8%  He has mostly high fasting readings although did have a good reading of 114 once This makes it likely that he is having high readings after meals he does not monitor Also has not lost any weight related to his inconsistent compliance with diet and not starting exercise regimen as recommended  With the once a week injection of Ozempic he is seeing some improvement in his blood sugars compared to Victoza However he is probably inconsistent with his Lantus insulin as he sometimes switches between morning and evening  Hypertension and polyuria: Recommended that he follow-up with his PCP  PLAN:    He will start taking more readings after meals  Ozempic 1 mg daily instead of 0.5  Take Lantus 22 units in the morning consistently  Start regular exercise at least 4 days a week  Although he has been recommended consultation with dietitian he does not want to do this  Stay on Jardiance  Follow-up in 3 months for A1c  There are no Patient Instructions on file for this visit.  Reather Littler  04/16/2017, 8:57 AM   Note: This office note was prepared with Dragon voice recognition system technology. Any transcriptional errors that result from this process are unintentional.

## 2017-04-16 ENCOUNTER — Encounter: Payer: Self-pay | Admitting: Endocrinology

## 2017-04-16 ENCOUNTER — Ambulatory Visit: Payer: BLUE CROSS/BLUE SHIELD | Admitting: Endocrinology

## 2017-04-16 VITALS — BP 120/90 | HR 104 | Ht 76.0 in | Wt 292.0 lb

## 2017-04-16 DIAGNOSIS — E1165 Type 2 diabetes mellitus with hyperglycemia: Secondary | ICD-10-CM | POA: Diagnosis not present

## 2017-04-16 DIAGNOSIS — Z794 Long term (current) use of insulin: Secondary | ICD-10-CM

## 2017-04-16 MED ORDER — SEMAGLUTIDE (1 MG/DOSE) 2 MG/1.5ML ~~LOC~~ SOPN: 1.0000 mg | PEN_INJECTOR | SUBCUTANEOUS | 3 refills | Status: DC

## 2017-04-16 NOTE — Patient Instructions (Addendum)
lantus in am only and 22 units daily  Walk at work  MORE SUGARS at bedtime

## 2017-05-09 ENCOUNTER — Telehealth: Payer: Self-pay | Admitting: Endocrinology

## 2017-05-09 NOTE — Telephone Encounter (Signed)
Please advise 

## 2017-05-09 NOTE — Telephone Encounter (Signed)
His blood sugars will be better controlled with this type of medication and there is no substitute. He can try taking half a pill a day and if this is agreeable with him he can switch to the 10 mg prescription  next time

## 2017-05-09 NOTE — Telephone Encounter (Signed)
Pt was called and notified of MD message. Pt verbalized understanding and stated that he will begin taking 1/2 tablet and see if this is agreeable with him.

## 2017-05-09 NOTE — Telephone Encounter (Signed)
JARDIANCE 25 MG TABS tablet  Patient stated he has been taking this medication and having symptoms that he believed what from they. He stated he was urinating to much on this  He stated that Dr Gaylan GeroldInger had the him stop the medication for 10 days to see if this was the problem and patient stated that symptoms went away when he done this.  They advised him to call and let our dr know and see if there is anything else he can take.    please advise

## 2017-05-21 ENCOUNTER — Other Ambulatory Visit: Payer: Self-pay | Admitting: Endocrinology

## 2017-05-21 MED ORDER — EMPAGLIFLOZIN 10 MG PO TABS: 10.0000 mg | ORAL_TABLET | Freq: Every day | ORAL | 4 refills | Status: DC

## 2017-07-12 ENCOUNTER — Other Ambulatory Visit (INDEPENDENT_AMBULATORY_CARE_PROVIDER_SITE_OTHER): Payer: BLUE CROSS/BLUE SHIELD

## 2017-07-12 DIAGNOSIS — Z794 Long term (current) use of insulin: Secondary | ICD-10-CM

## 2017-07-12 DIAGNOSIS — E1165 Type 2 diabetes mellitus with hyperglycemia: Secondary | ICD-10-CM | POA: Diagnosis not present

## 2017-07-12 LAB — BASIC METABOLIC PANEL
BUN: 11 mg/dL (ref 6–23)
CALCIUM: 9.7 mg/dL (ref 8.4–10.5)
CO2: 34 mEq/L — ABNORMAL HIGH (ref 19–32)
Chloride: 98 mEq/L (ref 96–112)
Creatinine, Ser: 0.94 mg/dL (ref 0.40–1.50)
GFR: 108.53 mL/min (ref >60.00)
Glucose, Bld: 148 mg/dL — ABNORMAL HIGH (ref 70–99)
POTASSIUM: 3.8 meq/L (ref 3.5–5.1)
SODIUM: 139 meq/L (ref 135–145)

## 2017-07-12 LAB — HEMOGLOBIN A1C: HEMOGLOBIN A1C: 7.8 % — AB (ref 4.6–6.5)

## 2017-07-15 NOTE — Progress Notes (Signed)
Reason for Appointment: Follow-up for Type 2 Diabetes  Referring physician: Ehinger   History of Present Illness:          Date of diagnosis of type 2 diabetes mellitus:?  1997        Background history:   He was symptomatic at diagnosis with increased urination He has been mostly treated with metformin and glipizide in the past He was taking Januvia more recently but this was changed in 2017 2 Onglyza because of insurance preference He does not think his sugars have been consistently controlled in the past, best A1c recently appears to be in 05/2014 which was 7.5 but subsequently has been at least 8%  Recent history:   LANTUS insulin: 22 units daily  Non-insulin hypoglycemic drugs: Metformin ER 500, 4 tablets in a.m., Jardiance 10 mg daily, Ozempic 1 mg weekly  His A1c was as high as 8.7 previously and now 7.8  Current management, blood sugar patterns and problems identified:  He was told to increase his Ozempic to 1 mg and he takes that regularly on Sunday  However even though his A1c is better he is still checking blood sugars very sporadically and mostly in the morning  He does have overall relatively high readings fasting but not consistent  Yesterday because he forgot to take all his medications his blood sugar was the highest level of 172 this morning  Despite reminders he does not exercise at all even though he is able to find the time  He was told to take Lantus consistently in the morning instead of at variable times Previous  Not clear if his postprandial readings are high as he does not check these and his mealtimes are irregular.  He was on 25 mg of Jardiance and apparently he went to his PCP because of balanitis and the dose was reduced to 10 mg which he is tolerating better  His weight has come down slightly        Side effects from medications have been: none  Compliance with the medical regimen: Inadequate Hypoglycemia:   never  Glucose  monitoring: Using contour meter with readings   Mean values apply above for all meters except median for One Touch  PRE-MEAL Fasting Lunch Dinner Bedtime Overall  Glucose range:  109-172  154   116   Mean/median:  135     14 8   POST-MEAL PC Breakfast PC Lunch PC Dinner  Glucose range:    142  Mean/median:        Self-care: The diet that the patient has been following is: tries to limit fast food usually .     Meal times are:  Breakfast is at 9 AM-12 pm Lunch: 2-4 PM Dinner:  6-8 PM              Dietician visit, most recent: 9/17               Exercise:  Minimal recently  Weight history:   He had a gastric band in 2009 but this was loosened a couple of years ago because with taking metformin he would get uncomfortably bloated and has gained weight  Wt Readings from Last 3 Encounters:  07/16/17 288 lb 9.6 oz (130.9 kg)  04/16/17 292 lb (132.5 kg)  01/15/17 289 lb 3.2 oz (131.2 kg)    Glycemic control:    Lab Results  Component Value Date   HGBA1C 7.8 (H) 07/12/2017  HGBA1C 8.3 (H) 04/11/2017   HGBA1C 8.7 (H) 01/03/2017   Lab Results  Component Value Date   MICROALBUR <0.7 04/11/2017   LDLCALC 85 04/11/2017   CREATININE 0.94 07/12/2017   Lab Results  Component Value Date   MICRALBCREAT 0.7 04/11/2017    Lab on 07/12/2017  Component Date Value Ref Range Status  . Sodium 07/12/2017 139  135 - 145 mEq/L Final  . Potassium 07/12/2017 3.8  3.5 - 5.1 mEq/L Final  . Chloride 07/12/2017 98  96 - 112 mEq/L Final  . CO2 07/12/2017 34* 19 - 32 mEq/L Final  . Glucose, Bld 07/12/2017 148* 70 - 99 mg/dL Final  . BUN 16/10/9602 11  6 - 23 mg/dL Final  . Creatinine, Ser 07/12/2017 0.94  0.40 - 1.50 mg/dL Final  . Calcium 54/09/8117 9.7  8.4 - 10.5 mg/dL Final  . GFR 14/78/2956 108.53  >60.00 mL/min Final  . Hgb A1c MFr Bld 07/12/2017 7.8* 4.6 - 6.5 % Final   Glycemic Control Guidelines for People with Diabetes:Non Diabetic:  <6%Goal of Therapy: <7%Additional Action  Suggested:  >8%      Allergies as of 07/16/2017   No Known Allergies     Medication List        Accurate as of 07/16/17  9:19 AM. Always use your most recent med list.          amLODipine 10 MG tablet Commonly known as:  NORVASC Take 10 mg by mouth daily.   atorvastatin 80 MG tablet Commonly known as:  LIPITOR   CALTRATE 600+D PLUS MINERALS 600-800 MG-UNIT Tabs Take by mouth.   empagliflozin 10 MG Tabs tablet Commonly known as:  JARDIANCE Take 10 mg by mouth daily.   Insulin Glargine 100 UNIT/ML Solostar Pen Commonly known as:  LANTUS SOLOSTAR INJECT 20 UNITS ONCE A DAY   metFORMIN 500 MG 24 hr tablet Commonly known as:  GLUCOPHAGE-XR Take 4 tablets (2,000 mg total) by mouth daily with supper.   multivitamin-iron-minerals-folic acid chewable tablet Chew 1 tablet by mouth daily.   Semaglutide 1 MG/DOSE Sopn Commonly known as:  OZEMPIC Inject 1 mg into the skin once a week.   valsartan-hydrochlorothiazide 320-25 MG tablet Commonly known as:  DIOVAN-HCT       Allergies: No Known Allergies  Past Medical History:  Diagnosis Date  . Diabetes mellitus   . Hyperlipidemia   . Hypertension     Past Surgical History:  Procedure Laterality Date  . LAPAROSCOPIC GASTRIC BANDING  01/29/2007    History reviewed. No pertinent family history.  Social History:  reports that he has never smoked. He has never used smokeless tobacco. He reports that he drinks about 3.0 oz of alcohol per week. He reports that he does not use drugs.   Review of Systems   Lipid history: Not on any medications and LDL is below 100 and triglycerides are improved at the bottom    Lab Results  Component Value Date   CHOL 154 04/11/2017   HDL 49.60 04/11/2017   LDLCALC 85 04/11/2017   LDLDIRECT 71.0 06/20/2016   TRIG 96.0 04/11/2017   CHOLHDL 3 04/11/2017           Hypertension: Followed by PCP  Blood pressure stays persistently high  BP Readings from Last 3 Encounters:    07/16/17 138/90  04/16/17 120/90  01/15/17 114/76    Most recent eye exam was recently in 12/18  Most recent foot exam: 10/18    LABS:  Lab on  07/12/2017  Component Date Value Ref Range Status  . Sodium 07/12/2017 139  135 - 145 mEq/L Final  . Potassium 07/12/2017 3.8  3.5 - 5.1 mEq/L Final  . Chloride 07/12/2017 98  96 - 112 mEq/L Final  . CO2 07/12/2017 34* 19 - 32 mEq/L Final  . Glucose, Bld 07/12/2017 148* 70 - 99 mg/dL Final  . BUN 16/10/9602 11  6 - 23 mg/dL Final  . Creatinine, Ser 07/12/2017 0.94  0.40 - 1.50 mg/dL Final  . Calcium 54/09/8117 9.7  8.4 - 10.5 mg/dL Final  . GFR 14/78/2956 108.53  >60.00 mL/min Final  . Hgb A1c MFr Bld 07/12/2017 7.8* 4.6 - 6.5 % Final   Glycemic Control Guidelines for People with Diabetes:Non Diabetic:  <6%Goal of Therapy: <7%Additional Action Suggested:  >8%     Physical Examination:  BP 138/90 (BP Location: Left Arm, Patient Position: Sitting, Cuff Size: Normal)   Pulse 85   Ht 6\' 4"  (1.93 m)   Wt 288 lb 9.6 oz (130.9 kg)   SpO2 96%   BMI 35.13 kg/m     ASSESSMENT:  Diabetes type 2, uncontrolled, with obesity  See history of present illness for detailed discussion of current diabetes management, blood sugar patterns and problems identified  His A1c is gradually improving and now 7.8  His main difficulty is not checking his sugars after meals as much and irregular eating habits as well as lack of exercise. He can do much better with exercise regimen especially since he is not working currently Still getting some efficacy from [facility] even though the dose was reduced to 10 mg when he was having more polyuria and some balanitis with this  HYPERTENSION: He needs to follow-up with PCP, may need additional medication   PLAN:    He will need to start exercising at least 3 to 4 days a week  To check blood sugars after meals more consistently  No change in Ozempic or insulin as yet  Discussed blood sugar  targets  Stay on Jardiance 10  Follow-up in 3 months for A1c  Patient Instructions  Exercise daily  More sugars after meals  Check blood sugars on waking up 3/7   Also check blood sugars about 2 hours after a meal and do this after different meals by rotation  Recommended blood sugar levels on waking up is 90-130 and about 2 hours after meal is 130-160  Please bring your blood sugar monitor to each visit, thank you      Reather Littler 07/16/2017, 9:19 AM   Note: This office note was prepared with Dragon voice recognition system technology. Any transcriptional errors that result from this process are unintentional.

## 2017-07-16 ENCOUNTER — Ambulatory Visit: Payer: BLUE CROSS/BLUE SHIELD | Admitting: Endocrinology

## 2017-07-16 ENCOUNTER — Encounter: Payer: Self-pay | Admitting: Endocrinology

## 2017-07-16 VITALS — BP 138/90 | HR 85 | Ht 76.0 in | Wt 288.6 lb

## 2017-07-16 DIAGNOSIS — Z794 Long term (current) use of insulin: Secondary | ICD-10-CM | POA: Diagnosis not present

## 2017-07-16 DIAGNOSIS — E1165 Type 2 diabetes mellitus with hyperglycemia: Secondary | ICD-10-CM

## 2017-07-16 NOTE — Patient Instructions (Addendum)
Exercise daily  More sugars after meals  Check blood sugars on waking up 3/7   Also check blood sugars about 2 hours after a meal and do this after different meals by rotation  Recommended blood sugar levels on waking up is 90-130 and about 2 hours after meal is 130-160  Please bring your blood sugar monitor to each visit, thank you

## 2017-08-11 ENCOUNTER — Other Ambulatory Visit: Payer: Self-pay | Admitting: Endocrinology

## 2017-10-15 ENCOUNTER — Other Ambulatory Visit (INDEPENDENT_AMBULATORY_CARE_PROVIDER_SITE_OTHER): Payer: BLUE CROSS/BLUE SHIELD

## 2017-10-15 DIAGNOSIS — Z794 Long term (current) use of insulin: Secondary | ICD-10-CM | POA: Diagnosis not present

## 2017-10-15 DIAGNOSIS — E1165 Type 2 diabetes mellitus with hyperglycemia: Secondary | ICD-10-CM | POA: Diagnosis not present

## 2017-10-15 LAB — BASIC METABOLIC PANEL
BUN: 13 mg/dL (ref 6–23)
CALCIUM: 9.4 mg/dL (ref 8.4–10.5)
CO2: 32 meq/L (ref 19–32)
Chloride: 99 mEq/L (ref 96–112)
Creatinine, Ser: 0.88 mg/dL (ref 0.40–1.50)
GFR: 117 mL/min (ref >60.00)
GLUCOSE: 156 mg/dL — AB (ref 70–99)
Potassium: 3.6 mEq/L (ref 3.5–5.1)
SODIUM: 140 meq/L (ref 135–145)

## 2017-10-15 LAB — HEMOGLOBIN A1C: Hgb A1c MFr Bld: 7.9 % — ABNORMAL HIGH (ref 4.6–6.5)

## 2017-10-18 ENCOUNTER — Encounter: Payer: Self-pay | Admitting: Endocrinology

## 2017-10-18 ENCOUNTER — Ambulatory Visit (INDEPENDENT_AMBULATORY_CARE_PROVIDER_SITE_OTHER): Payer: BLUE CROSS/BLUE SHIELD | Admitting: Endocrinology

## 2017-10-18 VITALS — BP 134/88 | HR 76 | Ht 76.0 in | Wt 286.0 lb

## 2017-10-18 DIAGNOSIS — E1165 Type 2 diabetes mellitus with hyperglycemia: Secondary | ICD-10-CM | POA: Diagnosis not present

## 2017-10-18 DIAGNOSIS — I10 Essential (primary) hypertension: Secondary | ICD-10-CM

## 2017-10-18 NOTE — Progress Notes (Signed)
Reason for Appointment: Follow-up for Type 2 Diabetes  Referring physician: Ehinger   History of Present Illness:          Date of diagnosis of type 2 diabetes mellitus:?  1997        Background history:   He was symptomatic at diagnosis with increased urination He has been mostly treated with metformin and glipizide in the past He was taking Januvia more recently but this was changed in 2017 2 Onglyza because of insurance preference He does not think his sugars have been consistently controlled in the past, best A1c recently appears to be in 05/2014 which was 7.5 but subsequently has been at least 8%  Recent history:   LANTUS insulin: 22 units daily in am  Non-insulin hypoglycemic drugs: Metformin ER 500, 4 tablets in a.m., Jardiance 10 mg daily, Ozempic 1 mg weekly  His A1c was as high as 8.7 previously and now about the same at 7.9   Current management, blood sugar patterns and problems identified:  He is still checking blood sugars mostly in the mornings and not after meals as directed  He has been encouraged to start exercising on several visits and he still has not done any  He thinks he is generally watching his diet and his weight is down another 2 pounds also,: He again thinks that his diet can be more completely regulated with types of foods and portions, may still get occasional fast food  However he is taking his Lantus erratically and if he forgets in the morning he will take it at lunch and rarely will forget it completely  Blood sugar is fluctuating in the morning and can be normal and no hypoglycemia at this time  Has taken his Jardiance and Ozempic regularly, taking his own medications at lunchtime usually        Side effects from medications have been: none  Compliance with the medical regimen: Inadequate Hypoglycemia:   never  Glucose monitoring: Using contour meter with readings    PRE-MEAL Fasting Lunch Dinner Bedtime Overall  Glucose  range:  97-185    101   Mean/median:  131       Previous average 148  Self-care: The diet that the patient has been following is: tries to limit fast food usually .     Meal times are:  Breakfast is at 9 AM-12 pm Lunch: 2-4 PM Dinner:  6-8 PM              Dietician visit, most recent: 9/17               Exercise:  Minimal recently  Weight history:   He had a gastric band in 2009 but this was loosened a couple of years ago because with taking metformin he would get uncomfortably bloated and has gained weight  Wt Readings from Last 3 Encounters:  10/18/17 286 lb (129.7 kg)  07/16/17 288 lb 9.6 oz (130.9 kg)  04/16/17 292 lb (132.5 kg)    Glycemic control:    Lab Results  Component Value Date   HGBA1C 7.9 (H) 10/15/2017   HGBA1C 7.8 (H) 07/12/2017   HGBA1C 8.3 (H) 04/11/2017   Lab Results  Component Value Date   MICROALBUR <0.7 04/11/2017   LDLCALC 85 04/11/2017   CREATININE 0.88 10/15/2017   Lab Results  Component Value Date   MICRALBCREAT 0.7 04/11/2017    Lab on 10/15/2017  Component Date Value Ref  Range Status  . Sodium 10/15/2017 140  135 - 145 mEq/L Final  . Potassium 10/15/2017 3.6  3.5 - 5.1 mEq/L Final  . Chloride 10/15/2017 99  96 - 112 mEq/L Final  . CO2 10/15/2017 32  19 - 32 mEq/L Final  . Glucose, Bld 10/15/2017 156* 70 - 99 mg/dL Final  . BUN 16/10/9602 13  6 - 23 mg/dL Final  . Creatinine, Ser 10/15/2017 0.88  0.40 - 1.50 mg/dL Final  . Calcium 54/09/8117 9.4  8.4 - 10.5 mg/dL Final  . GFR 14/78/2956 117.00  >60.00 mL/min Final  . Hgb A1c MFr Bld 10/15/2017 7.9* 4.6 - 6.5 % Final   Glycemic Control Guidelines for People with Diabetes:Non Diabetic:  <6%Goal of Therapy: <7%Additional Action Suggested:  >8%      Allergies as of 10/18/2017   No Known Allergies     Medication List        Accurate as of 10/18/17  8:49 AM. Always use your most recent med list.          amLODipine 10 MG tablet Commonly known as:  NORVASC Take 10 mg by mouth  daily.   atorvastatin 80 MG tablet Commonly known as:  LIPITOR   CALTRATE 600+D PLUS MINERALS 600-800 MG-UNIT Tabs Take by mouth.   empagliflozin 10 MG Tabs tablet Commonly known as:  JARDIANCE Take 10 mg by mouth daily.   Insulin Glargine 100 UNIT/ML Solostar Pen Commonly known as:  LANTUS INJECT 20 UNITS ONCE A DAY   metFORMIN 500 MG 24 hr tablet Commonly known as:  GLUCOPHAGE-XR Take 4 tablets (2,000 mg total) by mouth daily with supper.   multivitamin-iron-minerals-folic acid chewable tablet Chew 1 tablet by mouth daily.   OZEMPIC (1 MG/DOSE) 2 MG/1.5ML Sopn Generic drug:  Semaglutide (1 MG/DOSE) INJECT 1 MG INTO THE SKIN ONCE A WEEK   valsartan-hydrochlorothiazide 320-25 MG tablet Commonly known as:  DIOVAN-HCT       Allergies: No Known Allergies  Past Medical History:  Diagnosis Date  . Diabetes mellitus   . Hyperlipidemia   . Hypertension     Past Surgical History:  Procedure Laterality Date  . LAPAROSCOPIC GASTRIC BANDING  01/29/2007    No family history on file.  Social History:  reports that he has never smoked. He has never used smokeless tobacco. He reports that he drinks about 5.0 standard drinks of alcohol per week. He reports that he does not use drugs.   Review of Systems   Lipid history: Not on any medications and LDL is below 100 and triglycerides are improved at the bottom    Lab Results  Component Value Date   CHOL 154 04/11/2017   HDL 49.60 04/11/2017   LDLCALC 85 04/11/2017   LDLDIRECT 71.0 06/20/2016   TRIG 96.0 04/11/2017   CHOLHDL 3 04/11/2017           Hypertension: Followed by PCP  Blood pressure is again high and he has not taken his medication yesterday Does not monitor at home  BP Readings from Last 3 Encounters:  10/18/17 134/88  07/16/17 138/90  04/16/17 120/90    Most recent eye exam was recently in 12/18  Most recent foot exam: 10/18  LABS:  Lab on 10/15/2017  Component Date Value Ref Range Status    . Sodium 10/15/2017 140  135 - 145 mEq/L Final  . Potassium 10/15/2017 3.6  3.5 - 5.1 mEq/L Final  . Chloride 10/15/2017 99  96 - 112 mEq/L Final  .  CO2 10/15/2017 32  19 - 32 mEq/L Final  . Glucose, Bld 10/15/2017 156* 70 - 99 mg/dL Final  . BUN 16/10/9602 13  6 - 23 mg/dL Final  . Creatinine, Ser 10/15/2017 0.88  0.40 - 1.50 mg/dL Final  . Calcium 54/09/8117 9.4  8.4 - 10.5 mg/dL Final  . GFR 14/78/2956 117.00  >60.00 mL/min Final  . Hgb A1c MFr Bld 10/15/2017 7.9* 4.6 - 6.5 % Final   Glycemic Control Guidelines for People with Diabetes:Non Diabetic:  <6%Goal of Therapy: <7%Additional Action Suggested:  >8%     Physical Examination:  BP 134/88   Pulse 76   Ht 6\' 4"  (1.93 m)   Wt 286 lb (129.7 kg)   SpO2 98%   BMI 34.81 kg/m     ASSESSMENT:  Diabetes type 2, uncontrolled, with obesity  See history of present illness for detailed discussion of current diabetes management, blood sugar patterns and problems identified  His A1c is still relatively high at 7.9  The compliance with his insulin may be inconsistent Also he thinks he can do better with his diet Still not motivated to start his exercise regimen as discussed on each visit Currently not monitoring blood sugars after meals which may be higher at times Also fasting blood sugars are not consistently controlled   HYPERTENSION: Followed by PCP, discussed benefits of exercise Also to be consistent with low sodium intake especially avoiding fast food   PLAN:    He will need to take his Lantus at lunchtime consistently when he can do it more likely  More readings after meals and rotate the times of testing  Discussed blood sugar targets after meals  He will increase Jardiance to 2 tablets of the 10 and call if this is tolerable and will change him to either 20 mg or 25  Follow-up in 3 months for A1c  Recommended influenza vaccine but he refuses  There are no Patient Instructions on file for this visit.  Reather Littler 10/18/2017, 8:49 AM   Note: This office note was prepared with Dragon voice recognition system technology. Any transcriptional errors that result from this process are unintentional.

## 2017-10-18 NOTE — Patient Instructions (Addendum)
Check blood sugars on waking up days a week  Also check blood sugars about 2 hours after meals and do this after different meals by rotation  Recommended blood sugar levels on waking up are 90-130 and about 2 hours after meal is 130-160  Please bring your blood sugar monitor to each visit, thank you  Take 2 Jardiance daily  Exercise daily  Lantus at lunch

## 2017-11-03 ENCOUNTER — Other Ambulatory Visit: Payer: Self-pay | Admitting: Endocrinology

## 2017-11-05 ENCOUNTER — Telehealth: Payer: Self-pay | Admitting: Endocrinology

## 2017-11-07 NOTE — Telephone Encounter (Signed)
error 

## 2017-11-13 ENCOUNTER — Other Ambulatory Visit: Payer: Self-pay | Admitting: Endocrinology

## 2017-11-13 ENCOUNTER — Telehealth: Payer: Self-pay | Admitting: Endocrinology

## 2017-11-13 MED ORDER — GLUCOSE BLOOD VI STRP: ORAL_STRIP | 3 refills | Status: DC

## 2017-11-13 NOTE — Telephone Encounter (Signed)
Pt stated someone ordered the wrong different brand strips and could not take it back. Pt requesting for contour strips and have the  contour next device.Marland KitchenMarland KitchenMarland KitchenRx sent to [facility] pharmacy

## 2017-11-13 NOTE — Telephone Encounter (Signed)
Patient requests a Rx for Contour Next Test Strips sent to [facility] on [address]

## 2017-11-20 ENCOUNTER — Telehealth: Payer: Self-pay | Admitting: Endocrinology

## 2017-11-20 NOTE — Telephone Encounter (Signed)
error 

## 2017-12-25 ENCOUNTER — Other Ambulatory Visit: Payer: Self-pay | Admitting: Endocrinology

## 2018-01-15 ENCOUNTER — Other Ambulatory Visit (INDEPENDENT_AMBULATORY_CARE_PROVIDER_SITE_OTHER): Payer: BLUE CROSS/BLUE SHIELD

## 2018-01-15 DIAGNOSIS — E1165 Type 2 diabetes mellitus with hyperglycemia: Secondary | ICD-10-CM

## 2018-01-15 LAB — LIPID PANEL
CHOLESTEROL: 149 mg/dL (ref 0–200)
HDL: 37 mg/dL — ABNORMAL LOW (ref >39.00)
LDL Cholesterol: 85 mg/dL (ref 0–99)
NonHDL: 112.38
Total CHOL/HDL Ratio: 4
Triglycerides: 136 mg/dL (ref 0.0–149.0)
VLDL: 27.2 mg/dL (ref 0.0–40.0)

## 2018-01-15 LAB — COMPREHENSIVE METABOLIC PANEL
ALT: 15 U/L (ref 0–53)
AST: 18 U/L (ref 0–37)
Albumin: 4 g/dL (ref 3.5–5.2)
Alkaline Phosphatase: 83 U/L (ref 39–117)
BUN: 13 mg/dL (ref 6–23)
CO2: 30 mEq/L (ref 19–32)
CREATININE: 0.97 mg/dL (ref 0.40–1.50)
Calcium: 9.6 mg/dL (ref 8.4–10.5)
Chloride: 99 mEq/L (ref 96–112)
GFR: 104.46 mL/min (ref >60.00)
Glucose, Bld: 129 mg/dL — ABNORMAL HIGH (ref 70–99)
Potassium: 3.6 mEq/L (ref 3.5–5.1)
Sodium: 138 mEq/L (ref 135–145)
Total Bilirubin: 0.5 mg/dL (ref 0.2–1.2)
Total Protein: 7.5 g/dL (ref 6.0–8.3)

## 2018-01-15 LAB — MICROALBUMIN / CREATININE URINE RATIO
Creatinine,U: 187.7 mg/dL
Microalb Creat Ratio: 0.4 mg/g (ref 0.0–30.0)
Microalb, Ur: <0.7 mg/dL (ref 0.0–1.9)

## 2018-01-15 LAB — HEMOGLOBIN A1C: Hgb A1c MFr Bld: 7.7 % — ABNORMAL HIGH (ref 4.6–6.5)

## 2018-01-17 NOTE — Progress Notes (Signed)
Reason for Appointment: Follow-up for Type 2 Diabetes  Referring physician: Ehinger   History of Present Illness:          Date of diagnosis of type 2 diabetes mellitus:?  1997        Background history:   He was symptomatic at diagnosis with increased urination He has been mostly treated with metformin and glipizide in the past He was taking Januvia more recently but this was changed in 2017 2 Onglyza because of insurance preference He does not think his sugars have been consistently controlled in the past, best A1c recently appears to be in 05/2014 which was 7.5 but subsequently has been at least 8%  Recent history:   LANTUS insulin: 22 units daily at variable times  Non-insulin hypoglycemic drugs: Metformin ER 500, 4 tablets in a.m., Jardiance 10 mg daily, Ozempic 1 mg weekly  His A1c was as high as 8.7 previously and now about the same at 7.7   Current management, blood sugar patterns and problems identified:  He is still taking his Lantus at variable times although more in the evening now  With this he may be having some variability in blood sugar with at least one reading around 170  Also forgetting to check readings after meals  Has only one reading at bedtime of 173 which he thinks may be related to eating some sweets  Again despite reminding him regularly he does not do any formal exercise  His weight is slightly better however  He was told to try doubling up on his Jardiance to see if he can tolerate it but he forgets to do this, previously had some balanitis with higher dose of Jardiance  No side effects from Ozempic        Side effects from medications have been: none  Compliance with the medical regimen: Inadequate Hypoglycemia:   never  Glucose monitoring: Using contour meter, download results:  FASTING range 111-176 with average 142 and 5 readings Bedtime 173  Previous fasting average 131   Self-care: The diet that the patient has  been following is: tries to limit fast food usually .     Meal times are:  Breakfast is at 9 AM-12 pm Lunch: 2-4 PM Dinner:  6-8 PM              Dietician visit, most recent: 9/17               Exercise:  Minimal recently  Weight history:   He had a gastric band in 2009 but this was loosened a couple of years ago because with taking metformin he would get uncomfortably bloated and has gained weight  Wt Readings from Last 3 Encounters:  01/18/18 283 lb 6.4 oz (128.5 kg)  10/18/17 286 lb (129.7 kg)  07/16/17 288 lb 9.6 oz (130.9 kg)    Glycemic control:    Lab Results  Component Value Date   HGBA1C 7.7 (H) 01/15/2018   HGBA1C 7.9 (H) 10/15/2017   HGBA1C 7.8 (H) 07/12/2017   Lab Results  Component Value Date   MICROALBUR <0.7 01/15/2018   LDLCALC 85 01/15/2018   CREATININE 0.97 01/15/2018   Lab Results  Component Value Date   MICRALBCREAT 0.4 01/15/2018    Lab on 01/15/2018  Component Date Value Ref Range Status  . Cholesterol 01/15/2018 149  0 - 200 mg/dL Final   ATP III Classification       Desirable:  <  200 mg/dL               Borderline High:  200 - 239 mg/dL          High:  > = 295240 mg/dL  . Triglycerides 01/15/2018 136.0  0.0 - 149.0 mg/dL Final   Normal:  <621<150 mg/dLBorderline High:  150 - 199 mg/dL  . HDL 01/15/2018 37.00* >39.00 mg/dL Final  . VLDL 30/86/578412/31/2019 27.2  0.0 - 40.0 mg/dL Final  . LDL Cholesterol 01/15/2018 85  0 - 99 mg/dL Final  . Total CHOL/HDL Ratio 01/15/2018 4   Final                  Men          Women1/2 Average Risk     3.4          3.3Average Risk          5.0          4.42X Average Risk          9.6          7.13X Average Risk          15.0          11.0                      . NonHDL 01/15/2018 112.38   Final   NOTE:  Non-HDL goal should be 30 mg/dL higher than patient's LDL goal (i.e. LDL goal of < 70 mg/dL, would have non-HDL goal of < 100 mg/dL)  . Microalb, Ur 01/15/2018 <0.7  0.0 - 1.9 mg/dL Final  . Creatinine,U 69/62/952812/31/2019 187.7  mg/dL  Final  . Microalb Creat Ratio 01/15/2018 0.4  0.0 - 30.0 mg/g Final  . Sodium 01/15/2018 138  135 - 145 mEq/L Final  . Potassium 01/15/2018 3.6  3.5 - 5.1 mEq/L Final  . Chloride 01/15/2018 99  96 - 112 mEq/L Final  . CO2 01/15/2018 30  19 - 32 mEq/L Final  . Glucose, Bld 01/15/2018 129* 70 - 99 mg/dL Final  . BUN 41/32/440112/31/2019 13  6 - 23 mg/dL Final  . Creatinine, Ser 01/15/2018 0.97  0.40 - 1.50 mg/dL Final  . Total Bilirubin 01/15/2018 0.5  0.2 - 1.2 mg/dL Final  . Alkaline Phosphatase 01/15/2018 83  39 - 117 U/L Final  . AST 01/15/2018 18  0 - 37 U/L Final  . ALT 01/15/2018 15  0 - 53 U/L Final  . Total Protein 01/15/2018 7.5  6.0 - 8.3 g/dL Final  . Albumin 02/72/536612/31/2019 4.0  3.5 - 5.2 g/dL Final  . Calcium 44/03/474212/31/2019 9.6  8.4 - 10.5 mg/dL Final  . GFR 59/56/387512/31/2019 104.46  >60.00 mL/min Final  . Hgb A1c MFr Bld 01/15/2018 7.7* 4.6 - 6.5 % Final   Glycemic Control Guidelines for People with Diabetes:Non Diabetic:  <6%Goal of Therapy: <7%Additional Action Suggested:  >8%      Allergies as of 01/18/2018   No Known Allergies     Medication List       Accurate as of January 18, 2018  9:01 AM. Always use your most recent med list.        amLODipine 10 MG tablet Commonly known as:  NORVASC Take 10 mg by mouth daily.   atorvastatin 80 MG tablet Commonly known as:  LIPITOR   CALTRATE 600+D PLUS MINERALS 600-800 MG-UNIT Tabs Take by mouth.   empagliflozin 10 MG Tabs tablet Commonly known as:  JARDIANCE Take 10 mg by mouth daily.   glucose blood test strip Commonly known as:  CONTOUR NEXT TEST Use as instructed   Insulin Glargine 100 UNIT/ML Solostar Pen Commonly known as:  LANTUS SOLOSTAR INJECT 20 UNITS ONCE A DAY   metFORMIN 500 MG 24 hr tablet Commonly known as:  GLUCOPHAGE-XR Take 4 tablets (2,000 mg total) by mouth daily with supper.   multivitamin-iron-minerals-folic acid chewable tablet Chew 1 tablet by mouth daily.   OZEMPIC (1 MG/DOSE) 2 MG/1.5ML Sopn Generic  drug:  Semaglutide (1 MG/DOSE) INJECT 1MG  INTO THE SKIN ONCE A WEEK   valsartan-hydrochlorothiazide 320-25 MG tablet Commonly known as:  DIOVAN-HCT       Allergies: No Known Allergies  Past Medical History:  Diagnosis Date  . Diabetes mellitus   . Hyperlipidemia   . Hypertension     Past Surgical History:  Procedure Laterality Date  . LAPAROSCOPIC GASTRIC BANDING  01/29/2007    No family history on file.  Social History:  reports that he has never smoked. He has never used smokeless tobacco. He reports current alcohol use of about 5.0 standard drinks of alcohol per week. He reports that he does not use drugs.   Review of Systems   Lipid history: Not on any medications and LDL is below 100 and triglycerides are normal too    Lab Results  Component Value Date   CHOL 149 01/15/2018   HDL 37.00 (L) 01/15/2018   LDLCALC 85 01/15/2018   LDLDIRECT 71.0 06/20/2016   TRIG 136.0 01/15/2018   CHOLHDL 4 01/15/2018           Hypertension: Followed by PCP  Blood pressure appears better, has had previous difficulties with not being consistent with his treatment Does not monitor at home  BP Readings from Last 3 Encounters:  01/18/18 132/70  10/18/17 134/88  07/16/17 138/90    Most recent eye exam was recently in 12/18  Most recent foot exam: 10/18  LABS:  Lab on 01/15/2018  Component Date Value Ref Range Status  . Cholesterol 01/15/2018 149  0 - 200 mg/dL Final   ATP III Classification       Desirable:  < 200 mg/dL               Borderline High:  200 - 239 mg/dL          High:  > = 161 mg/dL  . Triglycerides 01/15/2018 136.0  0.0 - 149.0 mg/dL Final   Normal:  <096 mg/dLBorderline High:  150 - 199 mg/dL  . HDL 01/15/2018 37.00* >39.00 mg/dL Final  . VLDL 04/54/0981 27.2  0.0 - 40.0 mg/dL Final  . LDL Cholesterol 01/15/2018 85  0 - 99 mg/dL Final  . Total CHOL/HDL Ratio 01/15/2018 4   Final                  Men          Women1/2 Average Risk     3.4           3.3Average Risk          5.0          4.42X Average Risk          9.6          7.13X Average Risk          15.0          11.0                      .  NonHDL 01/15/2018 112.38   Final   NOTE:  Non-HDL goal should be 30 mg/dL higher than patient's LDL goal (i.e. LDL goal of < 70 mg/dL, would have non-HDL goal of < 100 mg/dL)  . Microalb, Ur 01/15/2018 <0.7  0.0 - 1.9 mg/dL Final  . Creatinine,U 16/10/9602 187.7  mg/dL Final  . Microalb Creat Ratio 01/15/2018 0.4  0.0 - 30.0 mg/g Final  . Sodium 01/15/2018 138  135 - 145 mEq/L Final  . Potassium 01/15/2018 3.6  3.5 - 5.1 mEq/L Final  . Chloride 01/15/2018 99  96 - 112 mEq/L Final  . CO2 01/15/2018 30  19 - 32 mEq/L Final  . Glucose, Bld 01/15/2018 129* 70 - 99 mg/dL Final  . BUN 54/09/8117 13  6 - 23 mg/dL Final  . Creatinine, Ser 01/15/2018 0.97  0.40 - 1.50 mg/dL Final  . Total Bilirubin 01/15/2018 0.5  0.2 - 1.2 mg/dL Final  . Alkaline Phosphatase 01/15/2018 83  39 - 117 U/L Final  . AST 01/15/2018 18  0 - 37 U/L Final  . ALT 01/15/2018 15  0 - 53 U/L Final  . Total Protein 01/15/2018 7.5  6.0 - 8.3 g/dL Final  . Albumin 14/78/2956 4.0  3.5 - 5.2 g/dL Final  . Calcium 21/30/8657 9.6  8.4 - 10.5 mg/dL Final  . GFR 84/69/6295 104.46  >60.00 mL/min Final  . Hgb A1c MFr Bld 01/15/2018 7.7* 4.6 - 6.5 % Final   Glycemic Control Guidelines for People with Diabetes:Non Diabetic:  <6%Goal of Therapy: <7%Additional Action Suggested:  >8%     Physical Examination:  BP 132/70 (BP Location: Left Arm, Patient Position: Sitting, Cuff Size: Large)   Pulse 80   Ht 6\' 4"  (1.93 m)   Wt 283 lb 6.4 oz (128.5 kg)   SpO2 96%   BMI 34.50 kg/m     ASSESSMENT:  Diabetes type 2, uncontrolled, with obesity  See history of present illness for detailed discussion of current diabetes management, blood sugar patterns and problems identified  His A1c is 7.7 although his blood sugars are averaging only about 140 at home fasting  He does not check his sugars  enough Also likely not taking his insulin consistently and at variable times He has not exercise Occasionally will go off his diet with blood sugars as high as 173 at bedtime He does not want to increase his Jardiance because of previous difficulties with balanitis with 25 mg   HYPERTENSION: Followed by PCP  No history of microalbuminuria   PLAN:    He will need to take switch Lantus to [place] since he may be able to take it at variable times due to the longer half-life  Also discussed that if his morning readings are below 190 with see above he may be able to reduce the dose at least 2 units  He needs to check his sugars after dinner more often to help him comply with his diet  Regular exercise  No change in Jardiance, Ozempic or metformin  Follow-up in 3 months for A1c     There are no Patient Instructions on file for this visit.  Reather Littler 01/18/2018, 9:01 AM   Note: This office note was prepared with Dragon voice recognition system technology. Any transcriptional errors that result from this process are unintentional.

## 2018-01-18 ENCOUNTER — Ambulatory Visit: Payer: BLUE CROSS/BLUE SHIELD | Admitting: Endocrinology

## 2018-01-18 ENCOUNTER — Encounter: Payer: Self-pay | Admitting: Endocrinology

## 2018-01-18 VITALS — BP 132/70 | HR 80 | Ht 76.0 in | Wt 283.4 lb

## 2018-01-18 DIAGNOSIS — E1165 Type 2 diabetes mellitus with hyperglycemia: Secondary | ICD-10-CM

## 2018-01-18 DIAGNOSIS — Z794 Long term (current) use of insulin: Secondary | ICD-10-CM

## 2018-01-18 MED ORDER — INSULIN DEGLUDEC 100 UNIT/ML ~~LOC~~ SOPN: 22.0000 [IU] | PEN_INJECTOR | Freq: Every day | SUBCUTANEOUS | 1 refills | Status: DC

## 2018-01-18 NOTE — Patient Instructions (Addendum)
Check blood sugars on waking up 3 days a week  Also check blood sugars about 2 hours after meals and do this after different meals by rotation  Recommended blood sugar levels on waking up are 90-130 and about 2 hours after meal is 130-160  Please bring your blood sugar monitor to each visit, thank you  EXERCISE DAILY  TRESIBA 22, REPLACES LANTUS

## 2018-01-28 ENCOUNTER — Other Ambulatory Visit: Payer: Self-pay | Admitting: Endocrinology

## 2018-03-04 ENCOUNTER — Other Ambulatory Visit: Payer: Self-pay | Admitting: Endocrinology

## 2018-04-02 ENCOUNTER — Other Ambulatory Visit: Payer: Self-pay | Admitting: Endocrinology

## 2018-04-15 ENCOUNTER — Other Ambulatory Visit (INDEPENDENT_AMBULATORY_CARE_PROVIDER_SITE_OTHER): Payer: BLUE CROSS/BLUE SHIELD

## 2018-04-15 ENCOUNTER — Other Ambulatory Visit: Payer: Self-pay

## 2018-04-15 DIAGNOSIS — E1165 Type 2 diabetes mellitus with hyperglycemia: Secondary | ICD-10-CM | POA: Diagnosis not present

## 2018-04-15 DIAGNOSIS — Z794 Long term (current) use of insulin: Secondary | ICD-10-CM

## 2018-04-15 LAB — BASIC METABOLIC PANEL
BUN: 16 mg/dL (ref 6–23)
CO2: 35 mEq/L — ABNORMAL HIGH (ref 19–32)
Calcium: 10 mg/dL (ref 8.4–10.5)
Chloride: 97 mEq/L (ref 96–112)
Creatinine, Ser: 1.05 mg/dL (ref 0.40–1.50)
GFR: 89.61 mL/min (ref >60.00)
Glucose, Bld: 135 mg/dL — ABNORMAL HIGH (ref 70–99)
Potassium: 3.9 mEq/L (ref 3.5–5.1)
Sodium: 140 mEq/L (ref 135–145)

## 2018-04-15 LAB — HEMOGLOBIN A1C: Hgb A1c MFr Bld: 8.1 % — ABNORMAL HIGH (ref 4.6–6.5)

## 2018-04-16 LAB — FRUCTOSAMINE: Fructosamine: 295 umol/L — ABNORMAL HIGH (ref 0–285)

## 2018-04-16 NOTE — Progress Notes (Signed)
Reason for Appointment: Follow-up for Type 2 Diabetes  Referring physician: Ehinger   History of Present Illness:          Date of diagnosis of type 2 diabetes mellitus:?  1997        Background history:   He was symptomatic at diagnosis with increased urination He has been mostly treated with metformin and glipizide in the past He was taking Januvia more recently but this was changed in 2017 2 Onglyza because of insurance preference He does not think his sugars have been consistently controlled in the past, best A1c recently appears to be in 05/2014 which was 7.5 but subsequently has been at least 8%  Recent history:   LANTUS insulin: 22 units daily at variable times  Non-insulin hypoglycemic drugs: Metformin ER 500, 4 tablets in a.m., Jardiance 10 mg daily, Ozempic 1 mg weekly  His A1c has been gradually going up and now 8.1  Current management, blood sugar patterns and problems identified:  He is still taking Lantus and has not been on [place] as instructed since he did not run out  He will again miss some doses and not clear how often  He does admit that he is at home and tending to snack more at variable times although more in the evening now  He is also not checking his sugars much and likely not much after meals  He was told to try doubling up on his 10 mg Jardiance for better benefits and to let us know if he needed to 25 mg if tolerated but he still is afraid of getting balanitis  He is usually taking his Ozempic consistently every Sunday  He is not exercising as directed although he can access his gym, has only done it a couple of times last week  No side effects from Ozempic        Side effects from medications have been: none  Compliance with the medical regimen: Inadequate Hypoglycemia:   never  Glucose monitoring: Using contour meter, blood sugars by recall as above   Lab glucose 135 fasting   Self-care: The diet that the patient has  been following is: tries to limit fast food usually .     Meal times are:  Breakfast is at 9 AM-12 pm Lunch: 2-4 PM Dinner:  6-8 PM              Dietician visit, most recent: 9/17               Exercise:  Rarely  Weight history:   He had a gastric band in 2009 but this was loosened a couple of years ago because with taking metformin he would get uncomfortably bloated and has gained weight  Wt Readings from Last 3 Encounters:  04/17/18 280 lb 6.4 oz (127.2 kg)  01/18/18 283 lb 6.4 oz (128.5 kg)  10/18/17 286 lb (129.7 kg)    Glycemic control:    Lab Results  Component Value Date   HGBA1C 8.1 (H) 04/15/2018   HGBA1C 7.7 (H) 01/15/2018   HGBA1C 7.9 (H) 10/15/2017   Lab Results  Component Value Date   MICROALBUR <0.7 01/15/2018   LDLCALC 85 01/15/2018   CREATININE 1.05 04/15/2018   Lab Results  Component Value Date   MICRALBCREAT 0.4 01/15/2018    Lab on 04/15/2018  Component Date Value Ref Range Status  . Sodium 04/15/2018 140  135 - 145 mEq/L Final  .  Potassium 04/15/2018 3.9  3.5 - 5.1 mEq/L Final  . Chloride 04/15/2018 97  96 - 112 mEq/L Final  . CO2 04/15/2018 35* 19 - 32 mEq/L Final  . Glucose, Bld 04/15/2018 135* 70 - 99 mg/dL Final  . BUN 15/40/0867 16  6 - 23 mg/dL Final  . Creatinine, Ser 04/15/2018 1.05  0.40 - 1.50 mg/dL Final  . Calcium 61/95/0932 10.0  8.4 - 10.5 mg/dL Final  . GFR 67/12/4578 89.61  >60.00 mL/min Final  . Fructosamine 04/15/2018 295* 0 - 285 umol/L Final   Comment: Published reference interval for apparently healthy subjects between age 27 and 37 is 76 - 285 umol/L and in a poorly controlled diabetic population is 228 - 563 umol/L with a mean of 396 umol/L.   Marland Kitchen Hgb A1c MFr Bld 04/15/2018 8.1* 4.6 - 6.5 % Final   Glycemic Control Guidelines for People with Diabetes:Non Diabetic:  <6%Goal of Therapy: <7%Additional Action Suggested:  >8%      Allergies as of 04/17/2018   No Known Allergies     Medication List       Accurate as of  April 17, 2018  8:29 AM. Always use your most recent med list.        amLODipine 10 MG tablet Commonly known as:  NORVASC Take 10 mg by mouth daily.   atorvastatin 80 MG tablet Commonly known as:  LIPITOR   Caltrate 600+D Plus Minerals 600-800 MG-UNIT Tabs Take by mouth.   empagliflozin 10 MG Tabs tablet Commonly known as:  Jardiance Take 10 mg by mouth daily.   glucose blood test strip Commonly known as:  Contour Next Test Use as instructed   Insulin Glargine 100 UNIT/ML Solostar Pen Commonly known as:  Lantus SoloStar INJECT 20 UNITS ONCE A DAY   metFORMIN 500 MG 24 hr tablet Commonly known as:  GLUCOPHAGE-XR Take 4 tablets (2,000 mg total) by mouth daily with supper.   multivitamin-iron-minerals-folic acid chewable tablet Chew 1 tablet by mouth daily.   Ozempic (1 MG/DOSE) 2 MG/1.5ML Sopn Generic drug:  Semaglutide (1 MG/DOSE) INJECT INTO THE SKIN SUBCUTANEOUSLY ONCE A WEEK   valsartan-hydrochlorothiazide 320-25 MG tablet Commonly known as:  DIOVAN-HCT       Allergies: No Known Allergies  Past Medical History:  Diagnosis Date  . Diabetes mellitus   . Hyperlipidemia   . Hypertension     Past Surgical History:  Procedure Laterality Date  . LAPAROSCOPIC GASTRIC BANDING  01/29/2007    History reviewed. No pertinent family history.  Social History:  reports that he has never smoked. He has never used smokeless tobacco. He reports current alcohol use of about 5.0 standard drinks of alcohol per week. He reports that he does not use drugs.   Review of Systems   Lipid history: Not on any medications and LDL is below 100 and triglycerides are normal too    Lab Results  Component Value Date   CHOL 149 01/15/2018   HDL 37.00 (L) 01/15/2018   LDLCALC 85 01/15/2018   LDLDIRECT 71.0 06/20/2016   TRIG 136.0 01/15/2018   CHOLHDL 4 01/15/2018           Hypertension: Followed by PCP  Blood pressure appears relatively lower on Diovan HCT and amlodipine  Does not monitor at home  BP Readings from Last 3 Encounters:  04/17/18 110/76  01/18/18 132/70  10/18/17 134/88    Most recent eye exam was recently in 12/18  Most recent foot exam: 12/19  LABS:  Lab on 04/15/2018  Component Date Value Ref Range Status  . Sodium 04/15/2018 140  135 - 145 mEq/L Final  . Potassium 04/15/2018 3.9  3.5 - 5.1 mEq/L Final  . Chloride 04/15/2018 97  96 - 112 mEq/L Final  . CO2 04/15/2018 35* 19 - 32 mEq/L Final  . Glucose, Bld 04/15/2018 135* 70 - 99 mg/dL Final  . BUN 16/10/960403/30/2020 16  6 - 23 mg/dL Final  . Creatinine, Ser 04/15/2018 1.05  0.40 - 1.50 mg/dL Final  . Calcium 54/09/811903/30/2020 10.0  8.4 - 10.5 mg/dL Final  . GFR 14/78/295603/30/2020 89.61  >60.00 mL/min Final  . Fructosamine 04/15/2018 295* 0 - 285 umol/L Final   Comment: Published reference interval for apparently healthy subjects between age 53 and 3860 is 66205 - 285 umol/L and in a poorly controlled diabetic population is 228 - 563 umol/L with a mean of 396 umol/L.   Marland Kitchen. Hgb A1c MFr Bld 04/15/2018 8.1* 4.6 - 6.5 % Final   Glycemic Control Guidelines for People with Diabetes:Non Diabetic:  <6%Goal of Therapy: <7%Additional Action Suggested:  >8%      Physical Examination:  BP 110/76 (BP Location: Left Arm, Patient Position: Sitting, Cuff Size: Normal)   Pulse 78   Ht 6\' 4"  (1.93 m)   Wt 280 lb 6.4 oz (127.2 kg)   SpO2 98%   BMI 34.13 kg/m     ASSESSMENT:  Diabetes type 2, uncontrolled, with obesity  See history of present illness for detailed discussion of current diabetes management, blood sugar patterns and problems identified  His A1c is 7.7 although his blood sugars are averaging only about 140 at home fasting  He does not check his sugars enough Also likely not taking his insulin consistently and at variable times He has not exercise Occasionally will go off his diet with blood sugars as high as 173 at bedtime He does not want to increase his Jardiance because of previous  difficulties with balanitis with 25 mg   HYPERTENSION: Blood pressure is low normal now, followed by PCP  Leg pains: He will follow-up with PCP.  Also likely needs treatment for depression   PLAN:    As discussed before he needs to start exercising regularly up to 5 days a week  Needs to try and cut back on portions and snacks  More consistent monitoring including after meals  Take 2 tablets of Jardiance in the morning and let us know if he has any side effects otherwise we can send in a prescription for 25 mg daily  Follow instructions given by dietitian  Switch to [place] and adjust based on fasting reading  For now can reduce amlodipine to half a tablet when increasing Jardiance  Follow-up in 3 months for A1c  Counseling time on subjects discussed in assessment and plan sections is over 50% of today's 15 minute visit   Patient Instructions  Check blood sugars on waking up 2days a week  Also check blood sugars about 2 hours after meals and do this after different meals by rotation  Recommended blood sugar levels on waking up are 80-130 and about 2 hours after meal is 130-160  Please bring your blood sugar monitor to each visit, thank you  Try 2 Jardiance in am and  More fluids  Take 1/2 amlodipine  Review diet   Tresiba 20 daily in am   Reather LittlerAjay Jeanette Rauth 04/17/2018, 8:29 AM   Note: This office note was prepared with Dragon voice recognition system technology. Any  transcriptional errors that result from this process are unintentional.

## 2018-04-17 ENCOUNTER — Encounter: Payer: Self-pay | Admitting: Endocrinology

## 2018-04-17 ENCOUNTER — Ambulatory Visit: Payer: BLUE CROSS/BLUE SHIELD | Admitting: Endocrinology

## 2018-04-17 ENCOUNTER — Other Ambulatory Visit: Payer: Self-pay

## 2018-04-17 VITALS — BP 110/76 | HR 78 | Ht 76.0 in | Wt 280.4 lb

## 2018-04-17 DIAGNOSIS — E1165 Type 2 diabetes mellitus with hyperglycemia: Secondary | ICD-10-CM | POA: Diagnosis not present

## 2018-04-17 DIAGNOSIS — Z794 Long term (current) use of insulin: Secondary | ICD-10-CM | POA: Diagnosis not present

## 2018-04-17 NOTE — Patient Instructions (Addendum)
Check blood sugars on waking up 2days a week  Also check blood sugars about 2 hours after meals and do this after different meals by rotation  Recommended blood sugar levels on waking up are 80-130 and about 2 hours after meal is 130-160  Please bring your blood sugar monitor to each visit, thank you  Try 2 Jardiance in am and  More fluids  Take 1/2 amlodipine  Review diet   Tresiba 20 daily in am

## 2018-04-30 ENCOUNTER — Other Ambulatory Visit: Payer: Self-pay | Admitting: Endocrinology

## 2018-06-05 ENCOUNTER — Other Ambulatory Visit: Payer: Self-pay | Admitting: Endocrinology

## 2018-07-10 ENCOUNTER — Other Ambulatory Visit: Payer: Self-pay | Admitting: Endocrinology

## 2018-07-15 ENCOUNTER — Other Ambulatory Visit: Payer: Self-pay

## 2018-07-15 ENCOUNTER — Other Ambulatory Visit (INDEPENDENT_AMBULATORY_CARE_PROVIDER_SITE_OTHER): Payer: BLUE CROSS/BLUE SHIELD

## 2018-07-15 DIAGNOSIS — E1165 Type 2 diabetes mellitus with hyperglycemia: Secondary | ICD-10-CM | POA: Diagnosis not present

## 2018-07-15 DIAGNOSIS — Z794 Long term (current) use of insulin: Secondary | ICD-10-CM

## 2018-07-15 LAB — BASIC METABOLIC PANEL
BUN: 11 mg/dL (ref 6–23)
CO2: 29 mEq/L (ref 19–32)
Calcium: 9.1 mg/dL (ref 8.4–10.5)
Chloride: 100 mEq/L (ref 96–112)
Creatinine, Ser: 0.97 mg/dL (ref 0.40–1.50)
GFR: 98.09 mL/min (ref >60.00)
Glucose, Bld: 145 mg/dL — ABNORMAL HIGH (ref 70–99)
Potassium: 3.8 mEq/L (ref 3.5–5.1)
Sodium: 141 mEq/L (ref 135–145)

## 2018-07-15 LAB — HEMOGLOBIN A1C: Hgb A1c MFr Bld: 7.7 % — ABNORMAL HIGH (ref 4.6–6.5)

## 2018-07-18 ENCOUNTER — Encounter: Payer: Self-pay | Admitting: Endocrinology

## 2018-07-18 ENCOUNTER — Other Ambulatory Visit: Payer: Self-pay

## 2018-07-18 ENCOUNTER — Ambulatory Visit: Payer: BLUE CROSS/BLUE SHIELD | Admitting: Endocrinology

## 2018-07-18 VITALS — BP 122/82 | HR 77 | Ht 76.0 in | Wt 279.4 lb

## 2018-07-18 DIAGNOSIS — I10 Essential (primary) hypertension: Secondary | ICD-10-CM

## 2018-07-18 DIAGNOSIS — Z794 Long term (current) use of insulin: Secondary | ICD-10-CM

## 2018-07-18 DIAGNOSIS — E1165 Type 2 diabetes mellitus with hyperglycemia: Secondary | ICD-10-CM

## 2018-07-18 NOTE — Patient Instructions (Addendum)
Tresiba 22 units daily  Check blood sugars on waking up 3 days a week  Also check blood sugars about 2 hours after meals and do this after different meals by rotation  Recommended blood sugar levels on waking up are 90-130 and about 2 hours after meal is 130-180  Please bring your blood sugar monitor to each visit, thank you  Walk daily  Check on eye exam

## 2018-07-18 NOTE — Progress Notes (Signed)
Reason for Appointment: Follow-up for Type 2 Diabetes  Referring physician: Ehinger   History of Present Illness:          Date of diagnosis of type 2 diabetes mellitus:?  1997        Background history:   He was symptomatic at diagnosis with increased urination He has been mostly treated with metformin and glipizide in the past He was taking Januvia more recently but this was changed in 2017 2 Onglyza because of insurance preference He does not think his sugars have been consistently controlled in the past, best A1c recently appears to be in 05/2014 which was 7.5 but subsequently has been at least 8%  Recent history:   TRESIBA insulin: 20units daily  Non-insulin hypoglycemic drugs: Metformin ER 500, 4 tablets in a.m., Jardiance 10 mg daily, Ozempic 1 mg weekly  His A1c has been consistently over 7% Currently it is 7.7   Current management, blood sugar patterns and problems identified:  He is now taking [place] instead of Lantus  He thinks he is taking this more regularly now  However his fasting blood sugars appear to be variable  Despite reminders he does not check his sugars much and has only 3 morning readings and 1 reading around midnight  His weight is about the same  He did try doubling up on his 10 mg Jardiance as directed but he says that it caused penile irritation and did not continue this, currently having a balanitis  He does not appear to have any motivation to start any exercise despite repeated reminders  Also no side effects with metformin  No side effects from Ozempic        Side effects from medications have been: none  Compliance with the medical regimen: Inadequate Hypoglycemia:   never  Glucose monitoring: Using contour meter, blood sugars as below  FASTING blood sugars ranging 127-161 with only 3 readings Midnight 161   Lab glucose 145 fasting   Self-care: The diet that the patient has been following is: tries to limit  fast food usually .     Meal times are:  Breakfast is at 9 AM-12 pm Lunch: 2-4 PM Dinner:  6-8 PM              Dietician visit, most recent: 9/17               Exercise:  Rarely  Weight history:   He had a gastric band in 2009 but this was loosened a couple of years ago because with taking metformin he would get uncomfortably bloated and has gained weight  Wt Readings from Last 3 Encounters:  07/18/18 279 lb 6.4 oz (126.7 kg)  04/17/18 280 lb 6.4 oz (127.2 kg)  01/18/18 283 lb 6.4 oz (128.5 kg)    Glycemic control:    Lab Results  Component Value Date   HGBA1C 7.7 (H) 07/15/2018   HGBA1C 8.1 (H) 04/15/2018   HGBA1C 7.7 (H) 01/15/2018   Lab Results  Component Value Date   MICROALBUR <0.7 01/15/2018   LDLCALC 85 01/15/2018   CREATININE 0.97 07/15/2018   Lab Results  Component Value Date   MICRALBCREAT 0.4 01/15/2018    Lab on 07/15/2018  Component Date Value Ref Range Status  . Sodium 07/15/2018 141  135 - 145 mEq/L Final  . Potassium 07/15/2018 3.8  3.5 - 5.1 mEq/L Final  . Chloride 07/15/2018 100  96 - 112 mEq/L Final  .  CO2 07/15/2018 29  19 - 32 mEq/L Final  . Glucose, Bld 07/15/2018 145* 70 - 99 mg/dL Final  . BUN 16/10/960406/29/2020 11  6 - 23 mg/dL Final  . Creatinine, Ser 07/15/2018 0.97  0.40 - 1.50 mg/dL Final  . Calcium 54/09/811906/29/2020 9.1  8.4 - 10.5 mg/dL Final  . GFR 14/78/295606/29/2020 98.09  >60.00 mL/min Final  . Hgb A1c MFr Bld 07/15/2018 7.7* 4.6 - 6.5 % Final   Glycemic Control Guidelines for People with Diabetes:Non Diabetic:  <6%Goal of Therapy: <7%Additional Action Suggested:  >8%      Allergies as of 07/18/2018   No Known Allergies     Medication List       Accurate as of July 18, 2018 10:57 AM. If you have any questions, ask your nurse or doctor.        STOP taking these medications   Insulin Glargine 100 UNIT/ML Solostar Pen Commonly known as: Lantus SoloStar Stopped by: Reather LittlerAjay Esley Brooking, MD     TAKE these medications   amLODipine 10 MG tablet Commonly  known as: NORVASC Take 10 mg by mouth daily.   atorvastatin 80 MG tablet Commonly known as: LIPITOR   Caltrate 600+D Plus Minerals 600-800 MG-UNIT Tabs Take by mouth.   empagliflozin 10 MG Tabs tablet Commonly known as: Jardiance Take 10 mg by mouth daily.   glucose blood test strip Commonly known as: Contour Next Test Use as instructed   metFORMIN 500 MG 24 hr tablet Commonly known as: GLUCOPHAGE-XR Take 4 tablets (2,000 mg total) by mouth daily with supper. What changed: additional instructions   multivitamin-iron-minerals-folic acid chewable tablet Chew 1 tablet by mouth daily.   Ozempic (1 MG/DOSE) 2 MG/1.5ML Sopn Generic drug: Semaglutide (1 MG/DOSE) inject 1mg  into the skin subcutaneously once a week   Tresiba FlexTouch 100 UNIT/ML Sopn FlexTouch Pen Generic drug: insulin degludec Inject 20 Units into the skin daily. Inject 20 units under the skin once daily.   valsartan-hydrochlorothiazide 320-25 MG tablet Commonly known as: DIOVAN-HCT       Allergies: No Known Allergies  Past Medical History:  Diagnosis Date  . Diabetes mellitus   . Hyperlipidemia   . Hypertension     Past Surgical History:  Procedure Laterality Date  . LAPAROSCOPIC GASTRIC BANDING  01/29/2007    History reviewed. No pertinent family history.  Social History:  reports that he has never smoked. He has never used smokeless tobacco. He reports current alcohol use of about 5.0 standard drinks of alcohol per week. He reports that he does not use drugs.   Review of Systems   Lipid history: Not on any medications and LDL is below 100 and triglycerides are normal too    Lab Results  Component Value Date   CHOL 149 01/15/2018   HDL 37.00 (L) 01/15/2018   LDLCALC 85 01/15/2018   LDLDIRECT 71.0 06/20/2016   TRIG 136.0 01/15/2018   CHOLHDL 4 01/15/2018           Hypertension: Followed by PCP  Is on Diovan HCT and amlodipine Does not monitor at home  BP Readings from Last 3  Encounters:  07/18/18 122/82  04/17/18 110/76  01/18/18 132/70    Most recent eye exam was in 12/18, has not scheduled follow-up  Most recent foot exam: 12/19  LABS:  Lab on 07/15/2018  Component Date Value Ref Range Status  . Sodium 07/15/2018 141  135 - 145 mEq/L Final  . Potassium 07/15/2018 3.8  3.5 - 5.1 mEq/L Final  .  Chloride 07/15/2018 100  96 - 112 mEq/L Final  . CO2 07/15/2018 29  19 - 32 mEq/L Final  . Glucose, Bld 07/15/2018 145* 70 - 99 mg/dL Final  . BUN 16/10/960406/29/2020 11  6 - 23 mg/dL Final  . Creatinine, Ser 07/15/2018 0.97  0.40 - 1.50 mg/dL Final  . Calcium 54/09/811906/29/2020 9.1  8.4 - 10.5 mg/dL Final  . GFR 14/78/295606/29/2020 98.09  >60.00 mL/min Final  . Hgb A1c MFr Bld 07/15/2018 7.7* 4.6 - 6.5 % Final   Glycemic Control Guidelines for People with Diabetes:Non Diabetic:  <6%Goal of Therapy: <7%Additional Action Suggested:  >8%      Physical Examination:  BP 122/82 (BP Location: Left Arm, Patient Position: Sitting, Cuff Size: Large)   Pulse 77   Ht 6\' 4"  (1.93 m)   Wt 279 lb 6.4 oz (126.7 kg)   SpO2 94%   BMI 34.01 kg/m     ASSESSMENT:  Diabetes type 2, uncontrolled, with obesity  See history of present illness for detailed discussion of current diabetes management, blood sugar patterns and problems identified  His A1c is 7.7  He likely has postprandial hyperglycemia causing his A1c to be relatively high Discussed A1c target of 7% Also explained to him the importance of checking sugars after meals since his fasting readings are consistently high However he is not motivated to work on his weight loss with diet and exercise regimen  Currently limited in his ability to increase any medications including Jardiance and Ozempic Explained to him that basal insulin does not control his postprandial readings   HYPERTENSION: Blood pressure well controlled  Reminded him to make an exam with the ophthalmologist   PLAN:   Emphasized the need to exercise with at  least brisk walking or some other sporting activities Check readings at least every other day and some readings after meals Checking readings after meals may help him with more consistent diet also Discussed the possibility of taking mealtime insulin if his blood sugars are consistently over 200 after meals Increase Tresiba to 22 units  Follow-up in 3 months for A1c     Patient Instructions  Tresiba 22 units daily  Check blood sugars on waking up 3 days a week  Also check blood sugars about 2 hours after meals and do this after different meals by rotation  Recommended blood sugar levels on waking up are 90-130 and about 2 hours after meal is 130-180  Please bring your blood sugar monitor to each visit, thank you  Walk daily  Check on eye exam     Reather Littlerjay Markos Theil 07/18/2018, 10:57 AM   Note: This office note was prepared with Dragon voice recognition system technology. Any transcriptional errors that result from this process are unintentional.

## 2018-09-04 ENCOUNTER — Other Ambulatory Visit: Payer: Self-pay | Admitting: Endocrinology

## 2018-10-16 ENCOUNTER — Other Ambulatory Visit: Payer: Self-pay

## 2018-10-16 ENCOUNTER — Other Ambulatory Visit (INDEPENDENT_AMBULATORY_CARE_PROVIDER_SITE_OTHER): Payer: BLUE CROSS/BLUE SHIELD

## 2018-10-16 DIAGNOSIS — Z794 Long term (current) use of insulin: Secondary | ICD-10-CM | POA: Diagnosis not present

## 2018-10-16 DIAGNOSIS — E1165 Type 2 diabetes mellitus with hyperglycemia: Secondary | ICD-10-CM | POA: Diagnosis not present

## 2018-10-16 LAB — HEMOGLOBIN A1C: Hgb A1c MFr Bld: 8.9 % — ABNORMAL HIGH (ref 4.6–6.5)

## 2018-10-16 LAB — COMPREHENSIVE METABOLIC PANEL
ALT: 19 U/L (ref 0–53)
AST: 15 U/L (ref 0–37)
Albumin: 4.3 g/dL (ref 3.5–5.2)
Alkaline Phosphatase: 95 U/L (ref 39–117)
BUN: 13 mg/dL (ref 6–23)
CO2: 33 mEq/L — ABNORMAL HIGH (ref 19–32)
Calcium: 10.3 mg/dL (ref 8.4–10.5)
Chloride: 99 mEq/L (ref 96–112)
Creatinine, Ser: 1.1 mg/dL (ref 0.40–1.50)
GFR: 84.76 mL/min (ref >60.00)
Glucose, Bld: 152 mg/dL — ABNORMAL HIGH (ref 70–99)
Potassium: 4.5 mEq/L (ref 3.5–5.1)
Sodium: 142 mEq/L (ref 135–145)
Total Bilirubin: 0.6 mg/dL (ref 0.2–1.2)
Total Protein: 7.7 g/dL (ref 6.0–8.3)

## 2018-10-16 LAB — LIPID PANEL
Cholesterol: 149 mg/dL (ref 0–200)
HDL: 53.1 mg/dL (ref >39.00)
LDL Cholesterol: 63 mg/dL (ref 0–99)
NonHDL: 95.44
Total CHOL/HDL Ratio: 3
Triglycerides: 161 mg/dL — ABNORMAL HIGH (ref 0.0–149.0)
VLDL: 32.2 mg/dL (ref 0.0–40.0)

## 2018-10-16 LAB — MICROALBUMIN / CREATININE URINE RATIO
Creatinine,U: 136.3 mg/dL
Microalb Creat Ratio: 0.5 mg/g (ref 0.0–30.0)
Microalb, Ur: <0.7 mg/dL (ref 0.0–1.9)

## 2018-10-18 ENCOUNTER — Ambulatory Visit (INDEPENDENT_AMBULATORY_CARE_PROVIDER_SITE_OTHER): Payer: BLUE CROSS/BLUE SHIELD | Admitting: Endocrinology

## 2018-10-18 ENCOUNTER — Other Ambulatory Visit: Payer: Self-pay

## 2018-10-18 ENCOUNTER — Encounter: Payer: Self-pay | Admitting: Endocrinology

## 2018-10-18 VITALS — BP 104/82 | HR 72 | Ht 76.0 in | Wt 296.8 lb

## 2018-10-18 DIAGNOSIS — E1165 Type 2 diabetes mellitus with hyperglycemia: Secondary | ICD-10-CM

## 2018-10-18 DIAGNOSIS — Z794 Long term (current) use of insulin: Secondary | ICD-10-CM

## 2018-10-18 NOTE — Patient Instructions (Signed)
Check blood sugars on waking up 3-4  days a week  Also check blood sugars about 2 hours after meals and do this after different meals by rotation  Recommended blood sugar levels on waking up are 90-130 and about 2 hours after meal is 130-160  Please bring your blood sugar monitor to each visit, thank you  Take 24 units Tyler Aas

## 2018-10-18 NOTE — Progress Notes (Signed)
Reason for Appointment: Follow-up for Type 2 Diabetes  Referring physician: Ehinger   History of Present Illness:          Date of diagnosis of type 2 diabetes mellitus:?  1997        Background history:   He was symptomatic at diagnosis with increased urination He has been mostly treated with metformin and glipizide in the past He was taking Januvia more recently but this was changed in 2017 2 Onglyza because of insurance preference He does not think his sugars have been consistently controlled in the past, best A1c recently appears to be in 05/2014 which was 7.5 but subsequently has been at least 8%  Recent history:   TRESIBA insulin: 22 units daily  Non-insulin hypoglycemic drugs: Metformin ER 500, 4 tablets in a.m., Jardiance 10 mg daily, Ozempic 1 mg weekly  His A1c has been consistently over 7% Currently it is 8.9   Current management, blood sugar patterns and problems identified:  He has gained 17 pounds since her last visit  Likely has not been watching his diet at all and he did not realize he had gained his weight  Also not exercising again  He will be starting back to going to the gym this week  However some of his high sugars may be from his intermittently going off Jardiance for a week or so when he gets balanitis even with 10 mg daily  He does take his Ozempic regularly  His blood sugars at home are mostly in the 130+ range recently  Glucose of 201 when he ran out of [place] for 3 days  Has only 1 reading at dinnertime and no readings after meals as before  He does not think his the strips are out of date        Side effects from medications have been: none  Compliance with the medical regimen: Inadequate Hypoglycemia:   never  Glucose monitoring: Using contour meter, blood sugars as below  FASTING blood sugars recently 136-137 Also had a 200 reading on 9/14 6 PM = 129  Lab glucose 152 fasting   Self-care: The diet that the  patient has been following is: tries to limit fast food usually .     Meal times are:  Breakfast is at 9 AM-12 pm Lunch: 2-4 PM Dinner:  6-8 PM              Dietician visit, most recent: 9/17               Exercise:  Rarely  Weight history:   He had a gastric band in 2009 but this was loosened a couple of years ago because with taking metformin he would get uncomfortably bloated and has gained weight  Wt Readings from Last 3 Encounters:  10/18/18 296 lb 12.8 oz (134.6 kg)  07/18/18 279 lb 6.4 oz (126.7 kg)  04/17/18 280 lb 6.4 oz (127.2 kg)    Glycemic control:    Lab Results  Component Value Date   HGBA1C 8.9 (H) 10/16/2018   HGBA1C 7.7 (H) 07/15/2018   HGBA1C 8.1 (H) 04/15/2018   Lab Results  Component Value Date   MICROALBUR <0.7 10/16/2018   LDLCALC 63 10/16/2018   CREATININE 1.10 10/16/2018   Lab Results  Component Value Date   MICRALBCREAT 0.5 10/16/2018    Lab on 10/16/2018  Component Date Value Ref Range Status  . Cholesterol 10/16/2018 149  0 -  200 mg/dL Final   ATP III Classification       Desirable:  < 200 mg/dL               Borderline High:  200 - 239 mg/dL          High:  > = 240 mg/dL  . Triglycerides 10/16/2018 161.0* 0.0 - 149.0 mg/dL Final   Normal:  <150 mg/dLBorderline High:  150 - 199 mg/dL  . HDL 10/16/2018 53.10  >39.00 mg/dL Final  . VLDL 10/16/2018 32.2  0.0 - 40.0 mg/dL Final  . LDL Cholesterol 10/16/2018 63  0 - 99 mg/dL Final  . Total CHOL/HDL Ratio 10/16/2018 3   Final                  Men          Women1/2 Average Risk     3.4          3.3Average Risk          5.0          4.42X Average Risk          9.6          7.13X Average Risk          15.0          11.0                      . NonHDL 10/16/2018 95.44   Final   NOTE:  Non-HDL goal should be 30 mg/dL higher than patient's LDL goal (i.e. LDL goal of < 70 mg/dL, would have non-HDL goal of < 100 mg/dL)  . Microalb, Ur 10/16/2018 <0.7  0.0 - 1.9 mg/dL Final  . Creatinine,U 10/16/2018  136.3  mg/dL Final  . Microalb Creat Ratio 10/16/2018 0.5  0.0 - 30.0 mg/g Final  . Sodium 10/16/2018 142  135 - 145 mEq/L Final  . Potassium 10/16/2018 4.5  3.5 - 5.1 mEq/L Final  . Chloride 10/16/2018 99  96 - 112 mEq/L Final  . CO2 10/16/2018 33* 19 - 32 mEq/L Final  . Glucose, Bld 10/16/2018 152* 70 - 99 mg/dL Final  . BUN 10/16/2018 13  6 - 23 mg/dL Final  . Creatinine, Ser 10/16/2018 1.10  0.40 - 1.50 mg/dL Final  . Total Bilirubin 10/16/2018 0.6  0.2 - 1.2 mg/dL Final  . Alkaline Phosphatase 10/16/2018 95  39 - 117 U/L Final  . AST 10/16/2018 15  0 - 37 U/L Final  . ALT 10/16/2018 19  0 - 53 U/L Final  . Total Protein 10/16/2018 7.7  6.0 - 8.3 g/dL Final  . Albumin 10/16/2018 4.3  3.5 - 5.2 g/dL Final  . Calcium 10/16/2018 10.3  8.4 - 10.5 mg/dL Final  . GFR 10/16/2018 84.76  >60.00 mL/min Final  . Hgb A1c MFr Bld 10/16/2018 8.9* 4.6 - 6.5 % Final   Glycemic Control Guidelines for People with Diabetes:Non Diabetic:  <6%Goal of Therapy: <7%Additional Action Suggested:  >8%      Allergies as of 10/18/2018   No Known Allergies     Medication List       Accurate as of October 18, 2018  8:28 AM. If you have any questions, ask your nurse or doctor.        amLODipine 10 MG tablet Commonly known as: NORVASC Take 10 mg by mouth daily.   atorvastatin 80 MG tablet Commonly known as: LIPITOR Take 80 mg by mouth  daily.   Caltrate 600+D Plus Minerals 600-800 MG-UNIT Tabs Take by mouth as needed.   empagliflozin 10 MG Tabs tablet Commonly known as: Jardiance Take 10 mg by mouth daily.   glucose blood test strip Commonly known as: Contour Next Test Use as instructed   metFORMIN 500 MG 24 hr tablet Commonly known as: GLUCOPHAGE-XR Take 4 tablets (2,000 mg total) by mouth daily with supper. What changed: additional instructions   multivitamin-iron-minerals-folic acid chewable tablet Chew 1 tablet by mouth daily.   Ozempic (1 MG/DOSE) 2 MG/1.5ML Sopn Generic drug:  Semaglutide (1 MG/DOSE) INJECT 1MG  INTO THE SKIN SUBCUTANEOUSLY ONCE A WEEK   Tresiba FlexTouch 100 UNIT/ML Sopn FlexTouch Pen Generic drug: insulin degludec Inject 22 Units into the skin daily. Inject 22 units under the skin once daily.   valsartan-hydrochlorothiazide 320-25 MG tablet Commonly known as: DIOVAN-HCT Take 1 tablet by mouth daily.       Allergies: No Known Allergies  Past Medical History:  Diagnosis Date  . Diabetes mellitus   . Hyperlipidemia   . Hypertension     Past Surgical History:  Procedure Laterality Date  . LAPAROSCOPIC GASTRIC BANDING  01/29/2007    No family history on file.  Social History:  reports that he has never smoked. He has never used smokeless tobacco. He reports current alcohol use of about 5.0 standard drinks of alcohol per week. He reports that he does not use drugs.   Review of Systems   Lipid history: Not on any medications and LDL is below 100 and triglycerides are normal too    Lab Results  Component Value Date   CHOL 149 10/16/2018   HDL 53.10 10/16/2018   LDLCALC 63 10/16/2018   LDLDIRECT 71.0 06/20/2016   TRIG 161.0 (H) 10/16/2018   CHOLHDL 3 10/16/2018           Hypertension: Followed by PCP  Is on Diovan HCT and amlodipine with good control Does not monitor at home  BP Readings from Last 3 Encounters:  10/18/18 104/82  07/18/18 122/82  04/17/18 110/76    Most recent eye exam was in 12/18, has not scheduled follow-up  Most recent foot exam: 12/19  LABS:  Lab on 10/16/2018  Component Date Value Ref Range Status  . Cholesterol 10/16/2018 149  0 - 200 mg/dL Final   ATP III Classification       Desirable:  < 200 mg/dL               Borderline High:  200 - 239 mg/dL          High:  > = 161240 mg/dL  . Triglycerides 10/16/2018 161.0* 0.0 - 149.0 mg/dL Final   Normal:  <096<150 mg/dLBorderline High:  150 - 199 mg/dL  . HDL 10/16/2018 53.10  >39.00 mg/dL Final  . VLDL 04/54/098109/30/2020 32.2  0.0 - 40.0 mg/dL Final  . LDL  Cholesterol 10/16/2018 63  0 - 99 mg/dL Final  . Total CHOL/HDL Ratio 10/16/2018 3   Final                  Men          Women1/2 Average Risk     3.4          3.3Average Risk          5.0          4.42X Average Risk          9.6  7.13X Average Risk          15.0          11.0                      . NonHDL 10/16/2018 95.44   Final   NOTE:  Non-HDL goal should be 30 mg/dL higher than patient's LDL goal (i.e. LDL goal of < 70 mg/dL, would have non-HDL goal of < 100 mg/dL)  . Microalb, Ur 10/16/2018 <0.7  0.0 - 1.9 mg/dL Final  . Creatinine,U 44/31/5400 136.3  mg/dL Final  . Microalb Creat Ratio 10/16/2018 0.5  0.0 - 30.0 mg/g Final  . Sodium 10/16/2018 142  135 - 145 mEq/L Final  . Potassium 10/16/2018 4.5  3.5 - 5.1 mEq/L Final  . Chloride 10/16/2018 99  96 - 112 mEq/L Final  . CO2 10/16/2018 33* 19 - 32 mEq/L Final  . Glucose, Bld 10/16/2018 152* 70 - 99 mg/dL Final  . BUN 86/76/1950 13  6 - 23 mg/dL Final  . Creatinine, Ser 10/16/2018 1.10  0.40 - 1.50 mg/dL Final  . Total Bilirubin 10/16/2018 0.6  0.2 - 1.2 mg/dL Final  . Alkaline Phosphatase 10/16/2018 95  39 - 117 U/L Final  . AST 10/16/2018 15  0 - 37 U/L Final  . ALT 10/16/2018 19  0 - 53 U/L Final  . Total Protein 10/16/2018 7.7  6.0 - 8.3 g/dL Final  . Albumin 93/26/7124 4.3  3.5 - 5.2 g/dL Final  . Calcium 58/09/9831 10.3  8.4 - 10.5 mg/dL Final  . GFR 82/50/5397 84.76  >60.00 mL/min Final  . Hgb A1c MFr Bld 10/16/2018 8.9* 4.6 - 6.5 % Final   Glycemic Control Guidelines for People with Diabetes:Non Diabetic:  <6%Goal of Therapy: <7%Additional Action Suggested:  >8%      Physical Examination:  BP 104/82 (BP Location: Right Arm, Patient Position: Sitting, Cuff Size: Large)   Pulse 72   Ht 6\' 4"  (1.93 m)   Wt 296 lb 12.8 oz (134.6 kg)   SpO2 97%   BMI 36.13 kg/m     ASSESSMENT:  Diabetes type 2, uncontrolled, with obesity  See history of present illness for detailed discussion of current diabetes management,  blood sugar patterns and problems identified  His A1c is 8.9 compared to 7.7  His blood sugars are not as well controlled likely from poor diet and weight gain As before he is not motivated to exercise although he thinks he can start going to the gym His diet is likely high fat and high calories and he is not currently motivated to do better  Not clear if his test strips are out of date as he checks very infrequently  HYPERTENSION: Blood pressure well controlled   He refuses influenza vaccine  PLAN:   He will try to take Jardiance more regularly May use nystatin as needed Start checking blood sugars daily Regular exercise Reduce total calorie intake, high-fat foods and high carbohydrate intake consistently Increase Tresiba to 24 units Discussed needing to get his morning sugars consistently below 130 at least  Follow-up in 3 months for A1c     There are no Patient Instructions on file for this visit.  June 10/18/2018, 8:28 AM   Note: This office note was prepared with Dragon voice recognition system technology. Any transcriptional errors that result from this process are unintentional.

## 2018-10-28 ENCOUNTER — Other Ambulatory Visit: Payer: Self-pay | Admitting: Endocrinology

## 2018-11-26 ENCOUNTER — Other Ambulatory Visit: Payer: Self-pay | Admitting: Endocrinology

## 2018-12-03 DIAGNOSIS — Z0279 Encounter for issue of other medical certificate: Secondary | ICD-10-CM

## 2018-12-30 ENCOUNTER — Other Ambulatory Visit: Payer: Self-pay | Admitting: Endocrinology

## 2019-01-20 ENCOUNTER — Other Ambulatory Visit (INDEPENDENT_AMBULATORY_CARE_PROVIDER_SITE_OTHER): Payer: 59

## 2019-01-20 ENCOUNTER — Other Ambulatory Visit: Payer: Self-pay

## 2019-01-20 DIAGNOSIS — Z794 Long term (current) use of insulin: Secondary | ICD-10-CM | POA: Diagnosis not present

## 2019-01-20 DIAGNOSIS — E1165 Type 2 diabetes mellitus with hyperglycemia: Secondary | ICD-10-CM

## 2019-01-20 LAB — HEMOGLOBIN A1C: Hgb A1c MFr Bld: 8.5 % — ABNORMAL HIGH (ref 4.6–6.5)

## 2019-01-20 LAB — BASIC METABOLIC PANEL
BUN: 8 mg/dL (ref 6–23)
CO2: 33 mEq/L — ABNORMAL HIGH (ref 19–32)
Calcium: 9.5 mg/dL (ref 8.4–10.5)
Chloride: 98 mEq/L (ref 96–112)
Creatinine, Ser: 0.82 mg/dL (ref 0.40–1.50)
GFR: 118.84 mL/min (ref >60.00)
Glucose, Bld: 202 mg/dL — ABNORMAL HIGH (ref 70–99)
Potassium: 4 mEq/L (ref 3.5–5.1)
Sodium: 138 mEq/L (ref 135–145)

## 2019-01-22 ENCOUNTER — Other Ambulatory Visit: Payer: Self-pay | Admitting: Cardiology

## 2019-01-22 DIAGNOSIS — Z20822 Contact with and (suspected) exposure to covid-19: Secondary | ICD-10-CM

## 2019-01-23 ENCOUNTER — Encounter: Payer: Self-pay | Admitting: Endocrinology

## 2019-01-23 ENCOUNTER — Other Ambulatory Visit: Payer: Self-pay

## 2019-01-23 ENCOUNTER — Ambulatory Visit (INDEPENDENT_AMBULATORY_CARE_PROVIDER_SITE_OTHER): Payer: 59 | Admitting: Endocrinology

## 2019-01-23 VITALS — BP 110/78 | HR 114 | Ht 76.0 in | Wt 292.2 lb

## 2019-01-23 DIAGNOSIS — Z794 Long term (current) use of insulin: Secondary | ICD-10-CM | POA: Diagnosis not present

## 2019-01-23 DIAGNOSIS — I10 Essential (primary) hypertension: Secondary | ICD-10-CM | POA: Diagnosis not present

## 2019-01-23 DIAGNOSIS — E1165 Type 2 diabetes mellitus with hyperglycemia: Secondary | ICD-10-CM | POA: Diagnosis not present

## 2019-01-23 MED ORDER — FLUCONAZOLE 150 MG PO TABS: 150.0000 mg | ORAL_TABLET | Freq: Once | ORAL | 3 refills | Status: AC

## 2019-01-23 NOTE — Patient Instructions (Signed)
Check blood sugars on waking up 3 days a week  Also check blood sugars about 2 hours after meals and do this after different meals by rotation  Recommended blood sugar levels on waking up are 90-130 and about 2 hours after meal is 130-160  Please bring your blood sugar monitor to each visit, thank you   

## 2019-01-23 NOTE — Progress Notes (Signed)
Reason for Appointment: Follow-up for Type 2 Diabetes  Referring physician: Ehinger   History of Present Illness:          Date of diagnosis of type 2 diabetes mellitus:?  1997        Background history:   He was symptomatic at diagnosis with increased urination He has been mostly treated with metformin and glipizide in the past He was taking Januvia more recently but this was changed in 2017 2 Onglyza because of insurance preference He does not think his sugars have been consistently controlled in the past, best A1c recently appears to be in 05/2014 which was 7.5 but subsequently has been at least 8%  Recent history:   TRESIBA insulin: 24 units daily  Non-insulin hypoglycemic drugs: Metformin ER 500, 4 tablets in a.m., Jardiance 10 mg daily, Ozempic 1 mg weekly  His A1c has been consistently over 7% Currently it is 8.5   Current management, blood sugar patterns and problems identified:  He has a fasting blood sugar of 202  However he states that he has not had any blood sugars over 155 at home  As before he did not bring his monitor for download  He thinks he checks his blood sugars periodically and sometimes after meals  However previous downloads have not shown any blood sugar readings usually  He also thinks he has relatively new test strips that are not expired  He has stopped taking Jardiance because of balanitis even with using nystatin cream  He did start going to the gym and is doing this 3 times or so per week  With this his weight is down 4 pounds  Does not think he has missed his [place] or Ozempic doses; likes to take his Metformin 2 g together in the morning        Side effects from medications have been: none  Compliance with the medical regimen: Inadequate Hypoglycemia:   never  Glucose monitoring: Using contour meter, blood sugars as below  FASTING blood sugars by recall 120-155 including at night  Lab glucose 202 fasting    Self-care: The diet that the patient has been following is: tries to limit fast food usually .     Meal times are:  Breakfast is at 9 AM-12 pm Lunch: 2-4 PM Dinner:  6-8 PM              Dietician visit, most recent: 9/17  Weight history:   He had a gastric band in 2009 but this was loosened a couple of years ago because with taking metformin he would get uncomfortably bloated and has gained weight  Wt Readings from Last 3 Encounters:  01/23/19 292 lb 3.2 oz (132.5 kg)  10/18/18 296 lb 12.8 oz (134.6 kg)  07/18/18 279 lb 6.4 oz (126.7 kg)    Glycemic control:    Lab Results  Component Value Date   HGBA1C 8.5 (H) 01/20/2019   HGBA1C 8.9 (H) 10/16/2018   HGBA1C 7.7 (H) 07/15/2018   Lab Results  Component Value Date   MICROALBUR <0.7 10/16/2018   LDLCALC 63 10/16/2018   CREATININE 0.82 01/20/2019   Lab Results  Component Value Date   MICRALBCREAT 0.5 10/16/2018    Lab on 01/20/2019  Component Date Value Ref Range Status  . Sodium 01/20/2019 138  135 - 145 mEq/L Final  . Potassium 01/20/2019 4.0  3.5 - 5.1 mEq/L Final  . Chloride 01/20/2019 98  96 -  112 mEq/L Final  . CO2 01/20/2019 33* 19 - 32 mEq/L Final  . Glucose, Bld 01/20/2019 202* 70 - 99 mg/dL Final  . BUN 01/20/2019 8  6 - 23 mg/dL Final  . Creatinine, Ser 01/20/2019 0.82  0.40 - 1.50 mg/dL Final  . GFR 01/20/2019 118.84  >60.00 mL/min Final  . Calcium 01/20/2019 9.5  8.4 - 10.5 mg/dL Final  . Hgb A1c MFr Bld 01/20/2019 8.5* 4.6 - 6.5 % Final   Glycemic Control Guidelines for People with Diabetes:Non Diabetic:  <6%Goal of Therapy: <7%Additional Action Suggested:  >8%      Allergies as of 01/23/2019   No Known Allergies     Medication List       Accurate as of January 23, 2019  3:09 PM. If you have any questions, ask your nurse or doctor.        amLODipine 10 MG tablet Commonly known as: NORVASC Take 10 mg by mouth daily.   atorvastatin 80 MG tablet Commonly known as: LIPITOR Take 80 mg by mouth  daily.   Caltrate 600+D Plus Minerals 600-800 MG-UNIT Tabs Take by mouth as needed.   fluconazole 150 MG tablet Commonly known as: DIFLUCAN Take 1 tablet (150 mg total) by mouth once for 1 dose. Started by: Elayne Snare, MD   glucose blood test strip Commonly known as: Contour Next Test Use as instructed   Jardiance 10 MG Tabs tablet Generic drug: empagliflozin TAKE ONE TABLET BY MOUTH ONE TIME DAILY   metFORMIN 500 MG 24 hr tablet Commonly known as: GLUCOPHAGE-XR Take 4 tablets (2,000 mg total) by mouth daily with supper. What changed: additional instructions   multivitamin-iron-minerals-folic acid chewable tablet Chew 1 tablet by mouth daily.   Ozempic (1 MG/DOSE) 2 MG/1.5ML Sopn Generic drug: Semaglutide (1 MG/DOSE) inject 1mg  subcutaneously once weekly   Tresiba FlexTouch 100 UNIT/ML Sopn FlexTouch Pen Generic drug: insulin degludec INJECT 22 UNITS INTO THE SKIN DAILY What changed: See the new instructions.   valsartan-hydrochlorothiazide 320-25 MG tablet Commonly known as: DIOVAN-HCT Take 1 tablet by mouth daily.       Allergies: No Known Allergies  Past Medical History:  Diagnosis Date  . Diabetes mellitus   . Hyperlipidemia   . Hypertension     Past Surgical History:  Procedure Laterality Date  . LAPAROSCOPIC GASTRIC BANDING  01/29/2007    History reviewed. No pertinent family history.  Social History:  reports that he has never smoked. He has never used smokeless tobacco. He reports current alcohol use of about 5.0 standard drinks of alcohol per week. He reports that he does not use drugs.   Review of Systems   Lipid history: Not on any statin drugs and LDL is below 100 Triglycerides as follows:    Lab Results  Component Value Date   CHOL 149 10/16/2018   HDL 53.10 10/16/2018   LDLCALC 63 10/16/2018   LDLDIRECT 71.0 06/20/2016   TRIG 161.0 (H) 10/16/2018   CHOLHDL 3 10/16/2018           Hypertension: Also followed by PCP  Is on  Diovan HCT and amlodipine with good control   BP Readings from Last 3 Encounters:  01/23/19 110/78  10/18/18 104/82  07/18/18 122/82    Most recent eye exam was in 12/18, has not scheduled follow-up  Most recent foot exam: 1/21 No history of numbness or tingling in the feet  LABS:  Lab on 01/20/2019  Component Date Value Ref Range Status  . Sodium 01/20/2019  138  135 - 145 mEq/L Final  . Potassium 01/20/2019 4.0  3.5 - 5.1 mEq/L Final  . Chloride 01/20/2019 98  96 - 112 mEq/L Final  . CO2 01/20/2019 33* 19 - 32 mEq/L Final  . Glucose, Bld 01/20/2019 202* 70 - 99 mg/dL Final  . BUN 13/08/6576 8  6 - 23 mg/dL Final  . Creatinine, Ser 01/20/2019 0.82  0.40 - 1.50 mg/dL Final  . GFR 46/96/2952 118.84  >60.00 mL/min Final  . Calcium 01/20/2019 9.5  8.4 - 10.5 mg/dL Final  . Hgb W4X MFr Bld 01/20/2019 8.5* 4.6 - 6.5 % Final   Glycemic Control Guidelines for People with Diabetes:Non Diabetic:  <6%Goal of Therapy: <7%Additional Action Suggested:  >8%      Physical Examination:  BP 110/78 (BP Location: Left Arm, Patient Position: Sitting, Cuff Size: Large)   Pulse (!) 114   Ht 6\' 4"  (1.93 m)   Wt 292 lb 3.2 oz (132.5 kg)   SpO2 96%   BMI 35.57 kg/m   Diabetic Foot Exam - Simple   Simple Foot Form Diabetic Foot exam was performed with the following findings: Yes   Visual Inspection No deformities, no ulcerations, no other skin breakdown bilaterally: Yes Sensation Testing Intact to touch and monofilament testing bilaterally: Yes Pulse Check Posterior Tibialis and Dorsalis pulse intact bilaterally: Yes See comments: Yes Comments Decreased pedal pulses on the right       ASSESSMENT:  Diabetes type 2, uncontrolled, with obesity  See history of present illness for detailed discussion of current diabetes management, blood sugar patterns and problems identified  His A1c is 8.5  He still has not had any improvement in his blood sugars He may have a falsely low reading  glucose meter at home since his fasting glucose was 202 in the office even though he thinks his blood sugars are never over 155 Although he has done some exercise and has brought his weight down 4 pounds he still is not consistent with diet and get exercise more  Also control may be difficult to achieve because of his not wanting to take Jardiance that is causing frequent balanitis  HYPERTENSION: Blood pressure well controlled  Foot exam does not show any signs of sensory loss or absent pulses  Morbid obesity: Lifestyle changes, use of Jardiance recommended    PLAN:   He will try to take Jardiance daily He will be given Diflucan to take at the start of any symptoms of balanitis and may repeat up to every 2 weeks or so if needed Increase fluid intake consistently Discussed that if he is able to take Jardiance will hopefully get his A1c down to 7% Also if he has high postprandial readings consistently may need to consider mealtime insulin  Increase exercise frequency Stay on 24 units of Tresiba Start using a new meter which was given today and verify accuracy of this More blood sugar testing after meals    Follow-up in 3 months for A1c     Patient Instructions  Check blood sugars on waking up 3 days a week  Also check blood sugars about 2 hours after meals and do this after different meals by rotation  Recommended blood sugar levels on waking up are 90-130 and about 2 hours after meal is 130-160  Please bring your blood sugar monitor to each visit, thank you     [id number] 01/23/2019, 3:09 PM   Note: This office note was prepared with Dragon voice recognition system technology.  Any transcriptional errors that result from this process are unintentional.

## 2019-01-24 LAB — NOVEL CORONAVIRUS, NAA: SARS-CoV-2, NAA: NOT DETECTED

## 2019-02-03 ENCOUNTER — Other Ambulatory Visit: Payer: Self-pay | Admitting: Endocrinology

## 2019-02-13 ENCOUNTER — Ambulatory Visit: Payer: 59 | Attending: Internal Medicine

## 2019-02-13 DIAGNOSIS — Z20822 Contact with and (suspected) exposure to covid-19: Secondary | ICD-10-CM

## 2019-02-14 LAB — NOVEL CORONAVIRUS, NAA: SARS-CoV-2, NAA: NOT DETECTED

## 2019-03-07 ENCOUNTER — Other Ambulatory Visit: Payer: Self-pay | Admitting: Endocrinology

## 2019-04-07 ENCOUNTER — Other Ambulatory Visit: Payer: Self-pay | Admitting: Endocrinology

## 2019-04-14 ENCOUNTER — Other Ambulatory Visit (INDEPENDENT_AMBULATORY_CARE_PROVIDER_SITE_OTHER): Payer: 59

## 2019-04-14 ENCOUNTER — Other Ambulatory Visit: Payer: Self-pay

## 2019-04-14 DIAGNOSIS — E1165 Type 2 diabetes mellitus with hyperglycemia: Secondary | ICD-10-CM | POA: Diagnosis not present

## 2019-04-14 DIAGNOSIS — Z794 Long term (current) use of insulin: Secondary | ICD-10-CM

## 2019-04-14 LAB — COMPREHENSIVE METABOLIC PANEL
ALT: 18 U/L (ref 0–53)
AST: 17 U/L (ref 0–37)
Albumin: 4.1 g/dL (ref 3.5–5.2)
Alkaline Phosphatase: 87 U/L (ref 39–117)
BUN: 13 mg/dL (ref 6–23)
CO2: 32 mEq/L (ref 19–32)
Calcium: 9.9 mg/dL (ref 8.4–10.5)
Chloride: 99 mEq/L (ref 96–112)
Creatinine, Ser: 0.99 mg/dL (ref 0.40–1.50)
GFR: 95.54 mL/min (ref >60.00)
Glucose, Bld: 142 mg/dL — ABNORMAL HIGH (ref 70–99)
Potassium: 4.2 mEq/L (ref 3.5–5.1)
Sodium: 138 mEq/L (ref 135–145)
Total Bilirubin: 0.5 mg/dL (ref 0.2–1.2)
Total Protein: 7.3 g/dL (ref 6.0–8.3)

## 2019-04-14 LAB — HEMOGLOBIN A1C: Hgb A1c MFr Bld: 7.7 % — ABNORMAL HIGH (ref 4.6–6.5)

## 2019-04-17 ENCOUNTER — Other Ambulatory Visit: Payer: Self-pay

## 2019-04-17 ENCOUNTER — Ambulatory Visit (INDEPENDENT_AMBULATORY_CARE_PROVIDER_SITE_OTHER): Payer: 59 | Admitting: Endocrinology

## 2019-04-17 ENCOUNTER — Encounter: Payer: Self-pay | Admitting: Endocrinology

## 2019-04-17 VITALS — BP 112/82 | HR 82 | Ht 76.0 in | Wt 290.2 lb

## 2019-04-17 DIAGNOSIS — I10 Essential (primary) hypertension: Secondary | ICD-10-CM

## 2019-04-17 DIAGNOSIS — E1165 Type 2 diabetes mellitus with hyperglycemia: Secondary | ICD-10-CM

## 2019-04-17 DIAGNOSIS — Z794 Long term (current) use of insulin: Secondary | ICD-10-CM | POA: Diagnosis not present

## 2019-04-17 MED ORDER — FLUCONAZOLE 150 MG PO TABS: 150.0000 mg | ORAL_TABLET | Freq: Once | ORAL | 1 refills | Status: AC

## 2019-04-17 NOTE — Patient Instructions (Signed)
Exercise daily ° °Check blood sugars on waking up 3 days a week ° °Also check blood sugars about 2 hours after meals and do this after different meals by rotation ° °Recommended blood sugar levels on waking up are 90-130 and about 2 hours after meal is 130-160 ° °Please bring your blood sugar monitor to each visit, thank you ° °

## 2019-04-17 NOTE — Progress Notes (Signed)
Reason for Appointment: Follow-up for Type 2 Diabetes  Referring physician: Ehinger   History of Present Illness:          Date of diagnosis of type 2 diabetes mellitus:?  1997        Background history:   He was symptomatic at diagnosis with increased urination He has been mostly treated with metformin and glipizide in the past He was taking Januvia more recently but this was changed in 2017 2 Onglyza because of insurance preference He does not think his sugars have been consistently controlled in the past, best A1c recently appears to be in 05/2014 which was 7.5 but subsequently has been at least 8%  Recent history:   TRESIBA insulin: 24 units daily  Non-insulin hypoglycemic drugs: Metformin ER 500, 4 tablets in a.m., Jardiance 10 mg daily, Ozempic 1 mg weekly  His A1c has been usually over 8 % but now 7.7 Last visit 1/21   Current management, blood sugar patterns and problems identified:  He has finally brought his meter for download but has done only a few readings and mostly in the mornings  Highest blood sugar was 171 after lunch but most of his fasting readings are relatively good  He has 2 different meters and he likes to compare the 2 at the same time  His lab glucose was 142 fasting  He says he is trying to use Jardiance again, previously had difficulties with balanitis and this is relieved by Diflucan  However has not done any exercise  Has not lost any weight  Not clear if he has any high readings after meals as he has only 1 reading in the early afternoon over the last month  He has been regular with [place] and weekly Ozempic doses; likes to take his Metformin 2 g together in the morning        Side effects from medications have been: none  Compliance with the medical regimen: Inadequate Hypoglycemia:   never 104-171 Glucose monitoring: Using contour meter, blood sugars as below   PRE-MEAL Fasting Lunch Dinner Bedtime Overall  Glucose  range:  125-155     104-171  Mean/median:  137     139   POST-MEAL PC Breakfast PC Lunch PC Dinner  Glucose range:   171   Mean/median:        Self-care: The diet that the patient has been following is: tries to limit fast food usually .     Meal times are:  Breakfast is at 9 AM-12 pm Lunch: 2-4 PM Dinner:  6-8 PM              Dietician visit, most recent: 9/17  Weight history:   He had a gastric band in 2009 but this was loosened a couple of years ago because with taking metformin he would get uncomfortably bloated and has gained weight  Wt Readings from Last 3 Encounters:  04/17/19 290 lb 3.2 oz (131.6 kg)  01/23/19 292 lb 3.2 oz (132.5 kg)  10/18/18 296 lb 12.8 oz (134.6 kg)    Glycemic control:    Lab Results  Component Value Date   HGBA1C 7.7 (H) 04/14/2019   HGBA1C 8.5 (H) 01/20/2019   HGBA1C 8.9 (H) 10/16/2018   Lab Results  Component Value Date   MICROALBUR <0.7 10/16/2018   LDLCALC 63 10/16/2018   CREATININE 0.99 04/14/2019   Lab Results  Component Value Date   MICRALBCREAT 0.5 10/16/2018  Lab on 04/14/2019  Component Date Value Ref Range Status  . Sodium 04/14/2019 138  135 - 145 mEq/L Final  . Potassium 04/14/2019 4.2  3.5 - 5.1 mEq/L Final  . Chloride 04/14/2019 99  96 - 112 mEq/L Final  . CO2 04/14/2019 32  19 - 32 mEq/L Final  . Glucose, Bld 04/14/2019 142* 70 - 99 mg/dL Final  . BUN 25/85/2778 13  6 - 23 mg/dL Final  . Creatinine, Ser 04/14/2019 0.99  0.40 - 1.50 mg/dL Final  . Total Bilirubin 04/14/2019 0.5  0.2 - 1.2 mg/dL Final  . Alkaline Phosphatase 04/14/2019 87  39 - 117 U/L Final  . AST 04/14/2019 17  0 - 37 U/L Final  . ALT 04/14/2019 18  0 - 53 U/L Final  . Total Protein 04/14/2019 7.3  6.0 - 8.3 g/dL Final  . Albumin 24/23/5361 4.1  3.5 - 5.2 g/dL Final  . GFR 44/31/5400 95.54  >60.00 mL/min Final  . Calcium 04/14/2019 9.9  8.4 - 10.5 mg/dL Final  . Hgb Q6P MFr Bld 04/14/2019 7.7* 4.6 - 6.5 % Final   Glycemic Control Guidelines  for People with Diabetes:Non Diabetic:  <6%Goal of Therapy: <7%Additional Action Suggested:  >8%      Allergies as of 04/17/2019   No Known Allergies     Medication List       Accurate as of April 17, 2019  1:36 PM. If you have any questions, ask your nurse or doctor.        amLODipine 10 MG tablet Commonly known as: NORVASC Take 10 mg by mouth daily.   atorvastatin 80 MG tablet Commonly known as: LIPITOR Take 80 mg by mouth daily.   Caltrate 600+D Plus Minerals 600-800 MG-UNIT Tabs Take by mouth as needed.   glucose blood test strip Commonly known as: Contour Next Test Use as instructed   Jardiance 10 MG Tabs tablet Generic drug: empagliflozin TAKE ONE TABLET BY MOUTH ONE TIME DAILY   metFORMIN 500 MG 24 hr tablet Commonly known as: GLUCOPHAGE-XR Take 4 tablets (2,000 mg total) by mouth daily with supper. What changed: additional instructions   multivitamin-iron-minerals-folic acid chewable tablet Chew 1 tablet by mouth daily.   Ozempic (1 MG/DOSE) 2 MG/1.5ML Sopn Generic drug: Semaglutide (1 MG/DOSE) inject 1mg  subcutaneously once weekly   Tresiba FlexTouch 100 UNIT/ML FlexTouch Pen Generic drug: insulin degludec Inject 0.24 mLs (24 Units total) into the skin daily.   valsartan-hydrochlorothiazide 320-25 MG tablet Commonly known as: DIOVAN-HCT Take 1 tablet by mouth daily.       Allergies: No Known Allergies  Past Medical History:  Diagnosis Date  . Diabetes mellitus   . Hyperlipidemia   . Hypertension     Past Surgical History:  Procedure Laterality Date  . LAPAROSCOPIC GASTRIC BANDING  01/29/2007    History reviewed. No pertinent family history.  Social History:  reports that he has never smoked. He has never used smokeless tobacco. He reports current alcohol use of about 5.0 standard drinks of alcohol per week. He reports that he does not use drugs.   Review of Systems   Lipid history:  LDL is below 100 Not clear if he is taking  atorvastatin from his PCP Triglycerides mildly increased as follows:    Lab Results  Component Value Date   CHOL 149 10/16/2018   HDL 53.10 10/16/2018   LDLCALC 63 10/16/2018   LDLDIRECT 71.0 06/20/2016   TRIG 161.0 (H) 10/16/2018   CHOLHDL 3 10/16/2018  Hypertension: This is followed by PCP  Is on Diovan HCT and amlodipine with good control   BP Readings from Last 3 Encounters:  04/17/19 112/82  01/23/19 110/78  10/18/18 104/82    Most recent eye exam was in 12/19, has not scheduled follow-up  Most recent foot exam: 1/21   LABS:  Lab on 04/14/2019  Component Date Value Ref Range Status  . Sodium 04/14/2019 138  135 - 145 mEq/L Final  . Potassium 04/14/2019 4.2  3.5 - 5.1 mEq/L Final  . Chloride 04/14/2019 99  96 - 112 mEq/L Final  . CO2 04/14/2019 32  19 - 32 mEq/L Final  . Glucose, Bld 04/14/2019 142* 70 - 99 mg/dL Final  . BUN 35/32/9924 13  6 - 23 mg/dL Final  . Creatinine, Ser 04/14/2019 0.99  0.40 - 1.50 mg/dL Final  . Total Bilirubin 04/14/2019 0.5  0.2 - 1.2 mg/dL Final  . Alkaline Phosphatase 04/14/2019 87  39 - 117 U/L Final  . AST 04/14/2019 17  0 - 37 U/L Final  . ALT 04/14/2019 18  0 - 53 U/L Final  . Total Protein 04/14/2019 7.3  6.0 - 8.3 g/dL Final  . Albumin 26/83/4196 4.1  3.5 - 5.2 g/dL Final  . GFR 22/29/7989 95.54  >60.00 mL/min Final  . Calcium 04/14/2019 9.9  8.4 - 10.5 mg/dL Final  . Hgb Q1J MFr Bld 04/14/2019 7.7* 4.6 - 6.5 % Final   Glycemic Control Guidelines for People with Diabetes:Non Diabetic:  <6%Goal of Therapy: <7%Additional Action Suggested:  >8%      Physical Examination:  BP 112/82 (BP Location: Left Arm, Patient Position: Sitting, Cuff Size: Normal)   Pulse 82   Ht 6\' 4"  (1.93 m)   Wt 290 lb 3.2 oz (131.6 kg)   SpO2 97%   BMI 35.32 kg/m      ASSESSMENT:  Diabetes type 2, uncontrolled, with obesity  See history of present illness for detailed discussion of current diabetes management, blood sugar  patterns and problems identified  His A1c is 7.7, previously 8.5  He appears to have taken Jardiance a little more regularly along with his regimen of Ozempic 1 mg and Tresiba He can tolerate this better with occasional episodes of balanitis  With his new glucose meter at home he appears to have similar readings as before However checking blood sugar very sporadically and mostly in the mornings He is overall still not very motivated to work on his weight loss especially with exercise Discussed that since his fasting readings are averaging about 135 and his A1c is 7.7 he likely has postprandial high blood sugars   HYPERTENSION: Blood pressure well controlled  Lipids: LDL below 100   Covid vaccine: He is very reluctant to take this and discussed that this is safe and very important for him to take it because of his risk factors and increasing incidence of infections  PLAN:   It was emphasized that he needs to exercise and either walk regularly or other Exercises  Discussed that if he has high postprandial readings consistently may need to consider mealtime insulin, he does not want to increase dose of Jardiance because of balanitis  No change in medications including Tresiba Diflucan prescription sent and may refill once if needed Discussed increasing fluid intake and local hygiene  Recommended that he make an appointment for the Covid vaccine and let Gregory Brady know if he has any further questions  Follow-up in 3 months with A1c  There are no Patient Instructions on file for this visit.  Reather Littler 04/17/2019, 1:36 PM   Note: This office note was prepared with Dragon voice recognition system technology. Any transcriptional errors that result from this process are unintentional.

## 2019-05-14 ENCOUNTER — Other Ambulatory Visit: Payer: Self-pay | Admitting: Endocrinology

## 2019-06-18 ENCOUNTER — Other Ambulatory Visit: Payer: Self-pay | Admitting: Endocrinology

## 2019-07-10 ENCOUNTER — Other Ambulatory Visit: Payer: Self-pay

## 2019-07-10 ENCOUNTER — Other Ambulatory Visit (INDEPENDENT_AMBULATORY_CARE_PROVIDER_SITE_OTHER): Payer: 59

## 2019-07-10 DIAGNOSIS — E1165 Type 2 diabetes mellitus with hyperglycemia: Secondary | ICD-10-CM

## 2019-07-10 DIAGNOSIS — Z794 Long term (current) use of insulin: Secondary | ICD-10-CM

## 2019-07-10 LAB — URINALYSIS, ROUTINE W REFLEX MICROSCOPIC
Bilirubin Urine: NEGATIVE
Hgb urine dipstick: NEGATIVE
Leukocytes,Ua: NEGATIVE
Nitrite: NEGATIVE
RBC / HPF: NONE SEEN (ref 0–?)
Specific Gravity, Urine: >=1.03 — AB (ref 1.000–1.030)
Total Protein, Urine: NEGATIVE
Urine Glucose: >=1000 — AB
Urobilinogen, UA: 1 (ref 0.0–1.0)
pH: 5 (ref 5.0–8.0)

## 2019-07-10 LAB — LIPID PANEL
Cholesterol: 164 mg/dL (ref 0–200)
HDL: 37.8 mg/dL — ABNORMAL LOW (ref >39.00)
Total CHOL/HDL Ratio: 4
Triglycerides: 405 mg/dL — ABNORMAL HIGH (ref 0.0–149.0)

## 2019-07-10 LAB — HEMOGLOBIN A1C: Hgb A1c MFr Bld: 8.7 % — ABNORMAL HIGH (ref 4.6–6.5)

## 2019-07-10 LAB — MICROALBUMIN / CREATININE URINE RATIO
Creatinine,U: 263.9 mg/dL
Microalb Creat Ratio: 0.7 mg/g (ref 0.0–30.0)
Microalb, Ur: 1.8 mg/dL (ref 0.0–1.9)

## 2019-07-10 LAB — LDL CHOLESTEROL, DIRECT: Direct LDL: 78 mg/dL

## 2019-07-11 LAB — COMPREHENSIVE METABOLIC PANEL
ALT: 15 U/L (ref 0–53)
AST: 17 U/L (ref 0–37)
Albumin: 4.5 g/dL (ref 3.5–5.2)
Alkaline Phosphatase: 101 U/L (ref 39–117)
BUN: 17 mg/dL (ref 6–23)
CO2: 31 mEq/L (ref 19–32)
Calcium: 10 mg/dL (ref 8.4–10.5)
Chloride: 103 mEq/L (ref 96–112)
Creatinine, Ser: 1.12 mg/dL (ref 0.40–1.50)
GFR: 82.78 mL/min (ref >60.00)
Glucose, Bld: 121 mg/dL — ABNORMAL HIGH (ref 70–99)
Potassium: 4 mEq/L (ref 3.5–5.1)
Sodium: 138 mEq/L (ref 135–145)
Total Bilirubin: 0.7 mg/dL (ref 0.2–1.2)
Total Protein: 7.8 g/dL (ref 6.0–8.3)

## 2019-07-17 ENCOUNTER — Other Ambulatory Visit: Payer: Self-pay

## 2019-07-17 ENCOUNTER — Ambulatory Visit (INDEPENDENT_AMBULATORY_CARE_PROVIDER_SITE_OTHER): Payer: 59 | Admitting: Endocrinology

## 2019-07-17 ENCOUNTER — Encounter: Payer: Self-pay | Admitting: Endocrinology

## 2019-07-17 VITALS — BP 110/68 | HR 82 | Ht 76.0 in | Wt 285.6 lb

## 2019-07-17 DIAGNOSIS — E1165 Type 2 diabetes mellitus with hyperglycemia: Secondary | ICD-10-CM

## 2019-07-17 DIAGNOSIS — Z794 Long term (current) use of insulin: Secondary | ICD-10-CM | POA: Diagnosis not present

## 2019-07-17 DIAGNOSIS — E782 Mixed hyperlipidemia: Secondary | ICD-10-CM

## 2019-07-17 MED ORDER — OZEMPIC (1 MG/DOSE) 2 MG/1.5ML ~~LOC~~ SOPN: PEN_INJECTOR | SUBCUTANEOUS | 3 refills | Status: DC

## 2019-07-17 MED ORDER — FLUCONAZOLE 150 MG PO TABS
150.0000 mg | ORAL_TABLET | Freq: Once | ORAL | 3 refills | Status: AC
Start: 2019-07-17 — End: 2019-07-17

## 2019-07-17 NOTE — Patient Instructions (Signed)
Check blood sugars on 3-4 waking up days a week  Also check blood sugars about 2 hours after meals and do this after different meals by rotation  Recommended blood sugar levels on waking up are 90-130 and about 2 hours after meal is 130-180  Please bring your blood sugar monitor to each visit, thank you  Keep track of food intake, less carbs

## 2019-07-17 NOTE — Progress Notes (Signed)
Reason for Appointment: Follow-up for Type 2 Diabetes  Referring physician: Ehinger   History of Present Illness:          Date of diagnosis of type 2 diabetes mellitus:?  1997        Background history:   He was symptomatic at diagnosis with increased urination He has been mostly treated with metformin and glipizide in the past He was taking Januvia more recently but this was changed in 2017 2 Onglyza because of insurance preference He does not think his sugars have been consistently controlled in the past, best A1c recently appears to be in 05/2014 which was 7.5 but subsequently has been at least 8%  Recent history:   TRESIBA insulin: 24 units daily  Non-insulin hypoglycemic drugs: Metformin ER 500, 4 tablets in a.m., Jardiance 10 mg , Ozempic 1 mg weekly  His A1c has gone back up to 8.7 from 7.7  Current management, blood sugar patterns and problems identified:  He has higher blood sugars despite his weight coming down 5 pounds  He says he is eating a lot of snacks like cookies at night  Also eating mostly cereal in the morning and blood sugar was 206 after eating this today  Although 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  Has not had a recent prescription for Diflucan  He has been regular with [place] and weekly Ozempic doses; prefers to take his Metformin 2 g together in the morning        Side effects from medications have been: none  Compliance with the medical regimen: Inadequate Hypoglycemia:   None  Glucose monitoring:  Using contour meter, blood sugars as below  Has 8 readings in the last month  PRE-MEAL Fasting Lunch Dinner Bedtime Overall  Glucose range:  147, 162  122  110, 122    Mean/median:      158   POST-MEAL PC Breakfast PC Lunch PC Dinner  Glucose range: 195, 206  202   Mean/median:        Previous readings:  PRE-MEAL Fasting Lunch Dinner Bedtime Overall  Glucose range:  125-155     104-171  Mean/median:  137     139   POST-MEAL PC Breakfast PC Lunch PC Dinner  Glucose range:   171   Mean/median:        Self-care: The diet that the patient has been following is: tries to limit fast food usually .     Meal times are:  Breakfast is at 9 AM-12 pm Lunch: 2-4 PM Dinner:  6-8 PM              Dietician visit, most recent: 9/17  Weight history:   He had a gastric band in 2009 but this was loosened a couple of years ago because with taking metformin he would get uncomfortably bloated and has gained weight  Wt Readings from Last 3 Encounters:  07/17/19 285 lb 9.6 oz (129.5 kg)  04/17/19 290 lb 3.2 oz (131.6 kg)  01/23/19 292 lb 3.2 oz (132.5 kg)    Glycemic control:    Lab Results  Component Value Date   HGBA1C 8.7 (H) 07/10/2019   HGBA1C 7.7 (H) 04/14/2019   HGBA1C 8.5 (H) 01/20/2019   Lab Results  Component Value Date   MICROALBUR 1.8 07/10/2019   LDLCALC 63 10/16/2018   CREATININE 1.12 07/10/2019   Lab Results  Component Value Date   MICRALBCREAT 0.7 07/10/2019    No visits with results within 1 Week(s) from this visit.  Latest known visit with results is:  Lab on 07/10/2019  Component Date Value Ref Range Status  . Cholesterol 07/10/2019 164  0 - 200 mg/dL Final   ATP III Classification       Desirable:  < 200 mg/dL               Borderline High:  200 - 239 mg/dL          High:  > = 829 mg/dL  . Triglycerides 07/10/2019 405.0 Triglyceride is over 400; calculations on Lipids are invalid.* 0 - 149 mg/dL Final   Normal:  <937 mg/dLBorderline High:  150 - 199 mg/dL    . HDL 07/10/2019 37.80* >39.00 mg/dL Final  . Total CHOL/HDL Ratio 07/10/2019 4   Final                  Men          Women1/2 Average Risk     3.4          3.3Average Risk          5.0          4.42X Average Risk          9.6          7.13X Average Risk          15.0          11.0                      . Color, Urine 07/10/2019 YELLOW  Yellow;Lt. Yellow;Straw;Dark Yellow;Amber;Green;Red;Brown Final  . APPearance 07/10/2019 CLEAR  Clear;Turbid;Slightly Cloudy;Cloudy Final  . Specific Gravity, Urine 07/10/2019 >=1.030* 1.000 - 1.030 Final  . pH 07/10/2019 5.0  5.0 - 8.0 Final  . Total Protein, Urine 07/10/2019 NEGATIVE  Negative Final  . Urine Glucose 07/10/2019 >=1000* Negative Final  . Ketones, ur 07/10/2019 TRACE* Negative Final  . Bilirubin Urine 07/10/2019 NEGATIVE  Negative Final  . Hgb urine dipstick 07/10/2019 NEGATIVE  Negative Final  . Urobilinogen, UA 07/10/2019 1.0  0.0 - 1.0 Final  . Leukocytes,Ua 07/10/2019 NEGATIVE  Negative Final  . Nitrite 07/10/2019 NEGATIVE  Negative Final  . WBC, UA 07/10/2019 0-2/hpf  0-2/hpf Final  . RBC / HPF 07/10/2019 none seen  0-2/hpf Final  . Mucus, UA 07/10/2019 Presence of* None Final  . Squamous Epithelial / LPF 07/10/2019 Rare(0-4/hpf)  Rare(0-4/hpf) Final  . Microalb, Ur 07/10/2019 1.8  0.0 - 1.9 mg/dL Final  . Creatinine,U 16/96/7893 263.9  mg/dL Final  . Microalb Creat Ratio 07/10/2019 0.7  0.0 - 30.0 mg/g Final  . Sodium 07/10/2019 138  135 - 145 mEq/L Final  . Potassium 07/10/2019 4.0  3.5 - 5.1 mEq/L Final  . Chloride 07/10/2019 103  96 - 112 mEq/L Final  . CO2 07/10/2019 31  19 - 32 mEq/L Final  . Glucose, Bld 07/10/2019 121* 70 - 99 mg/dL Final  .  BUN 07/10/2019 17  6 - 23 mg/dL Final  . Creatinine, Ser 07/10/2019 1.12  0.40 - 1.50 mg/dL Final  . Total Bilirubin 07/10/2019 0.7  0.2 - 1.2 mg/dL Final  . Alkaline Phosphatase 07/10/2019 101  39 - 117 U/L Final  . AST 07/10/2019 17  0 - 37 U/L Final  . ALT 07/10/2019 15  0 - 53 U/L  Final  . Total Protein 07/10/2019 7.8  6.0 - 8.3 g/dL Final  . Albumin 32/20/2542 4.5  3.5 - 5.2 g/dL Final  . GFR 70/62/3762 82.78  >60.00 mL/min Final  . Calcium 07/10/2019 10.0  8.4 - 10.5 mg/dL Final  . Hgb G3T MFr Bld 07/10/2019 8.7* 4.6 - 6.5 % Final   Glycemic Control Guidelines for People with Diabetes:Non Diabetic:  <6%Goal of Therapy: <7%Additional Action Suggested:  >8%   . Direct LDL 07/10/2019 78.0  mg/dL Final   Optimal:  <517 mg/dLNear or Above Optimal:  100-129 mg/dLBorderline High:  130-159 mg/dLHigh:  160-189 mg/dLVery High:  >190 mg/dL     Allergies as of 06/17/6071   No Known Allergies     Medication List       Accurate as of July 17, 2019  1:41 PM. If you have any questions, ask your nurse or doctor.        amLODipine 10 MG tablet Commonly known as: NORVASC Take 10 mg by mouth daily.   atorvastatin 80 MG tablet Commonly known as: LIPITOR Take 80 mg by mouth daily.   Caltrate 600+D Plus Minerals 600-800 MG-UNIT Tabs Take by mouth as needed.   glucose blood test strip Commonly known as: Contour Next Test Use as instructed   Jardiance 10 MG Tabs tablet Generic drug: empagliflozin TAKE ONE TABLET BY MOUTH ONE TIME DAILY   metFORMIN 500 MG 24 hr tablet Commonly known as: GLUCOPHAGE-XR Take 4 tablets (2,000 mg total) by mouth daily with supper. What changed: additional instructions   multivitamin-iron-minerals-folic acid chewable tablet Chew 1 tablet by mouth daily.   Ozempic (1 MG/DOSE) 2 MG/1.5ML Sopn Generic drug: Semaglutide (1 MG/DOSE) inject 1mg  subcutaneously once weekly   Tresiba FlexTouch 100 UNIT/ML FlexTouch Pen Generic drug: insulin degludec Inject 0.24 mLs (24 Units total) into the skin daily.   valsartan-hydrochlorothiazide 320-25 MG tablet Commonly known as: DIOVAN-HCT Take 1 tablet by mouth daily.       Allergies: No Known Allergies  Past Medical History:  Diagnosis Date  . Diabetes mellitus   . Hyperlipidemia   .  Hypertension     Past Surgical History:  Procedure Laterality Date  . LAPAROSCOPIC GASTRIC BANDING  01/29/2007    No family history on file.  Social History:  reports that he has never smoked. He has never used smokeless tobacco. He reports current alcohol use of about 5.0 standard drinks of alcohol per week. He reports that he does not use drugs.   Review of Systems   Lipid history:  LDL is below 100 Not clear if he is taking atorvastatin from his PCP Triglycerides mildly increased as follows:    Lab Results  Component Value Date   CHOL 164 07/10/2019   HDL 37.80 (L) 07/10/2019   LDLCALC 63 10/16/2018   LDLDIRECT 78.0 07/10/2019   TRIG (H) 07/10/2019    405.0 Triglyceride is over 400; calculations on Lipids are invalid.   CHOLHDL 4 07/10/2019           Hypertension: This is followed by PCP  Is on Diovan HCT and amlodipine with good  control   BP Readings from Last 3 Encounters:  07/17/19 110/68  04/17/19 112/82  01/23/19 110/78    Most recent eye exam was in 12/19, has not scheduled follow-up  Most recent foot exam: 1/21   LABS:  No visits with results within 1 Week(s) from this visit.  Latest known visit with results is:  Lab on 07/10/2019  Component Date Value Ref Range Status  . Cholesterol 07/10/2019 164  0 - 200 mg/dL Final   ATP III Classification       Desirable:  < 200 mg/dL               Borderline High:  200 - 239 mg/dL          High:  > = 244240 mg/dL  . Triglycerides 07/10/2019 405.0 Triglyceride is over 400; calculations on Lipids are invalid.* 0 - 149 mg/dL Final   Normal:  <010<150 mg/dLBorderline High:  150 - 199 mg/dL  . HDL 07/10/2019 37.80* >39.00 mg/dL Final  . Total CHOL/HDL Ratio 07/10/2019 4   Final                  Men          Women1/2 Average Risk     3.4          3.3Average Risk          5.0          4.42X Average Risk          9.6          7.13X Average Risk          15.0          11.0                      . Color, Urine 07/10/2019  YELLOW  Yellow;Lt. Yellow;Straw;Dark Yellow;Amber;Green;Red;Brown Final  . APPearance 07/10/2019 CLEAR  Clear;Turbid;Slightly Cloudy;Cloudy Final  . Specific Gravity, Urine 07/10/2019 >=1.030* 1.000 - 1.030 Final  . pH 07/10/2019 5.0  5.0 - 8.0 Final  . Total Protein, Urine 07/10/2019 NEGATIVE  Negative Final  . Urine Glucose 07/10/2019 >=1000* Negative Final  . Ketones, ur 07/10/2019 TRACE* Negative Final  . Bilirubin Urine 07/10/2019 NEGATIVE  Negative Final  . Hgb urine dipstick 07/10/2019 NEGATIVE  Negative Final  . Urobilinogen, UA 07/10/2019 1.0  0.0 - 1.0 Final  . Leukocytes,Ua 07/10/2019 NEGATIVE  Negative Final  . Nitrite 07/10/2019 NEGATIVE  Negative Final  . WBC, UA 07/10/2019 0-2/hpf  0-2/hpf Final  . RBC / HPF 07/10/2019 none seen  0-2/hpf Final  . Mucus, UA 07/10/2019 Presence of* None Final  . Squamous Epithelial / LPF 07/10/2019 Rare(0-4/hpf)  Rare(0-4/hpf) Final  . Microalb, Ur 07/10/2019 1.8  0.0 - 1.9 mg/dL Final  . Creatinine,U 27/25/366406/24/2021 263.9  mg/dL Final  . Microalb Creat Ratio 07/10/2019 0.7  0.0 - 30.0 mg/g Final  . Sodium 07/10/2019 138  135 - 145 mEq/L Final  . Potassium 07/10/2019 4.0  3.5 - 5.1 mEq/L Final  . Chloride 07/10/2019 103  96 - 112 mEq/L Final  . CO2 07/10/2019 31  19 - 32 mEq/L Final  . Glucose, Bld 07/10/2019 121* 70 - 99 mg/dL Final  . BUN 40/34/742506/24/2021 17  6 - 23 mg/dL Final  . Creatinine, Ser 07/10/2019 1.12  0.40 - 1.50 mg/dL Final  . Total Bilirubin 07/10/2019 0.7  0.2 - 1.2 mg/dL Final  . Alkaline Phosphatase 07/10/2019 101  39 - 117 U/L  Final  . AST 07/10/2019 17  0 - 37 U/L Final  . ALT 07/10/2019 15  0 - 53 U/L Final  . Total Protein 07/10/2019 7.8  6.0 - 8.3 g/dL Final  . Albumin 16/10/9602 4.5  3.5 - 5.2 g/dL Final  . GFR 54/09/8117 82.78  >60.00 mL/min Final  . Calcium 07/10/2019 10.0  8.4 - 10.5 mg/dL Final  . Hgb J4N MFr Bld 07/10/2019 8.7* 4.6 - 6.5 % Final   Glycemic Control Guidelines for People with Diabetes:Non Diabetic:   <6%Goal of Therapy: <7%Additional Action Suggested:  >8%   . Direct LDL 07/10/2019 78.0  mg/dL Final   Optimal:  <829 mg/dLNear or Above Optimal:  100-129 mg/dLBorderline High:  130-159 mg/dLHigh:  160-189 mg/dLVery High:  >190 mg/dL     Physical Examination:  BP 110/68 (BP Location: Left Arm)   Pulse 82   Ht  (1.93 m)   Wt 285 lb 9.6 oz (129.5 kg)   SpO2 94%   BMI 34.76 kg/m      ASSESSMENT:  Diabetes type 2, uncontrolled, with obesity  See history of present illness for detailed discussion of current diabetes management, blood sugar patterns and problems identified  His A1c is 8.7  Blood sugars are poorly controlled related to not watching his diet with more carbohydrates and snacks Also appears to be more insulin deficient with higher A1c Despite reminders he does not start his exercise regimen Also probably has high fasting readings from late night snacks and sweets   HYPERTENSION: Blood pressure well controlled  Lipids: LDL below 100 but is having a higher triglyceride level    PLAN:   Today discussed the possible need for mealtime insulin to cover postprandial spikes, action of mealtime insulin especially at his main meal However also given the option of using the V-go pump to avoid multiple injection He is not wanting to do either 1 of these options He thinks that he can improve his sugars with exercise regimen and wants to start doing exercise daily at the gym Also recommended that he check his sugars much more regularly including some after dinner We will try to avoid cereal on a daily basis and given him options for various breakfast foods, handout on this given Also given handouts on healthy snacks and eating out Currently refusing to see the dietitian  He can take Jardiance again and use Diflucan as needed  Diflucan prescription sent and may refill 3 times if needed  Consider fenofibrate but he refuses to add another medication for his  lipids  Follow-up in 2 months again     There are no Patient Instructions on file for this visit.  Reather Littler 07/17/2019, 1:41 PM   Note: This office note was prepared with Dragon voice recognition system technology. Any transcriptional errors that result from this process are unintentional.

## 2019-07-21 ENCOUNTER — Other Ambulatory Visit: Payer: Self-pay | Admitting: Endocrinology

## 2019-09-05 ENCOUNTER — Other Ambulatory Visit: Payer: Self-pay | Admitting: Endocrinology

## 2019-09-12 ENCOUNTER — Other Ambulatory Visit: Payer: Self-pay

## 2019-09-12 ENCOUNTER — Other Ambulatory Visit (INDEPENDENT_AMBULATORY_CARE_PROVIDER_SITE_OTHER): Payer: 59

## 2019-09-12 DIAGNOSIS — E782 Mixed hyperlipidemia: Secondary | ICD-10-CM

## 2019-09-12 DIAGNOSIS — E1165 Type 2 diabetes mellitus with hyperglycemia: Secondary | ICD-10-CM

## 2019-09-12 DIAGNOSIS — Z794 Long term (current) use of insulin: Secondary | ICD-10-CM

## 2019-09-12 LAB — BASIC METABOLIC PANEL
BUN: 11 mg/dL (ref 6–23)
CO2: 33 mEq/L — ABNORMAL HIGH (ref 19–32)
Calcium: 9.3 mg/dL (ref 8.4–10.5)
Chloride: 98 mEq/L (ref 96–112)
Creatinine, Ser: 1.04 mg/dL (ref 0.40–1.50)
GFR: 90.11 mL/min (ref >60.00)
Glucose, Bld: 139 mg/dL — ABNORMAL HIGH (ref 70–99)
Potassium: 4 mEq/L (ref 3.5–5.1)
Sodium: 139 mEq/L (ref 135–145)

## 2019-09-12 LAB — LIPID PANEL
Cholesterol: 135 mg/dL (ref 0–200)
HDL: 48.4 mg/dL (ref >39.00)
LDL Cholesterol: 70 mg/dL (ref 0–99)
NonHDL: 86.35
Total CHOL/HDL Ratio: 3
Triglycerides: 82 mg/dL (ref 0.0–149.0)
VLDL: 16.4 mg/dL (ref 0.0–40.0)

## 2019-09-12 LAB — HEMOGLOBIN A1C: Hgb A1c MFr Bld: 7.8 % — ABNORMAL HIGH (ref 4.6–6.5)

## 2019-09-13 LAB — FRUCTOSAMINE: Fructosamine: 281 umol/L (ref 0–285)

## 2019-09-18 ENCOUNTER — Encounter: Payer: Self-pay | Admitting: Endocrinology

## 2019-09-18 ENCOUNTER — Other Ambulatory Visit: Payer: Self-pay

## 2019-09-18 ENCOUNTER — Ambulatory Visit (INDEPENDENT_AMBULATORY_CARE_PROVIDER_SITE_OTHER): Payer: 59 | Admitting: Endocrinology

## 2019-09-18 VITALS — BP 122/86 | HR 82 | Ht 76.0 in | Wt 281.6 lb

## 2019-09-18 DIAGNOSIS — Z794 Long term (current) use of insulin: Secondary | ICD-10-CM | POA: Diagnosis not present

## 2019-09-18 DIAGNOSIS — E1165 Type 2 diabetes mellitus with hyperglycemia: Secondary | ICD-10-CM | POA: Diagnosis not present

## 2019-09-18 NOTE — Progress Notes (Signed)
Reason for Appointment: Follow-up for Type 2 Diabetes  Referring physician: Ehinger   History of Present Illness:          Date of diagnosis of type 2 diabetes mellitus:?  1997        Background history:   He was symptomatic at diagnosis with increased urination He has been mostly treated with metformin and glipizide in the past He was taking Januvia more recently but this was changed in 2017 2 Onglyza because of insurance preference He does not think his sugars have been consistently controlled in the past, best A1c recently appears to be in 05/2014 which was 7.5 but subsequently has been at least 8%  Recent history:   TRESIBA insulin: 24 units daily  Non-insulin hypoglycemic drugs: Metformin ER 500, 4 tablets in a.m., Jardiance 10 mg , Ozempic 1 mg weekly  His A1c previously was up to 8.7 from 7.7 and is now back to 7.8 Fructosamine is 281  Current management, blood sugar patterns and problems identified:  He has been trying to take his JARDIANCE more regularly now to help with his control  He is taking Diflucan as needed for balanitis and he thinks this is helping  However not clear if his postprandial readings are controlled as a result only a few readings in the morning  His fasting readings are better compared to his last visit  He was told to try and cut back on his snacks like cookies and he is doing a little better with this Eating less cereal in the morning  However only occasionally is going to the gym even though he was reminded about increasing exercise  Taking Ozempic regularly        Side effects from medications have been: none  Compliance with the medical regimen: Inadequate Hypoglycemia:   None  Glucose monitoring: Using contour meter  Has 7 readings in the last month FASTING range 107-147 with AVERAGE 125  Bedtime 154  PRE-MEAL Fasting Lunch Dinner Bedtime Overall  Glucose range:  147, 162  122  110, 122    Mean/median:      158    POST-MEAL PC Breakfast PC Lunch PC Dinner  Glucose range: 195, 206  202   Mean/median:         Self-care: The diet that the patient has been following is: tries to limit fast food usually .     Meal times are:  Breakfast is at 9 AM-12 pm Lunch: 2-4 PM Dinner:  6-8 PM              Dietician visit, most recent: 9/17  Weight history:   He had a gastric band in 2009 but this was loosened a couple of years ago because with taking metformin he would get uncomfortably bloated and has gained weight  Wt Readings from Last 3 Encounters:  09/18/19 281 lb 9.6 oz (127.7 kg)  07/17/19 285 lb 9.6 oz (129.5 kg)  04/17/19 290 lb 3.2 oz (131.6 kg)    Glycemic control:    Lab Results  Component Value Date   HGBA1C 7.8 (H) 09/12/2019   HGBA1C 8.7 (H) 07/10/2019   HGBA1C 7.7 (H) 04/14/2019   Lab Results  Component Value Date   MICROALBUR 1.8 07/10/2019   LDLCALC 70 09/12/2019   CREATININE 1.04 09/12/2019   Lab Results  Component Value Date   MICRALBCREAT 0.7 07/10/2019    Lab on 09/12/2019  Component Date Value Ref  Range Status  . Cholesterol 09/12/2019 135  0 - 200 mg/dL Final   ATP III Classification       Desirable:  < 200 mg/dL               Borderline High:  200 - 239 mg/dL          High:  > = 536 mg/dL  . Triglycerides 09/12/2019 82.0  0 - 149 mg/dL Final   Normal:  <144 mg/dLBorderline High:  150 - 199 mg/dL  . HDL 09/12/2019 48.40  >39.00 mg/dL Final  . VLDL 31/54/0086 16.4  0.0 - 40.0 mg/dL Final  . LDL Cholesterol 09/12/2019 70  0 - 99 mg/dL Final  . Total CHOL/HDL Ratio 09/12/2019 3   Final                  Men          Women1/2 Average Risk     3.4          3.3Average Risk          5.0          4.42X Average Risk          9.6          7.13X Average Risk          15.0          11.0                      . NonHDL 09/12/2019 86.35   Final   NOTE:  Non-HDL goal should be 30 mg/dL higher than patient's LDL goal (i.e. LDL goal of < 70 mg/dL, would have non-HDL goal of < 100  mg/dL)  . Fructosamine 09/12/2019 281  0 - 285 umol/L Final   Comment: Published reference interval for apparently healthy subjects between age 15 and 35 is 15 - 285 umol/L and in a poorly controlled diabetic population is 228 - 563 umol/L with a mean of 396 umol/L.   Marland Kitchen Sodium 09/12/2019 139  135 - 145 mEq/L Final  . Potassium 09/12/2019 4.0  3.5 - 5.1 mEq/L Final  . Chloride 09/12/2019 98  96 - 112 mEq/L Final  . CO2 09/12/2019 33* 19 - 32 mEq/L Final  . Glucose, Bld 09/12/2019 139* 70 - 99 mg/dL Final  . BUN 76/19/5093 11  6 - 23 mg/dL Final  . Creatinine, Ser 09/12/2019 1.04  0.40 - 1.50 mg/dL Final  . GFR 26/71/2458 90.11  >60.00 mL/min Final  . Calcium 09/12/2019 9.3  8.4 - 10.5 mg/dL Final  . Hgb K9X MFr Bld 09/12/2019 7.8* 4.6 - 6.5 % Final   Glycemic Control Guidelines for People with Diabetes:Non Diabetic:  <6%Goal of Therapy: <7%Additional Action Suggested:  >8%      Allergies as of 09/18/2019   No Known Allergies     Medication List       Accurate as of September 18, 2019  3:42 PM. If you have any questions, ask your nurse or doctor.        amLODipine 10 MG tablet Commonly known as: NORVASC Take 10 mg by mouth daily.   atorvastatin 80 MG tablet Commonly known as: LIPITOR Take 80 mg by mouth daily.   Caltrate 600+D Plus Minerals 600-800 MG-UNIT Tabs Take by mouth as needed.   glucose blood test strip Commonly known as: Contour Next Test Use as instructed   Jardiance 10 MG Tabs tablet Generic drug: empagliflozin TAKE  ONE TABLET BY MOUTH ONE TIME DAILY   metFORMIN 500 MG 24 hr tablet Commonly known as: GLUCOPHAGE-XR Take 4 tablets (2,000 mg total) by mouth daily with supper. What changed: additional instructions   multivitamin-iron-minerals-folic acid chewable tablet Chew 1 tablet by mouth daily.   Ozempic (1 MG/DOSE) 2 MG/1.5ML Sopn Generic drug: Semaglutide (1 MG/DOSE) inject 1mg  subcutaneously once weekly   Tresiba FlexTouch 100 UNIT/ML  FlexTouch Pen Generic drug: insulin degludec inject 0.53mls (24 units) into the skin once a day   valsartan-hydrochlorothiazide 320-25 MG tablet Commonly known as: DIOVAN-HCT Take 1 tablet by mouth daily.       Allergies: No Known Allergies  Past Medical History:  Diagnosis Date  . Diabetes mellitus   . Hyperlipidemia   . Hypertension     Past Surgical History:  Procedure Laterality Date  . LAPAROSCOPIC GASTRIC BANDING  01/29/2007    No family history on file.  Social History:  reports that he has never smoked. He has never used smokeless tobacco. He reports current alcohol use of about 5.0 standard drinks of alcohol per week. He reports that he does not use drugs.   Review of Systems   Lipid history:  LDL is below 100 Not clear if he is taking atorvastatin from his PCP Triglycerides mildly increased as follows:  . Lab Results  Component Value Date   CHOL 135 09/12/2019   CHOL 164 07/10/2019   CHOL 149 10/16/2018   Lab Results  Component Value Date   HDL 48.40 09/12/2019   HDL 37.80 (L) 07/10/2019   HDL 53.10 10/16/2018   Lab Results  Component Value Date   LDLCALC 70 09/12/2019   LDLCALC 63 10/16/2018   LDLCALC 85 01/15/2018   Lab Results  Component Value Date   TRIG 82.0 09/12/2019   TRIG (H) 07/10/2019    405.0 Triglyceride is over 400; calculations on Lipids are invalid.   TRIG 161.0 (H) 10/16/2018   Lab Results  Component Value Date   CHOLHDL 3 09/12/2019   CHOLHDL 4 07/10/2019   CHOLHDL 3 10/16/2018   Lab Results  Component Value Date   LDLDIRECT 78.0 07/10/2019   LDLDIRECT 71.0 06/20/2016            Hypertension: This is followed by PCP  Is on Micardis 80/25 HCT and amlodipine with good control   BP Readings from Last 3 Encounters:  09/18/19 122/86  07/17/19 110/68  04/17/19 112/82    Most recent eye exam was in 12/19, has not scheduled follow-up  Most recent foot exam: 1/21   LABS:  Lab on 09/12/2019  Component Date  Value Ref Range Status  . Cholesterol 09/12/2019 135  0 - 200 mg/dL Final   ATP III Classification       Desirable:  < 200 mg/dL               Borderline High:  200 - 239 mg/dL          High:  > = 09/14/2019 mg/dL  . Triglycerides 09/12/2019 82.0  0 - 149 mg/dL Final   Normal:  09/14/2019 mg/dLBorderline High:  150 - 199 mg/dL  . HDL 09/12/2019 48.40  >39.00 mg/dL Final  . VLDL 09/14/2019 16.4  0.0 - 40.0 mg/dL Final  . LDL Cholesterol 09/12/2019 70  0 - 99 mg/dL Final  . Total CHOL/HDL Ratio 09/12/2019 3   Final                  Men  Women1/2 Average Risk     3.4          3.3Average Risk          5.0          4.42X Average Risk          9.6          7.13X Average Risk          15.0          11.0                      . NonHDL 09/12/2019 86.35   Final   NOTE:  Non-HDL goal should be 30 mg/dL higher than patient's LDL goal (i.e. LDL goal of < 70 mg/dL, would have non-HDL goal of < 100 mg/dL)  . Fructosamine 09/12/2019 281  0 - 285 umol/L Final   Comment: Published reference interval for apparently healthy subjects between age 69 and 19 is 81 - 285 umol/L and in a poorly controlled diabetic population is 228 - 563 umol/L with a mean of 396 umol/L.   Marland Kitchen Sodium 09/12/2019 139  135 - 145 mEq/L Final  . Potassium 09/12/2019 4.0  3.5 - 5.1 mEq/L Final  . Chloride 09/12/2019 98  96 - 112 mEq/L Final  . CO2 09/12/2019 33* 19 - 32 mEq/L Final  . Glucose, Bld 09/12/2019 139* 70 - 99 mg/dL Final  . BUN 07/68/0881 11  6 - 23 mg/dL Final  . Creatinine, Ser 09/12/2019 1.04  0.40 - 1.50 mg/dL Final  . GFR 11/16/5943 90.11  >60.00 mL/min Final  . Calcium 09/12/2019 9.3  8.4 - 10.5 mg/dL Final  . Hgb O5F MFr Bld 09/12/2019 7.8* 4.6 - 6.5 % Final   Glycemic Control Guidelines for People with Diabetes:Non Diabetic:  <6%Goal of Therapy: <7%Additional Action Suggested:  >8%      Physical Examination:  BP 122/86 (BP Location: Left Arm, Patient Position: Sitting, Cuff Size: Large)   Pulse 82   Ht 6\' 4"  (1.93  m)   Wt 281 lb 9.6 oz (127.7 kg)   SpO2 94%   BMI 34.28 kg/m      ASSESSMENT:  Diabetes type 2, uncontrolled, with obesity  See history of present illness for detailed discussion of current diabetes management, blood sugar patterns and problems identified  His A1c is 7.8, improved  He has done a little better with diet and also with taking Jardiance regularly Discussed that likely can do better with exercise and diet regimen as well as monitoring after meals  Lipids: Better control and triglycerides are now normal     PLAN:   He was reminded about the need for exercise and he can start walking early morning or late evening, needs regular aerobic activity and not just his activity at work He needs to check his readings 2 hours after dinner on a regular basis Continue to work on cutting back on total calories, snacks and sweets No change in medications as yet     Patient Instructions  Walk daily  Check blood sugars on waking up 3 days a week  Also check blood sugars about 2 hours after meals and do this after different meals by rotation  Recommended blood sugar levels on waking up are 90-130 and about 2 hours after meal is 130-160  Please bring your blood sugar monitor to each visit, thank you     09/18/2019, 3:42 PM   Note: This office note  was prepared with Dragon voice recognition system technology. Any transcriptional errors that result from this process are unintentional.  

## 2019-09-18 NOTE — Patient Instructions (Addendum)
Walk daily  Check blood sugars on waking up 3 days a week  Also check blood sugars about 2 hours after meals and do this after different meals by rotation  Recommended blood sugar levels on waking up are 90-130 and about 2 hours after meal is 130-160  Please bring your blood sugar monitor to each visit, thank you

## 2019-09-18 NOTE — Progress Notes (Signed)
z

## 2019-10-27 ENCOUNTER — Telehealth: Payer: Self-pay | Admitting: Endocrinology

## 2019-10-28 MED ORDER — FLUCONAZOLE 150 MG PO TABS: 150.0000 mg | ORAL_TABLET | Freq: Once | ORAL | 0 refills | Status: AC

## 2019-10-28 NOTE — Telephone Encounter (Signed)
Medication sent per Dr. Lucianne Muss

## 2019-10-28 NOTE — Telephone Encounter (Signed)
Patient called to advise that [facility] stated his RX for Fluconazole

## 2019-10-28 NOTE — Telephone Encounter (Signed)
ok 

## 2019-10-28 NOTE — Telephone Encounter (Signed)
Patient is requesting a refill on the Diflucan. Please advise if okay to refill

## 2019-10-28 NOTE — Addendum Note (Signed)
Addended by: Lisabeth Pick on: 10/28/2019 02:25 PM   Modules accepted: Orders

## 2019-11-11 ENCOUNTER — Other Ambulatory Visit: Payer: Self-pay | Admitting: Endocrinology

## 2019-11-26 ENCOUNTER — Other Ambulatory Visit: Payer: Self-pay | Admitting: Endocrinology

## 2019-11-28 ENCOUNTER — Telehealth: Payer: Self-pay | Admitting: Endocrinology

## 2019-11-28 NOTE — Telephone Encounter (Signed)
Patient called asking if we could send the [place] to [facility] [place] instead of [facility] because they won't have it in stock until Dec 1.   [facility] [address] [place], [place] [address] 1 [phone]

## 2019-12-01 ENCOUNTER — Other Ambulatory Visit: Payer: Self-pay | Admitting: *Deleted

## 2019-12-01 MED ORDER — TRESIBA FLEXTOUCH 100 UNIT/ML ~~LOC~~ SOPN: PEN_INJECTOR | SUBCUTANEOUS | 0 refills | Status: DC

## 2019-12-01 NOTE — Telephone Encounter (Signed)
Rx sent 

## 2019-12-05 ENCOUNTER — Telehealth: Payer: Self-pay | Admitting: Endocrinology

## 2019-12-05 ENCOUNTER — Other Ambulatory Visit: Payer: Self-pay | Admitting: Endocrinology

## 2019-12-05 NOTE — Telephone Encounter (Signed)
Patient called to follow up on paperwork he dropped off with our office on 11/20/19.  Please contact patient at [phone] to advise.

## 2019-12-08 NOTE — Telephone Encounter (Signed)
Paperwork is up front, patient notified.

## 2019-12-17 ENCOUNTER — Other Ambulatory Visit: Payer: Self-pay | Admitting: *Deleted

## 2019-12-17 ENCOUNTER — Telehealth: Payer: Self-pay | Admitting: Endocrinology

## 2019-12-17 DIAGNOSIS — E1165 Type 2 diabetes mellitus with hyperglycemia: Secondary | ICD-10-CM

## 2019-12-17 DIAGNOSIS — Z794 Long term (current) use of insulin: Secondary | ICD-10-CM

## 2019-12-17 MED ORDER — CONTOUR NEXT TEST VI STRP: ORAL_STRIP | 3 refills | Status: DC

## 2019-12-17 NOTE — Telephone Encounter (Signed)
Patient called and requested a refill for Contour Next Test Strips to be sent to [facility] on [address]

## 2019-12-17 NOTE — Telephone Encounter (Signed)
Rx sent 

## 2019-12-23 ENCOUNTER — Other Ambulatory Visit (INDEPENDENT_AMBULATORY_CARE_PROVIDER_SITE_OTHER): Payer: 59

## 2019-12-23 ENCOUNTER — Other Ambulatory Visit: Payer: Self-pay

## 2019-12-23 DIAGNOSIS — Z794 Long term (current) use of insulin: Secondary | ICD-10-CM

## 2019-12-23 DIAGNOSIS — E1165 Type 2 diabetes mellitus with hyperglycemia: Secondary | ICD-10-CM | POA: Diagnosis not present

## 2019-12-23 LAB — COMPREHENSIVE METABOLIC PANEL
ALT: 16 U/L (ref 0–53)
AST: 16 U/L (ref 0–37)
Albumin: 4.5 g/dL (ref 3.5–5.2)
Alkaline Phosphatase: 102 U/L (ref 39–117)
BUN: 11 mg/dL (ref 6–23)
CO2: 35 mEq/L — ABNORMAL HIGH (ref 19–32)
Calcium: 9.6 mg/dL (ref 8.4–10.5)
Chloride: 96 mEq/L (ref 96–112)
Creatinine, Ser: 0.92 mg/dL (ref 0.40–1.50)
GFR: 94.58 mL/min (ref >60.00)
Glucose, Bld: 126 mg/dL — ABNORMAL HIGH (ref 70–99)
Potassium: 3.9 mEq/L (ref 3.5–5.1)
Sodium: 138 mEq/L (ref 135–145)
Total Bilirubin: 0.6 mg/dL (ref 0.2–1.2)
Total Protein: 7.6 g/dL (ref 6.0–8.3)

## 2019-12-23 LAB — HEMOGLOBIN A1C: Hgb A1c MFr Bld: 8.1 % — ABNORMAL HIGH (ref 4.6–6.5)

## 2019-12-25 ENCOUNTER — Encounter: Payer: Self-pay | Admitting: Endocrinology

## 2019-12-25 ENCOUNTER — Other Ambulatory Visit: Payer: Self-pay

## 2019-12-25 ENCOUNTER — Ambulatory Visit (INDEPENDENT_AMBULATORY_CARE_PROVIDER_SITE_OTHER): Payer: 59 | Admitting: Endocrinology

## 2019-12-25 VITALS — BP 124/86 | HR 79 | Resp 16 | Ht 74.0 in | Wt 287.1 lb

## 2019-12-25 DIAGNOSIS — E1165 Type 2 diabetes mellitus with hyperglycemia: Secondary | ICD-10-CM | POA: Diagnosis not present

## 2019-12-25 DIAGNOSIS — Z794 Long term (current) use of insulin: Secondary | ICD-10-CM

## 2019-12-25 NOTE — Patient Instructions (Addendum)
Check blood sugars on waking up 3 days a week  Also check blood sugars about 2 hours after meals and do this after different meals by rotation  Recommended blood sugar levels on waking up are 90-130 and about 2 hours after meal is 130-160  Please bring your blood sugar monitor to each visit, thank you  Call if sugar after meals > 180  Call insurance re: meter coverage

## 2019-12-25 NOTE — Progress Notes (Signed)
Noninfective printed Told her as it has been outside not sure if it is          Reason for Appointment: Follow-up for Type 2 Diabetes  Referring physician: Ehinger   History of Present Illness:          Date of diagnosis of type 2 diabetes mellitus:?  1997        Background history:   He was symptomatic at diagnosis with increased urination He has been mostly treated with metformin and glipizide in the past He was taking Januvia more recently but this was changed in 2017 2 Onglyza because of insurance preference He does not think his sugars have been consistently controlled in the past, best A1c recently appears to be in 05/2014 which was 7.5 but subsequently has been at least 8%  Recent history:   TRESIBA insulin: 24 units daily  Non-insulin hypoglycemic drugs: Metformin ER 500, 4 tablets in a.m., Jardiance 10 mg , Ozempic 1 mg weekly   A1c has been consistently higher now 8.1 compared to 7.8  Fructosamine previously 281  Current management, blood sugar patterns and problems identified:  He has expired test strips but only checked 5 times recently and mostly in the morning  He says he has difficulty getting the test strips for contour approved by his insurance  Has before not checking blood sugars after meals  Fasting readings are fairly good including 126 in the lab  He has taken his Jardiance regularly and was given Diflucan on the last visit  He did stop it for about a week recently because of balanitis  Has not done any regular exercise  Not motivated to watch his diet and weight is up slightly  Taking Ozempic regularly        Side effects from medications have been: none  Compliance with the medical regimen: Inadequate Hypoglycemia:   None  Glucose monitoring: Using contour meter  Has 5 readings in the last month  Fasting range 107-148, 11 AM = 143  PREVIOUS FASTING range 107-147 with AVERAGE 125  Bedtime 154   Self-care: The diet that the  patient has been following is: tries to limit fast food usually .     Meal times are:  Breakfast is at 9 AM-12 pm Lunch: 2-4 PM Dinner:  6-8 PM              Dietician visit, most recent: 9/17  Weight history:   He had a gastric band in 2009 but this was loosened a couple of years ago because with taking metformin he would get uncomfortably bloated and has gained weight  Wt Readings from Last 3 Encounters:  12/25/19 287 lb 2 oz (130.2 kg)  09/18/19 281 lb 9.6 oz (127.7 kg)  07/17/19 285 lb 9.6 oz (129.5 kg)    Glycemic control:    Lab Results  Component Value Date   HGBA1C 8.1 (H) 12/23/2019   HGBA1C 7.8 (H) 09/12/2019   HGBA1C 8.7 (H) 07/10/2019   Lab Results  Component Value Date   MICROALBUR 1.8 07/10/2019   LDLCALC 70 09/12/2019   CREATININE 0.92 12/23/2019   Lab Results  Component Value Date   MICRALBCREAT 0.7 07/10/2019    Lab on 12/23/2019  Component Date Value Ref Range Status  . Sodium 12/23/2019 138  135 - 145 mEq/L Final  . Potassium 12/23/2019 3.9  3.5 - 5.1 mEq/L Final  . Chloride 12/23/2019 96  96 - 112 mEq/L Final  . CO2 12/23/2019 35* 19 -  32 mEq/L Final  . Glucose, Bld 12/23/2019 126* 70 - 99 mg/dL Final  . BUN 42/70/6237 11  6 - 23 mg/dL Final  . Creatinine, Ser 12/23/2019 0.92  0.40 - 1.50 mg/dL Final  . Total Bilirubin 12/23/2019 0.6  0.2 - 1.2 mg/dL Final  . Alkaline Phosphatase 12/23/2019 102  39 - 117 U/L Final  . AST 12/23/2019 16  0 - 37 U/L Final  . ALT 12/23/2019 16  0 - 53 U/L Final  . Total Protein 12/23/2019 7.6  6.0 - 8.3 g/dL Final  . Albumin 62/83/1517 4.5  3.5 - 5.2 g/dL Final  . GFR 61/60/7371 94.58  >60.00 mL/min Final   Calculated using the CKD-EPI Creatinine Equation (2021)  . Calcium 12/23/2019 9.6  8.4 - 10.5 mg/dL Final  . Hgb G6Y MFr Bld 12/23/2019 8.1* 4.6 - 6.5 % Final   Glycemic Control Guidelines for People with Diabetes:Non Diabetic:  <6%Goal of Therapy: <7%Additional Action Suggested:  >8%      Allergies as of  12/25/2019   No Known Allergies     Medication List       Accurate as of December 25, 2019  2:58 PM. If you have any questions, ask your nurse or doctor.        amLODipine 10 MG tablet Commonly known as: NORVASC Take 10 mg by mouth daily.   atorvastatin 80 MG tablet Commonly known as: LIPITOR Take 80 mg by mouth daily.   Caltrate 600+D Plus Minerals 600-800 MG-UNIT Tabs Take by mouth as needed.   Contour Next Test test strip Generic drug: glucose blood Use as instructed   gabapentin 300 MG capsule Commonly known as: NEURONTIN Take by mouth.   Jardiance 10 MG Tabs tablet Generic drug: empagliflozin TAKE ONE TABLET BY MOUTH ONE TIME DAILY   metFORMIN 500 MG 24 hr tablet Commonly known as: GLUCOPHAGE-XR Take 4 tablets (2,000 mg total) by mouth daily with supper. What changed: additional instructions   metoprolol succinate 100 MG 24 hr tablet Commonly known as: TOPROL-XL Take 100 mg by mouth daily.   multivitamin-iron-minerals-folic acid chewable tablet Chew 1 tablet by mouth daily.   nystatin cream Commonly known as: MYCOSTATIN Apply 1 application topically 2 (two) times daily.   Ozempic (1 MG/DOSE) 4 MG/3ML Sopn Generic drug: Semaglutide (1 MG/DOSE) INJECT 1MG  INTO THE SKIN ONCE A WEEK   telmisartan-hydrochlorothiazide 80-25 MG tablet Commonly known as: MICARDIS HCT Take 1 tablet by mouth daily.   FlexTouch 100 UNIT/ML FlexTouch Pen Generic drug: insulin degludec inject 0.75mls (24 units) into the skin once a day       Allergies: No Known Allergies  Past Medical History:  Diagnosis Date  . Diabetes mellitus   . Hyperlipidemia   . Hypertension     Past Surgical History:  Procedure Laterality Date  . LAPAROSCOPIC GASTRIC BANDING  01/29/2007    No family history on file.  Social History:  reports that he has never smoked. He has never used smokeless tobacco. He reports current alcohol use of about 5.0 standard drinks of alcohol per  week. He reports that he does not use drugs.   Review of Systems   Lipid history:  LDL is below 100 Prescribed Lipitor 80 mg from PCP  . Lab Results  Component Value Date   CHOL 135 09/12/2019   CHOL 164 07/10/2019   CHOL 149 10/16/2018   Lab Results  Component Value Date   HDL 48.40 09/12/2019   HDL 37.80 (L) 07/10/2019  HDL 53.10 10/16/2018   Lab Results  Component Value Date   LDLCALC 70 09/12/2019   LDLCALC 63 10/16/2018   LDLCALC 85 01/15/2018   Lab Results  Component Value Date   TRIG 82.0 09/12/2019   TRIG (H) 07/10/2019    405.0 Triglyceride is over 400; calculations on Lipids are invalid.   TRIG 161.0 (H) 10/16/2018   Lab Results  Component Value Date   CHOLHDL 3 09/12/2019   CHOLHDL 4 07/10/2019   CHOLHDL 3 10/16/2018   Lab Results  Component Value Date   LDLDIRECT 78.0 07/10/2019   LDLDIRECT 71.0 06/20/2016            Hypertension: This is followed by PCP  Is on Micardis 80/25 HCT and amlodipine with good control   BP Readings from Last 3 Encounters:  12/25/19 124/86  09/18/19 122/86  07/17/19 110/68    Most recent eye exam was in 1/21, has not scheduled follow-up  Most recent foot exam: 1/21  He has sleep apnea and he says he is getting a new machine, recently not using the CPAP  LABS:  Lab on 12/23/2019  Component Date Value Ref Range Status  . Sodium 12/23/2019 138  135 - 145 mEq/L Final  . Potassium 12/23/2019 3.9  3.5 - 5.1 mEq/L Final  . Chloride 12/23/2019 96  96 - 112 mEq/L Final  . CO2 12/23/2019 35* 19 - 32 mEq/L Final  . Glucose, Bld 12/23/2019 126* 70 - 99 mg/dL Final  . BUN 63/87/5643 11  6 - 23 mg/dL Final  . Creatinine, Ser 12/23/2019 0.92  0.40 - 1.50 mg/dL Final  . Total Bilirubin 12/23/2019 0.6  0.2 - 1.2 mg/dL Final  . Alkaline Phosphatase 12/23/2019 102  39 - 117 U/L Final  . AST 12/23/2019 16  0 - 37 U/L Final  . ALT 12/23/2019 16  0 - 53 U/L Final  . Total Protein 12/23/2019 7.6  6.0 - 8.3 g/dL Final  .  Albumin 32/95/1884 4.5  3.5 - 5.2 g/dL Final  . GFR 16/60/6301 94.58  >60.00 mL/min Final   Calculated using the CKD-EPI Creatinine Equation (2021)  . Calcium 12/23/2019 9.6  8.4 - 10.5 mg/dL Final  . Hgb S0F MFr Bld 12/23/2019 8.1* 4.6 - 6.5 % Final   Glycemic Control Guidelines for People with Diabetes:Non Diabetic:  <6%Goal of Therapy: <7%Additional Action Suggested:  >8%      Physical Examination:  BP 124/86 (BP Location: Left Arm, Patient Position: Sitting, Cuff Size: Normal)   Pulse 79   Resp 16   Ht 6\' 2"  (1.88 m)   Wt 287 lb 2 oz (130.2 kg)   SpO2 96%   BMI 36.86 kg/m      ASSESSMENT:  Diabetes type 2, uncontrolled, with obesity  See history of present illness for detailed discussion of current diabetes management, blood sugar patterns and problems identified  His A1c is 8.1  A1c is likely a little higher than expected since previous fructosamine was 281 He is on Ozempic, Jardiance, Metformin and basal insulin  Recently fasting readings are not significantly high although using an expired test strips Likely has high postprandial readings from not watching his diet or coupled with insulin deficiency at mealtimes   Hypertension: Blood pressure is relatively high but he has had better readings with his PCP     PLAN:   He will start going to the gym at least every other day Start checking readings after meals consistently He will let know  if he needs a different brand name meter for his monitoring after checking with insurance If he has readings over 180 consistently will start NovoLog with each meal No change in [place]    Patient Instructions  Check blood sugars on waking up 3 days a week  Also check blood sugars about 2 hours after meals and do this after different meals by rotation  Recommended blood sugar levels on waking up are 90-130 and about 2 hours after meal is 130-160  Please bring your blood sugar monitor to each visit, thank you  Call if  sugar after meals > 180  Call insurance re: meter coverage   Reather Littler 12/25/2019, 2:58 PM   Note: This office note was prepared with Dragon voice recognition system technology. Any transcriptional errors that result from this process are unintentional.

## 2019-12-26 ENCOUNTER — Telehealth: Payer: Self-pay | Admitting: Endocrinology

## 2019-12-26 NOTE — Telephone Encounter (Signed)
Patient called to requests  A new RX for Relion Test Strips [employer] does not cover Contour Next Test Strips) be sent to  [id number] - [place], [place] - [address] Phone:  [phone]  Fax:  [phone]

## 2019-12-29 ENCOUNTER — Other Ambulatory Visit: Payer: Self-pay | Admitting: *Deleted

## 2019-12-29 DIAGNOSIS — E1165 Type 2 diabetes mellitus with hyperglycemia: Secondary | ICD-10-CM

## 2019-12-29 DIAGNOSIS — Z794 Long term (current) use of insulin: Secondary | ICD-10-CM

## 2019-12-29 MED ORDER — ONETOUCH VERIO IQ SYSTEM W/DEVICE KIT: PACK | 0 refills | Status: DC

## 2019-12-29 MED ORDER — ONETOUCH DELICA PLUS LANCET33G MISC: 3 refills | Status: DC

## 2019-12-29 MED ORDER — ONETOUCH VERIO VI STRP: ORAL_STRIP | 3 refills | Status: DC

## 2019-12-29 NOTE — Telephone Encounter (Signed)
Okay to send Relion in or is there another brand you want sent?

## 2019-12-29 NOTE — Telephone Encounter (Signed)
Rx sent for one touch verio meter, strips and lancets

## 2019-12-29 NOTE — Telephone Encounter (Signed)
He can find out if Accu-Chek or the One Touch Verio is better covered.  We cannot download generic monitors. Relion can be obtained without a prescription

## 2019-12-31 ENCOUNTER — Other Ambulatory Visit: Payer: Self-pay | Admitting: *Deleted

## 2019-12-31 MED ORDER — ONETOUCH VERIO FLEX SYSTEM W/DEVICE KIT: PACK | 0 refills | Status: DC

## 2020-01-08 ENCOUNTER — Other Ambulatory Visit: Payer: Self-pay | Admitting: Endocrinology

## 2020-03-09 ENCOUNTER — Other Ambulatory Visit: Payer: Self-pay | Admitting: Endocrinology

## 2020-03-18 ENCOUNTER — Other Ambulatory Visit: Payer: Self-pay

## 2020-03-18 ENCOUNTER — Other Ambulatory Visit (INDEPENDENT_AMBULATORY_CARE_PROVIDER_SITE_OTHER): Payer: 59

## 2020-03-18 DIAGNOSIS — E1165 Type 2 diabetes mellitus with hyperglycemia: Secondary | ICD-10-CM

## 2020-03-18 DIAGNOSIS — Z794 Long term (current) use of insulin: Secondary | ICD-10-CM

## 2020-03-18 LAB — BASIC METABOLIC PANEL
BUN: 13 mg/dL (ref 6–23)
CO2: 34 mEq/L — ABNORMAL HIGH (ref 19–32)
Calcium: 10.2 mg/dL (ref 8.4–10.5)
Chloride: 98 mEq/L (ref 96–112)
Creatinine, Ser: 1.01 mg/dL (ref 0.40–1.50)
GFR: 84.42 mL/min (ref >60.00)
Glucose, Bld: 137 mg/dL — ABNORMAL HIGH (ref 70–99)
Potassium: 4.4 mEq/L (ref 3.5–5.1)
Sodium: 139 mEq/L (ref 135–145)

## 2020-03-18 LAB — HEMOGLOBIN A1C: Hgb A1c MFr Bld: 8.1 % — ABNORMAL HIGH (ref 4.6–6.5)

## 2020-03-25 ENCOUNTER — Encounter: Payer: Self-pay | Admitting: Endocrinology

## 2020-03-25 ENCOUNTER — Ambulatory Visit (INDEPENDENT_AMBULATORY_CARE_PROVIDER_SITE_OTHER): Payer: 59 | Admitting: Endocrinology

## 2020-03-25 ENCOUNTER — Other Ambulatory Visit: Payer: Self-pay | Admitting: Endocrinology

## 2020-03-25 ENCOUNTER — Other Ambulatory Visit: Payer: Self-pay

## 2020-03-25 VITALS — BP 118/86 | HR 80 | Resp 20 | Ht 75.0 in | Wt 286.0 lb

## 2020-03-25 DIAGNOSIS — Z794 Long term (current) use of insulin: Secondary | ICD-10-CM | POA: Diagnosis not present

## 2020-03-25 DIAGNOSIS — E1165 Type 2 diabetes mellitus with hyperglycemia: Secondary | ICD-10-CM

## 2020-03-25 NOTE — Patient Instructions (Signed)
Check blood sugars on waking up 3 days a week  Also check blood sugars about 2 hours after meals and do this after different meals by rotation  Recommended blood sugar levels on waking up are 90-130 and about 2 hours after meal is 130-160  Please bring your blood sugar monitor to each visit, thank you  Take 2 Jardiance and if ok call for 25mg  rx

## 2020-03-25 NOTE — Progress Notes (Addendum)
Reason for Appointment: Follow-up for Type 2 Diabetes  Referring physician: Ehinger   History of Present Illness:          Date of diagnosis of type 2 diabetes mellitus:?  1997        Background history:   He was symptomatic at diagnosis with increased urination He has been mostly treated with metformin and glipizide in the past He was taking Januvia more recently but this was changed in 2017 2 Onglyza because of insurance preference He does not think his sugars have been consistently controlled in the past, best A1c recently appears to be in 05/2014 which was 7.5 but subsequently has been at least 8%  Recent history:   TRESIBA insulin: 24 units daily  Non-insulin hypoglycemic drugs: Metformin ER 500, 4 tablets in a.m., Jardiance 10 mg , Ozempic 1 mg weekly   A1c has been consistently higher now 8.1 compared to 7.8  Fructosamine previously 281  Current management, blood sugar patterns and problems identified: He has been told to get new test strips on the last visit Also he has finally started checking his blood sugars more frequently and some readings after dinner also Not clear why his A1c is high since his blood sugars are generally well controlled at home Most of the time he has checked in the morning with fairly consistent results Highest reading was 200 after lunch but ate a candy bar Otherwise he has occasionally missed his [place] dose Renal function normal with continuing Jardiance Fasting readings are fairly good including 126 in the lab He has taken his Jardiance regularly and had to leave it off only about a week because of balanitis months since his last visit Currently not regular with exercise and only now starting back occasionally at the gym with improvement in the pandemic Taking Ozempic regularly        Side effects from medications have been: none  Compliance with the medical regimen: Inconsistent Hypoglycemia:   None  Glucose monitoring:  Using contour meter Readings and averages from download:  PRE-MEAL Fasting Lunch Dinner Bedtime Overall  Glucose range:  99-149      Mean/median:  121   119  149     POST-MEAL PC Breakfast PC Lunch PC Dinner  Glucose range:     Mean/median:   164    Previously: Fasting range 107-148, 11 AM = 143  PREVIOUS FASTING range 107-147 with AVERAGE 125  Bedtime 154   Self-care: The diet that the patient has been following is: tries to limit fast food usually .     Meal times are:  Breakfast is at 9 AM-12 pm Lunch: 2-4 PM Dinner:  6-8 PM              Dietician visit, most recent: 9/17  Weight history:   He had a gastric band in 2009 but this was loosened a couple of years ago because with taking metformin he would get uncomfortably bloated and has gained weight  Wt Readings from Last 3 Encounters:  03/25/20 286 lb (129.7 kg)  12/25/19 287 lb 2 oz (130.2 kg)  09/18/19 281 lb 9.6 oz (127.7 kg)    Glycemic control:    Lab Results  Component Value Date   HGBA1C 8.1 (H) 03/18/2020   HGBA1C 8.1 (H) 12/23/2019   HGBA1C 7.8 (H) 09/12/2019   Lab Results  Component Value Date   MICROALBUR 1.8 07/10/2019   [place] 70 09/12/2019  CREATININE 1.01 03/18/2020   Lab Results  Component Value Date   MICRALBCREAT 0.7 07/10/2019    No visits with results within 1 Week(s) from this visit.  Latest known visit with results is:  Lab on 03/18/2020  Component Date Value Ref Range Status   Sodium 03/18/2020 139  135 - 145 mEq/L Final   Potassium 03/18/2020 4.4  3.5 - 5.1 mEq/L Final   Chloride 03/18/2020 98  96 - 112 mEq/L Final   CO2 03/18/2020 34* 19 - 32 mEq/L Final   Glucose, Bld 03/18/2020 137* 70 - 99 mg/dL Final   BUN 03/18/2020 13  6 - 23 mg/dL Final   Creatinine, Ser 03/18/2020 1.01  0.40 - 1.50 mg/dL Final   GFR 03/18/2020 84.42  >60.00 mL/min Final   Calculated using the CKD-EPI Creatinine Equation (2021)   Calcium 03/18/2020 10.2  8.4 - 10.5 mg/dL Final   Hgb A1c MFr Bld  03/18/2020 8.1* 4.6 - 6.5 % Final   Glycemic Control Guidelines for People with Diabetes:Non Diabetic:  <6%Goal of Therapy: <7%Additional Action Suggested:  >8%      Allergies as of 03/25/2020   No Known Allergies      Medication List        Accurate as of March 25, 2020  2:28 PM. If you have any questions, ask your nurse or doctor.          amLODipine 10 MG tablet Commonly known as: NORVASC Take 10 mg by mouth daily.   atorvastatin 80 MG tablet Commonly known as: LIPITOR Take 80 mg by mouth daily.   Caltrate 600+D Plus Minerals 600-800 MG-UNIT Tabs Take by mouth as needed.   Contour Next Test test strip Generic drug: glucose blood Use as instructed   OneTouch Verio test strip Generic drug: glucose blood Use as instructed to check blood sugar 2 times per day   gabapentin 300 MG capsule Commonly known as: NEURONTIN Take by mouth.   Jardiance 10 MG Tabs tablet Generic drug: empagliflozin TAKE ONE TABLET BY MOUTH ONE TIME DAILY   metFORMIN 500 MG 24 hr tablet Commonly known as: GLUCOPHAGE-XR Take 4 tablets (2,000 mg total) by mouth daily with supper. What changed: additional instructions   metoprolol succinate 100 MG 24 hr tablet Commonly known as: TOPROL-XL Take 100 mg by mouth daily.   multivitamin-iron-minerals-folic acid chewable tablet Chew 1 tablet by mouth daily.   nystatin cream Commonly known as: MYCOSTATIN Apply 1 application topically 2 (two) times daily.   OneTouch Delica Plus [id number] Misc Use to check blood sugar 2 times a day   OneTouch Verio Flex System w/Device Kit Use to check blood sugar 2 times a day   Ozempic (1 MG/DOSE) 4 MG/3ML Sopn Generic drug: Semaglutide (1 MG/DOSE) INJECT 1MG INTO THE SKIN ONCE A WEEK   telmisartan-hydrochlorothiazide 80-25 MG tablet Commonly known as: MICARDIS HCT Take 1 tablet by mouth daily.   Tyler Aas FlexTouch 100 UNIT/ML FlexTouch Pen Generic drug: insulin degludec inject 0.89ms (24 units)  into the skin once a day        Allergies: No Known Allergies  Past Medical History:  Diagnosis Date   Diabetes mellitus    Hyperlipidemia    Hypertension     Past Surgical History:  Procedure Laterality Date   LAPAROSCOPIC GASTRIC BANDING  01/29/2007    No family history on file.  Social History:  reports that he has never smoked. He has never used smokeless tobacco. He reports current alcohol use of about 5.0  standard drinks of alcohol per week. He reports that he does not use drugs.   Review of Systems   Lipid history:  LDL is below 100 Prescribed Lipitor 80 mg from PCP  . Lab Results  Component Value Date   CHOL 135 09/12/2019   CHOL 164 07/10/2019   CHOL 149 10/16/2018   Lab Results  Component Value Date   HDL 48.40 09/12/2019   HDL 37.80 (L) 07/10/2019   HDL 53.10 10/16/2018   Lab Results  Component Value Date   LDLCALC 70 09/12/2019   LDLCALC 63 10/16/2018   [place] 85 01/15/2018   Lab Results  Component Value Date   TRIG 82.0 09/12/2019   TRIG (H) 07/10/2019    405.0 Triglyceride is over 400; calculations on Lipids are invalid.   TRIG 161.0 (H) 10/16/2018   Lab Results  Component Value Date   CHOLHDL 3 09/12/2019   CHOLHDL 4 07/10/2019   CHOLHDL 3 10/16/2018   Lab Results  Component Value Date   LDLDIRECT 78.0 07/10/2019   LDLDIRECT 71.0 06/20/2016            Hypertension: This is followed by PCP  Is on Micardis 80/25 HCT and amlodipine 10 mg   BP Readings from Last 3 Encounters:  03/25/20 118/86  12/25/19 124/86  09/18/19 122/86    Most recent eye exam was in 1/21, has not scheduled follow-up  Most recent foot exam: 1/21  He has sleep apnea and he says he is waiting for a new machine, not using the CPAP  LABS:  No visits with results within 1 Week(s) from this visit.  Latest known visit with results is:  Lab on 03/18/2020  Component Date Value Ref Range Status   Sodium 03/18/2020 139  135 - 145 mEq/L Final    Potassium 03/18/2020 4.4  3.5 - 5.1 mEq/L Final   Chloride 03/18/2020 98  96 - 112 mEq/L Final   CO2 03/18/2020 34* 19 - 32 mEq/L Final   Glucose, Bld 03/18/2020 137* 70 - 99 mg/dL Final   BUN 03/18/2020 13  6 - 23 mg/dL Final   Creatinine, Ser 03/18/2020 1.01  0.40 - 1.50 mg/dL Final   GFR 03/18/2020 84.42  >60.00 mL/min Final   Calculated using the CKD-EPI Creatinine Equation (2021)   Calcium 03/18/2020 10.2  8.4 - 10.5 mg/dL Final   Hgb A1c MFr Bld 03/18/2020 8.1* 4.6 - 6.5 % Final   Glycemic Control Guidelines for People with Diabetes:Non Diabetic:  <6%Goal of Therapy: <7%Additional Action Suggested:  >8%      Physical Examination:  BP 118/86 (BP Location: Left Arm)    Pulse 80    Resp 20    Ht 6' 3" (1.905 m)    Wt 286 lb (129.7 kg)    SpO2 96%    BMI 35.75 kg/m      ASSESSMENT:  Diabetes type 2, uncontrolled, with obesity  See history of present illness for detailed discussion of current diabetes management, blood sugar patterns and problems identified  His A1c is 8.1  A1c is again a little higher than expected since previous fructosamine was 281 He is on Ozempic, Jardiance, Metformin and basal insulin  Most likely has high postprandial readings to account for his high A1c which he has not monitored now but has only rare high blood sugars on recent monitoring Is refusing to consider continuous glucose sensor Again his diet and exercise regimen can be better and occasionally blood sugars may be high from missing  his [place] Aas  Hypertension: Blood pressure is relatively high but he is on maximum doses of current medications and will need to follow-up with his PCP   PLAN:    We will check fructosamine to reassess his sugars along with A1c He will start checking blood sugars more often after meals and discussed blood sugar targets Try to take [place] Aas more consistently Trial of 20 mg Jardiance together in the morning and if tolerated he will call for prescription for 25  mg Start regular exercise 3 or 4 times a week at least Consistently try to avoid high-fat foods and fast food   Patient Instructions  Check blood sugars on waking up 3 days a week  Also check blood sugars about 2 hours after meals and do this after different meals by rotation  Recommended blood sugar levels on waking up are 90-130 and about 2 hours after meal is 130-160  Please bring your blood sugar monitor to each visit, thank you  Take 2 Jardiance and if ok call for 29m rx    AElayne Snare3/10/2020, 2:28 PM   Note: This office note was prepared with Dragon voice recognition system technology. Any transcriptional errors that result from this process are unintentional.

## 2020-04-23 ENCOUNTER — Other Ambulatory Visit: Payer: Self-pay | Admitting: Endocrinology

## 2020-05-31 ENCOUNTER — Other Ambulatory Visit: Payer: Self-pay | Admitting: Endocrinology

## 2020-06-08 ENCOUNTER — Other Ambulatory Visit: Payer: Self-pay | Admitting: Endocrinology

## 2020-06-29 ENCOUNTER — Ambulatory Visit (INDEPENDENT_AMBULATORY_CARE_PROVIDER_SITE_OTHER): Payer: 59

## 2020-06-29 ENCOUNTER — Ambulatory Visit (INDEPENDENT_AMBULATORY_CARE_PROVIDER_SITE_OTHER): Payer: 59 | Admitting: Orthopedic Surgery

## 2020-06-29 ENCOUNTER — Other Ambulatory Visit (INDEPENDENT_AMBULATORY_CARE_PROVIDER_SITE_OTHER): Payer: 59

## 2020-06-29 ENCOUNTER — Other Ambulatory Visit: Payer: 59

## 2020-06-29 DIAGNOSIS — G8929 Other chronic pain: Secondary | ICD-10-CM | POA: Diagnosis not present

## 2020-06-29 DIAGNOSIS — M25561 Pain in right knee: Secondary | ICD-10-CM

## 2020-06-29 DIAGNOSIS — Z794 Long term (current) use of insulin: Secondary | ICD-10-CM

## 2020-06-29 DIAGNOSIS — E1165 Type 2 diabetes mellitus with hyperglycemia: Secondary | ICD-10-CM | POA: Diagnosis not present

## 2020-06-29 LAB — MICROALBUMIN / CREATININE URINE RATIO
Creatinine,U: 127.6 mg/dL
Microalb Creat Ratio: 0.5 mg/g (ref 0.0–30.0)
Microalb, Ur: <0.7 mg/dL (ref 0.0–1.9)

## 2020-06-29 LAB — BASIC METABOLIC PANEL
BUN: 13 mg/dL (ref 6–23)
CO2: 32 mEq/L (ref 19–32)
Calcium: 9.6 mg/dL (ref 8.4–10.5)
Chloride: 99 mEq/L (ref 96–112)
Creatinine, Ser: 1.07 mg/dL (ref 0.40–1.50)
GFR: 78.61 mL/min (ref >60.00)
Glucose, Bld: 158 mg/dL — ABNORMAL HIGH (ref 70–99)
Potassium: 3.9 mEq/L (ref 3.5–5.1)
Sodium: 141 mEq/L (ref 135–145)

## 2020-06-29 LAB — HEMOGLOBIN A1C: Hgb A1c MFr Bld: 8.3 % — ABNORMAL HIGH (ref 4.6–6.5)

## 2020-06-30 LAB — FRUCTOSAMINE: Fructosamine: 303 umol/L — ABNORMAL HIGH (ref 0–285)

## 2020-07-02 ENCOUNTER — Ambulatory Visit (INDEPENDENT_AMBULATORY_CARE_PROVIDER_SITE_OTHER): Payer: 59 | Admitting: Endocrinology

## 2020-07-02 ENCOUNTER — Other Ambulatory Visit: Payer: Self-pay

## 2020-07-02 ENCOUNTER — Encounter: Payer: Self-pay | Admitting: Endocrinology

## 2020-07-02 VITALS — BP 150/92 | HR 72 | Ht 75.0 in | Wt 280.0 lb

## 2020-07-02 DIAGNOSIS — I10 Essential (primary) hypertension: Secondary | ICD-10-CM

## 2020-07-02 DIAGNOSIS — E1165 Type 2 diabetes mellitus with hyperglycemia: Secondary | ICD-10-CM

## 2020-07-02 DIAGNOSIS — Z794 Long term (current) use of insulin: Secondary | ICD-10-CM | POA: Diagnosis not present

## 2020-07-02 NOTE — Progress Notes (Signed)
Reason for Appointment: Follow-up for Type 2 Diabetes  Referring physician: Ehinger   History of Present Illness:          Date of diagnosis of type 2 diabetes mellitus:?  1997        Background history:   He was symptomatic at diagnosis with increased urination He has been mostly treated with metformin and glipizide in the past He was taking Januvia more recently but this was changed in 2017 2 Onglyza because of insurance preference He does not think his sugars have been consistently controlled in the past, best A1c recently appears to be in 05/2014 which was 7.5 but subsequently has been at least 8%  Recent history:   TRESIBA insulin: 24 units daily  Non-insulin hypoglycemic drugs: Metformin ER 500, 4 tablets in a.m., Jardiance 20  mg , Ozempic 1 mg weekly   A1c has been consistently higher now 8.1 compared to 7.8  Fructosamine previously 281  Current management, blood sugar patterns and problems identified: He has checked his blood sugar only 5 times in the last month and mostly in the mornings again  although he has not clearly had high blood sugars after dinner or meals not clear why his A1c is still significantly high, fasting blood sugars are generally under 150 Not clear why his A1c is high since his blood sugars are generally well controlled at home He has not been exercising because of knee pain and recently starting to feel better after a steroid injection He has lost 6 pounds He thinks he is taking his [place] A[place] more regularly; however he does not take it at the same time every day Renal function normal with continuing Jardiance Has taken his Ozempic regularly. He says he is taking 20 mg of Jardiance but did not call for new prescription for 25 mg and not clear if he is taking this regularly       Side effects from medications have been: none  Compliance with the medical regimen: Inconsistent Hypoglycemia:   None  Glucose monitoring: Using contour  meter Readings: Has 5 readings in the last month Recent range 124-174, most glucose readings before 9 AM  OVERALL average 140  Previously:  PRE-MEAL Fasting Lunch Dinner Bedtime Overall  Glucose range:  99-149     9 9-200  Mean/median:  121   119  149    POST-MEAL PC Breakfast PC Lunch PC Dinner  Glucose range:     Mean/median:   164      Self-care: The diet that the patient has been following is: tries to limit fast food usually .     Meal times are:  Breakfast is at 9 AM-12 pm Lunch: 2-4 PM Dinner:  6-8 PM              Dietician visit, most recent: 9/17  Weight history:   He had a gastric band in 2009 but this was loosened a couple of years ago because with taking metformin he would get uncomfortably bloated and has gained weight  Wt Readings from Last 3 Encounters:  07/02/20 280 lb (127 kg)  03/25/20 286 lb (129.7 kg)  12/25/19 287 lb 2 oz (130.2 kg)    Glycemic control:    Lab Results  Component Value Date   HGBA1C 8.3 (H) 06/29/2020   HGBA1C 8.1 (H) 03/18/2020   HGBA1C 8.1 (H) 12/23/2019   Lab Results  Component Value Date   MICROALBUR <0.7 06/29/2020  LDLCALC 70 09/12/2019   CREATININE 1.07 06/29/2020   Lab Results  Component Value Date   MICRALBCREAT 0.5 06/29/2020    Appointment on 06/29/2020  Component Date Value Ref Range Status   Microalb, Ur 06/29/2020 <0.7  0.0 - 1.9 mg/dL Final   Creatinine,U 06/29/2020 127.6  mg/dL Final   Microalb Creat Ratio 06/29/2020 0.5  0.0 - 30.0 mg/g Final   Fructosamine 06/29/2020 303 (A) 0 - 285 umol/L Final   Comment: Published reference interval for apparently healthy subjects between age 77 and 35 is 26 - 285 umol/L and in a poorly controlled diabetic population is 228 - 563 umol/L with a mean of 396 umol/L.    Sodium 06/29/2020 141  135 - 145 mEq/L Final   Potassium 06/29/2020 3.9  3.5 - 5.1 mEq/L Final   Chloride 06/29/2020 99  96 - 112 mEq/L Final   CO2 06/29/2020 32  19 - 32 mEq/L Final   Glucose,  Bld 06/29/2020 158 (A) 70 - 99 mg/dL Final   BUN 06/29/2020 13  6 - 23 mg/dL Final   Creatinine, Ser 06/29/2020 1.07  0.40 - 1.50 mg/dL Final   GFR 06/29/2020 78.61  >60.00 mL/min Final   Calculated using the CKD-EPI Creatinine Equation (2021)   Calcium 06/29/2020 9.6  8.4 - 10.5 mg/dL Final   Hgb A1c MFr Bld 06/29/2020 8.3 (A) 4.6 - 6.5 % Final   Glycemic Control Guidelines for People with Diabetes:Non Diabetic:  <6%Goal of Therapy: <7%Additional Action Suggested:  >8%      Allergies as of 07/02/2020   No Known Allergies      Medication List        Accurate as of July 02, 2020 11:59 PM. If you have any questions, ask your nurse or doctor.          STOP taking these medications    Caltrate 600+D Plus Minerals 600-800 MG-UNIT Tabs Stopped by: Elayne Snare, MD       TAKE these medications    amLODipine 10 MG tablet Commonly known as: NORVASC Take 10 mg by mouth daily.   atorvastatin 80 MG tablet Commonly known as: LIPITOR Take 80 mg by mouth daily.   Contour Next Test test strip Generic drug: glucose blood Use as instructed   OneTouch Verio test strip Generic drug: glucose blood Use as instructed to check blood sugar 2 times per day   gabapentin 300 MG capsule Commonly known as: NEURONTIN Take by mouth.   Jardiance 10 MG Tabs tablet Generic drug: empagliflozin TAKE ONE TABLET BY MOUTH ONE TIME DAILY   metFORMIN 500 MG 24 hr tablet Commonly known as: GLUCOPHAGE-XR Take 4 tablets (2,000 mg total) by mouth daily with supper. What changed: additional instructions   metoprolol succinate 100 MG 24 hr tablet Commonly known as: TOPROL-XL Take 100 mg by mouth daily.   multivitamin-iron-minerals-folic acid chewable tablet Chew 1 tablet by mouth daily.   nystatin cream Commonly known as: MYCOSTATIN Apply 1 application topically 2 (two) times daily.   OneTouch Delica Plus [id number] Misc Use to check blood sugar 2 times a day   OneTouch Verio Flex System  w/Device Kit Use to check blood sugar 2 times a day   Ozempic (1 MG/DOSE) 4 MG/3ML Sopn Generic drug: Semaglutide (1 MG/DOSE) inject 40m into the skin once a week   telmisartan-hydrochlorothiazide 80-25 MG tablet Commonly known as: MICARDIS HCT Take 1 tablet by mouth daily.   TTyler AasFlexTouch 100 UNIT/ML FlexTouch Pen Generic drug: insulin degludec inject  0.11ms (24 units) into the skin once a day        Allergies: No Known Allergies  Past Medical History:  Diagnosis Date   Diabetes mellitus    Hyperlipidemia    Hypertension     Past Surgical History:  Procedure Laterality Date   LAPAROSCOPIC GASTRIC BANDING  01/29/2007    No family history on file.  Social History:  reports that he has never smoked. He has never used smokeless tobacco. He reports current alcohol use of about 5.0 standard drinks of alcohol per week. He reports that he does not use drugs.   Review of Systems   Lipid history:  LDL is below 100 Prescribed Lipitor 80 mg from PCP  . Lab Results  Component Value Date   CHOL 135 09/12/2019   CHOL 164 07/10/2019   CHOL 149 10/16/2018   Lab Results  Component Value Date   HDL 48.40 09/12/2019   HDL 37.80 (L) 07/10/2019   HDL 53.10 10/16/2018   Lab Results  Component Value Date   LDLCALC 70 09/12/2019   LDLCALC 63 10/16/2018   [place] 01/15/2018   Lab Results  Component Value Date   TRIG 82.0 09/12/2019   TRIG (H) 07/10/2019    405.0 Triglyceride is over 400; calculations on Lipids are invalid.   TRIG 161.0 (H) 10/16/2018   Lab Results  Component Value Date   CHOLHDL 3 09/12/2019   CHOLHDL 4 07/10/2019   CHOLHDL 3 10/16/2018   Lab Results  Component Value Date   LDLDIRECT 78.0 07/10/2019   LDLDIRECT 71.0 06/20/2016            Hypertension: This is followed by PCP  Is on Micardis 80/25 HCT and amlodipine 10 mg He thinks his blood pressure is higher today because of not sleeping last night well Repeat blood pressure was  145/92  BP Readings from Last 3 Encounters:  07/02/20 (!) 150/92  03/25/20 118/86  12/25/19 124/86    Most recent eye exam was in 1/21, has not scheduled follow-up  Most recent foot exam: 1/21  He has sleep apnea and he says he is waiting for a new machine, not using the CPAP  LABS:  Appointment on 06/29/2020  Component Date Value Ref Range Status   Microalb, Ur 06/29/2020 <0.7  0.0 - 1.9 mg/dL Final   Creatinine,U 06/29/2020 127.6  mg/dL Final   Microalb Creat Ratio 06/29/2020 0.5  0.0 - 30.0 mg/g Final   Fructosamine 06/29/2020 303 (A) 0 - 285 umol/L Final   Comment: Published reference interval for apparently healthy subjects between age 495and 62is 247- 285 umol/L and in a poorly controlled diabetic population is 228 - 563 umol/L with a mean of 396 umol/L.    Sodium 06/29/2020 141  135 - 145 mEq/L Final   Potassium 06/29/2020 3.9  3.5 - 5.1 mEq/L Final   Chloride 06/29/2020 99  96 - 112 mEq/L Final   CO2 06/29/2020 32  19 - 32 mEq/L Final   Glucose, Bld 06/29/2020 158 (A) 70 - 99 mg/dL Final   BUN 06/29/2020 13  6 - 23 mg/dL Final   Creatinine, Ser 06/29/2020 1.07  0.40 - 1.50 mg/dL Final   GFR 06/29/2020 78.61  >60.00 mL/min Final   Calculated using the CKD-EPI Creatinine Equation (2021)   Calcium 06/29/2020 9.6  8.4 - 10.5 mg/dL Final   Hgb A1c MFr Bld 06/29/2020 8.3 (A) 4.6 - 6.5 % Final   Glycemic Control Guidelines for People  with Diabetes:Non Diabetic:  <6%Goal of Therapy: <7%Additional Action Suggested:  >8%      Physical Examination:  BP (!) 150/92 (BP Location: Left Arm, Patient Position: Sitting, Cuff Size: Normal)   Pulse 72   Ht _0  (1.905 m)   Wt 280 lb (127 kg)   SpO2 94%   BMI 35.00 kg/m      ASSESSMENT:  Diabetes type 2, uncontrolled, with BMI 35  See history of present illness for detailed discussion of current diabetes management, blood sugar patterns and problems identified  His A1c is 8.3  A1c is again a little higher than  expected for his home reading Fructosamine is mildly increased at 303  He is on Ozempic 1 mg, Jardiance, Metformin and basal insulin  Has no consistent patterns of blood sugars, lowest reading 124 in the morning but no readings done after meals Previously had refused to consider continuous glucose sensor and this is likely not covered by his insurance also  Recently has not been able to exercise Also diet can be better although he has lost a little weight  Hypertension: Blood pressure is still high but he is on maximum doses of current medications, blood pressure was checked twice today  Likely needs additional medications, currently being managed by his PCP   PLAN:    Discussed importance of checking blood sugars after meals to help identify periods of hypoglycemia and improve his diet Discussed postprandial targets He will take [place] daily at the same time if possible Increase Ozempic to 2 mg New prescription for Jardiance 25 mg will be sent  Start regular exercise 3 or 4 times a week at least More consistent moderation of carbohydrate and high fat meals  He will discuss his hypertension with his PCP, may also benefit from home monitoring  Patient Instructions  Check blood sugars on waking up 3 days a week  Also check blood sugars about 2 hours after meals and do this after different meals by rotation  Recommended blood sugar levels on waking up are 90-130 and about 2 hours after meal is 130-160  Please bring your blood sugar monitor to each visit, thank you  Ozempic 2 shots ot 8m weekly then go to [id number]   Total visit time including counseling = 30 minutes  AjElayne Snare/18/2022, 9:36 PM   Note: This office note was prepared with Dragon voice recognition system technology. Any transcriptional errors that result from this process are unintentional.

## 2020-07-02 NOTE — Patient Instructions (Addendum)
Check blood sugars on waking up 3 days a week  Also check blood sugars about 2 hours after meals and do this after different meals by rotation  Recommended blood sugar levels on waking up are 90-130 and about 2 hours after meal is 130-160  Please bring your blood sugar monitor to each visit, thank you  Ozempic 2 shots ot 1mg  weekly then go to 2mg  Ozempic

## 2020-07-03 ENCOUNTER — Other Ambulatory Visit: Payer: Self-pay | Admitting: Endocrinology

## 2020-07-03 DIAGNOSIS — E1165 Type 2 diabetes mellitus with hyperglycemia: Secondary | ICD-10-CM

## 2020-07-03 MED ORDER — EMPAGLIFLOZIN 25 MG PO TABS
25.0000 mg | ORAL_TABLET | Freq: Every day | ORAL | 2 refills | Status: DC
Start: 2020-07-03 — End: 2020-11-23

## 2020-07-09 ENCOUNTER — Encounter: Payer: Self-pay | Admitting: Orthopedic Surgery

## 2020-07-09 DIAGNOSIS — M25561 Pain in right knee: Secondary | ICD-10-CM

## 2020-07-09 DIAGNOSIS — G8929 Other chronic pain: Secondary | ICD-10-CM | POA: Diagnosis not present

## 2020-07-09 MED ORDER — METHYLPREDNISOLONE ACETATE 40 MG/ML IJ SUSP
40.0000 mg | INTRAMUSCULAR | Status: AC | PRN
  Administered 2020-07-09: 40 mg via INTRA_ARTICULAR

## 2020-07-09 MED ORDER — LIDOCAINE HCL (PF) 1 % IJ SOLN
5.0000 mL | INTRAMUSCULAR | Status: AC | PRN
  Administered 2020-07-09: 5 mL

## 2020-07-09 NOTE — Progress Notes (Signed)
Office Visit Note   Patient: Gregory Brady           Date of Birth: 07/23/1965           MRN: [id number] Visit Date: 06/29/2020              Requested by: Blair Heys, MD [address] [place],  [place] [address] PCP: Blair Heys, MD  Chief Complaint  Patient presents with   Right Knee - Pain      HPI: Patient is a 55 year old gentleman who presents for evaluation of osteoarthritis right knee with symptoms for over 8 months.  Patient states the pain has been worse after increased activities.  Assessment & Plan: Visit Diagnoses:  1. Chronic pain of right knee     Plan: The right knee was injected he tolerated this well reevaluate in 4 weeks.  Follow-Up Instructions: No follow-ups on file.   Ortho Exam  Patient is alert, oriented, no adenopathy, well-dressed, normal affect, normal respiratory effort. Examination patient has a varus right knee he is tender to palpation over the medial joint line collaterals and cruciates are stable.  Patient is diabetic his A1c is approximately 8.  Imaging: No results found. No images are attached to the encounter.  Labs: Lab Results  Component Value Date   HGBA1C 8.3 (H) 06/29/2020   HGBA1C 8.1 (H) 03/18/2020   HGBA1C 8.1 (H) 12/23/2019     Lab Results  Component Value Date   ALBUMIN 4.5 12/23/2019   ALBUMIN 4.5 07/10/2019   ALBUMIN 4.1 04/14/2019    No results found for: MG No results found for: VD25OH  No results found for: PREALBUMIN CBC EXTENDED 02/05/2007 01/30/2007 01/25/2007  WBC 8.9 12.2(H) -  RBC 5.58 4.82 -  HGB 15.2 13.2 15.1  HCT 44.4 38.8(L) 43.9  PLT 285 271 -  NEUTROABS 6.5 10.1(H) -  LYMPHSABS 1.5 1.2 -     There is no height or weight on file to calculate BMI.  Orders:  Orders Placed This Encounter  Procedures   XR Knee 1-2 Views Right   No orders of the defined types were placed in this encounter.    Procedures: Large Joint Inj: R knee on 07/09/2020 2:26  PM Indications: pain and diagnostic evaluation Details: 22 G 1.5 in needle, anteromedial approach  Arthrogram: No  Medications: 5 mL lidocaine (PF) 1 %; 40 mg methylPREDNISolone acetate 40 MG/ML Outcome: tolerated well, no immediate complications Procedure, treatment alternatives, risks and benefits explained, specific risks discussed. Consent was given by the patient. Immediately prior to procedure a time out was called to verify the correct patient, procedure, equipment, support staff and site/side marked as required. Patient was prepped and draped in the usual sterile fashion.     Clinical Data: No additional findings.  ROS:  All other systems negative, except as noted in the HPI. Review of Systems  Objective: Vital Signs: There were no vitals taken for this visit.  Specialty Comments:  No specialty comments available.  PMFS History: Patient Active Problem List   Diagnosis Date Noted   Essential hypertension 11/11/2015   Uncontrolled type 2 diabetes mellitus with complication, without long-term current use of insulin (HCC) 10/04/2015   Morbid obesity (HCC) 03/16/2011   Fitting and adjustment of gastric lap band 03/16/2011   Past Medical History:  Diagnosis Date   Diabetes mellitus    Hyperlipidemia    Hypertension     History reviewed. No pertinent family history.  Past Surgical History:  Procedure Laterality Date   LAPAROSCOPIC GASTRIC BANDING  01/29/2007   Social History   Occupational History   Not on file  Tobacco Use   Smoking status: Never   Smokeless tobacco: Never  Substance and Sexual Activity   Alcohol use: Yes    Alcohol/week: 5.0 standard drinks    Types: 5 Shots of liquor per week   Drug use: No   Sexual activity: Not on file

## 2020-08-02 ENCOUNTER — Ambulatory Visit: Payer: 59 | Admitting: Orthopedic Surgery

## 2020-08-03 ENCOUNTER — Ambulatory Visit: Payer: 59 | Admitting: Physician Assistant

## 2020-08-09 ENCOUNTER — Other Ambulatory Visit: Payer: Self-pay

## 2020-08-09 ENCOUNTER — Ambulatory Visit (INDEPENDENT_AMBULATORY_CARE_PROVIDER_SITE_OTHER): Payer: 59 | Admitting: Orthopedic Surgery

## 2020-08-09 DIAGNOSIS — G8929 Other chronic pain: Secondary | ICD-10-CM

## 2020-08-09 DIAGNOSIS — M25561 Pain in right knee: Secondary | ICD-10-CM | POA: Diagnosis not present

## 2020-08-11 ENCOUNTER — Other Ambulatory Visit: Payer: Self-pay

## 2020-08-11 ENCOUNTER — Encounter: Payer: Self-pay | Admitting: Emergency Medicine

## 2020-08-11 ENCOUNTER — Ambulatory Visit
Admission: EM | Admit: 2020-08-11 | Discharge: 2020-08-11 | Disposition: A | Discharge status: Home / Self Care | Payer: 59 | Attending: Emergency Medicine | Admitting: Emergency Medicine

## 2020-08-11 DIAGNOSIS — R197 Diarrhea, unspecified: Secondary | ICD-10-CM | POA: Insufficient documentation

## 2020-08-11 LAB — C DIFFICILE QUICK SCREEN W PCR REFLEX
C Diff antigen: NEGATIVE
C Diff interpretation: NOT DETECTED
C Diff toxin: NEGATIVE

## 2020-08-11 MED ORDER — DIPHENOXYLATE-ATROPINE 2.5-0.025 MG PO TABS: 1.0000 | ORAL_TABLET | Freq: Four times a day (QID) | ORAL | 0 refills | Status: DC | PRN

## 2020-08-11 NOTE — ED Provider Notes (Signed)
[facility]    CSN: [id number] Arrival date & time: 08/11/20  1010      History   Chief Complaint Chief Complaint  Patient presents with   Diarrhea    HPI Gregory Brady is a 55 y.o. male history of hypertension, DM type II presenting today for evaluation of diarrhea.  Reports diarrhea over the past week.  Initially thought was throat food poisoning but symptoms have persisted.  Reports continued watery stools up to 8 times per day.  Denies blood in stool.  Denies associated abdominal pain.  Denies nausea or vomiting.  Denies fevers chills or body aches.  Denies lightheadedness or dizziness.  Has had prior lap band surgery, but denies other GI problems.  HPI  Past Medical History:  Diagnosis Date   Diabetes mellitus    Hyperlipidemia    Hypertension     Patient Active Problem List   Diagnosis Date Noted   Essential hypertension 11/11/2015   Uncontrolled type 2 diabetes mellitus with complication, without long-term current use of insulin ([place]) 10/04/2015   Morbid obesity ([place]) 03/16/2011   Fitting and adjustment of gastric lap band 03/16/2011    Past Surgical History:  Procedure Laterality Date   LAPAROSCOPIC GASTRIC BANDING  01/29/2007       Home Medications    Prior to Admission medications   Medication Sig Start Date End Date Taking? Authorizing Provider  diphenoxylate-atropine (LOMOTIL) 2.5-0.025 MG tablet Take 1 tablet by mouth 4 (four) times daily as needed for diarrhea or loose stools. 08/11/20  Yes Weda Baumgarner C, PA-C  amLODipine (NORVASC) 10 MG tablet Take 10 mg by mouth daily.    [provider]  atorvastatin (LIPITOR) 80 MG tablet Take 80 mg by mouth daily.  09/21/15   [provider]  Blood Glucose Monitoring Suppl ([place]) w/Device KIT Use to check blood sugar 2 times a day 12/31/19   Elayne Snare, MD  empagliflozin (JARDIANCE) 25 MG TABS tablet Take 1 tablet (25 mg total) by mouth daily before breakfast.  07/03/20   Elayne Snare, MD  gabapentin (NEURONTIN) 300 MG capsule Take by mouth. Patient not taking: No sig reported 07/10/19   [provider]  glucose blood (CONTOUR NEXT TEST) test strip Use as instructed 12/17/19   Elayne Snare, MD  glucose blood (ONETOUCH VERIO) test strip Use as instructed to check blood sugar 2 times per day 12/29/19   Elayne Snare, MD  Lancets (ONETOUCH DELICA PLUS [id number]) [place] Use to check blood sugar 2 times a day 12/29/19   Elayne Snare, MD  metFORMIN (GLUCOPHAGE-XR) 500 MG 24 hr tablet Take 4 tablets (2,000 mg total) by mouth daily with supper. Patient taking differently: Take 2,000 mg by mouth daily with supper. Takes in the morning 11/11/15   Elayne Snare, MD  metoprolol succinate (TOPROL-XL) 100 MG 24 hr tablet Take 100 mg by mouth daily. 07/10/19   [provider]  multivitamin-iron-minerals-folic acid (CENTRUM) chewable tablet Chew 1 tablet by mouth daily.    [provider]  nystatin cream (MYCOSTATIN) Apply 1 application topically 2 (two) times daily. 05/13/19   [provider]  OZEMPIC, 1 MG/DOSE, 4 MG/3ML SOPN inject 24m into the skin once a week 05/31/20   KElayne Snare MD  telmisartan-hydrochlorothiazide (MICARDIS HCT) 80-25 MG tablet Take 1 tablet by mouth daily. 06/05/19   [provider]  TRESIBA FLEXTOUCH 100 UNIT/ML FlexTouch Pen inject 0.244m (24 units) into the skin once a day 03/26/20   KuDwyane Dee  Vicenta Aly, MD    Family History History reviewed. No pertinent family history.  Social History Social History   Tobacco Use   Smoking status: Never   Smokeless tobacco: Never  Substance Use Topics   Alcohol use: Yes    Alcohol/week: 5.0 standard drinks    Types: 5 Shots of liquor per week   Drug use: No     Allergies   Patient has no known allergies.   Review of Systems Review of Systems  Constitutional:  Negative for fever.  HENT:  Negative for sore throat.   Respiratory:  Negative for shortness of breath.    Cardiovascular:  Negative for chest pain.  Gastrointestinal:  Positive for diarrhea. Negative for abdominal pain, nausea and vomiting.  Genitourinary:  Negative for difficulty urinating, dysuria, frequency, penile discharge, penile pain, penile swelling, scrotal swelling and testicular pain.  Skin:  Negative for rash.  Neurological:  Negative for dizziness, light-headedness and headaches.    Physical Exam Triage Vital Signs ED Triage Vitals  Enc Vitals Group     BP 08/11/20 1029 112/76     Pulse Rate 08/11/20 1029 80     Resp 08/11/20 1029 17     Temp 08/11/20 1029 99.1 F (37.3 C)     Temp Source 08/11/20 1029 Oral     SpO2 08/11/20 1029 96 %     Weight --      Height --      Head Circumference --      Peak Flow --      Pain Score 08/11/20 1028 0     Pain Loc --      Pain Edu? --      Excl. in [place]? --    No data found.  Updated Vital Signs BP 112/76   Pulse 80   Temp 99.1 F (37.3 C) (Oral)   Resp 17   SpO2 96%   Visual Acuity Right Eye Distance:   Left Eye Distance:   Bilateral Distance:    Right Eye Near:   Left Eye Near:    Bilateral Near:     Physical Exam Vitals and nursing note reviewed.  Constitutional:      Appearance: He is well-developed.     Comments: No acute distress  HENT:     Head: Normocephalic and atraumatic.     Nose: Nose normal.  Eyes:     Conjunctiva/sclera: Conjunctivae normal.  Cardiovascular:     Rate and Rhythm: Normal rate.  Pulmonary:     Effort: Pulmonary effort is normal. No respiratory distress.  Abdominal:     General: There is no distension.     Comments: Soft, nondistended, well-healed prior surgical scars present in right upper quadrant, palpable firm area near surgical scar in right upper quadrant, otherwise  nontender to light and deep palpation  Musculoskeletal:        General: Normal range of motion.     Cervical back: Neck supple.  Skin:    General: Skin is warm and dry.  Neurological:     Mental Status: He  is alert and oriented to person, place, and time.     UC Treatments / Results  Labs (all labs ordered are listed, but only abnormal results are displayed) Labs Reviewed  GASTROINTESTINAL PANEL BY PCR, STOOL (REPLACES STOOL CULTURE)    EKG   Radiology No results found.  Procedures Procedures (including critical care time)  Medications Ordered in UC Medications - No data to display  Initial Impression /  Assessment and Plan / UC Course  I have reviewed the triage vital signs and the nursing notes.  Pertinent labs & imaging results that were available during my care of the patient were reviewed by me and considered in my medical decision making (see chart for details).     Diarrhea x1 week-tolerating oral intake, vital signs stable, will obtain stool sample to send off for GI pathogen panel.  Recommended to continue probiotics, will add in Lomotil as alternative to Imodium and push fluids.  If negative may consider blood work/gastroenterology referral if continuing to be persistent for evaluation for possible functional causes of diarrhea.  Discussed strict return precautions. Patient verbalized understanding and is agreeable with plan.  Final Clinical Impressions(s) / UC Diagnoses   Final diagnoses:  Diarrhea, unspecified type     Discharge Instructions      Stool sample pending-I will call with results if abnormal Drink plenty of water and fluids Continue probiotics-daily tablets or activity a yogurt May try Lomotil for diarrhea      ED Prescriptions     Medication Sig Dispense Auth. Provider   diphenoxylate-atropine (LOMOTIL) 2.5-0.025 MG tablet Take 1 tablet by mouth 4 (four) times daily as needed for diarrhea or loose stools. 15 tablet Shanitha Twining, [place] C, PA-C      PDMP not reviewed this encounter.   Janith Lima, PA-C 08/11/20 1102

## 2020-08-11 NOTE — Discharge Instructions (Addendum)
Stool sample pending-I will call with results if abnormal Drink plenty of water and fluids Continue probiotics-daily tablets or activity a yogurt May try Lomotil for diarrhea

## 2020-08-12 ENCOUNTER — Telehealth: Payer: Self-pay | Admitting: Emergency Medicine

## 2020-08-12 LAB — GASTROINTESTINAL PANEL BY PCR, STOOL (REPLACES STOOL CULTURE)

## 2020-08-12 MED ORDER — AZITHROMYCIN 250 MG PO TABS: 1000.0000 mg | ORAL_TABLET | Freq: Once | ORAL | 0 refills | Status: AC

## 2020-08-12 NOTE — Telephone Encounter (Signed)
Per Hallie, APP, patient to be treated for positive e.coli with Azithromycin.   Reviewed with patient, verified pharmacy, prescription sent

## 2020-08-17 ENCOUNTER — Encounter: Payer: Self-pay | Admitting: Orthopedic Surgery

## 2020-08-17 NOTE — Progress Notes (Signed)
Office Visit Note   Patient: Gregory Brady           Date of Birth: February 07, 1965           MRN: [id number] Visit Date: 08/09/2020              Requested by: Blair Heys, MD [address] [place],  [place] [address] PCP: Blair Heys, MD  Chief Complaint  Patient presents with   Right Knee - Pain      HPI: Patient is a 55 year old gentleman who is seen in follow-up for right knee pain.  His previous steroid injection was on June 29, 2020 which did help.  Patient states he still has pain and has been using Aleve.  Patient states he cannot ride his motorcycle secondary to his knee pain.  Assessment & Plan: Visit Diagnoses:  1. Chronic pain of right knee     Plan: We will request authorization for hyaluronic acid injection.  Follow-up after this is approved.  Follow-Up Instructions: Return if symptoms worsen or fail to improve.   Ortho Exam  Patient is alert, oriented, no adenopathy, well-dressed, normal affect, normal respiratory effort. Examination patient has pain to palpation of the medial joint line flexion and rotation does not reproduce any mechanical symptoms.  There is no effusion there is no pain to palpation of the patellofemoral joint.  Patient also has a pronated valgus right foot, he can do a single limb heel raise, patient is currently using good feet inserts and recommended that he could try sole orthotics to help with the pes planus.  Imaging: No results found. No images are attached to the encounter.  Labs: Lab Results  Component Value Date   HGBA1C 8.3 (H) 06/29/2020   HGBA1C 8.1 (H) 03/18/2020   HGBA1C 8.1 (H) 12/23/2019     Lab Results  Component Value Date   ALBUMIN 4.5 12/23/2019   ALBUMIN 4.5 07/10/2019   ALBUMIN 4.1 04/14/2019    No results found for: MG No results found for: VD25OH  No results found for: PREALBUMIN CBC EXTENDED 02/05/2007 01/30/2007 01/25/2007  WBC 8.9 12.2(H) -  RBC 5.58 4.82 -  HGB 15.2 13.2 15.1   HCT 44.4 38.8(L) 43.9  PLT 285 271 -  NEUTROABS 6.5 10.1(H) -  LYMPHSABS 1.5 1.2 -     There is no height or weight on file to calculate BMI.  Orders:  No orders of the defined types were placed in this encounter.  No orders of the defined types were placed in this encounter.    Procedures: No procedures performed  Clinical Data: No additional findings.  ROS:  All other systems negative, except as noted in the HPI. Review of Systems  Objective: Vital Signs: There were no vitals taken for this visit.  Specialty Comments:  No specialty comments available.  PMFS History: Patient Active Problem List   Diagnosis Date Noted   Essential hypertension 11/11/2015   Uncontrolled type 2 diabetes mellitus with complication, without long-term current use of insulin (HCC) 10/04/2015   Morbid obesity (HCC) 03/16/2011   Fitting and adjustment of gastric lap band 03/16/2011   Past Medical History:  Diagnosis Date   Diabetes mellitus    Hyperlipidemia    Hypertension     History reviewed. No pertinent family history.  Past Surgical History:  Procedure Laterality Date   LAPAROSCOPIC GASTRIC BANDING  01/29/2007   Social History   Occupational History   Not on file  Tobacco Use  Smoking status: Never   Smokeless tobacco: Never  Substance and Sexual Activity   Alcohol use: Yes    Alcohol/week: 5.0 standard drinks    Types: 5 Shots of liquor per week   Drug use: No   Sexual activity: Not on file

## 2020-09-03 ENCOUNTER — Other Ambulatory Visit: Payer: Self-pay | Admitting: Endocrinology

## 2020-09-16 ENCOUNTER — Other Ambulatory Visit: Payer: Self-pay | Admitting: Endocrinology

## 2020-09-27 ENCOUNTER — Other Ambulatory Visit: Payer: Self-pay | Admitting: Endocrinology

## 2020-09-29 ENCOUNTER — Other Ambulatory Visit: Payer: Self-pay

## 2020-09-29 ENCOUNTER — Other Ambulatory Visit (INDEPENDENT_AMBULATORY_CARE_PROVIDER_SITE_OTHER): Payer: 59

## 2020-09-29 DIAGNOSIS — Z794 Long term (current) use of insulin: Secondary | ICD-10-CM

## 2020-09-29 DIAGNOSIS — E1165 Type 2 diabetes mellitus with hyperglycemia: Secondary | ICD-10-CM

## 2020-09-29 LAB — BASIC METABOLIC PANEL
BUN: 9 mg/dL (ref 6–23)
CO2: 32 mEq/L (ref 19–32)
Calcium: 9.8 mg/dL (ref 8.4–10.5)
Chloride: 101 mEq/L (ref 96–112)
Creatinine, Ser: 0.94 mg/dL (ref 0.40–1.50)
GFR: 91.67 mL/min (ref >60.00)
Glucose, Bld: 148 mg/dL — ABNORMAL HIGH (ref 70–99)
Potassium: 4.2 mEq/L (ref 3.5–5.1)
Sodium: 143 mEq/L (ref 135–145)

## 2020-09-29 LAB — HEMOGLOBIN A1C: Hgb A1c MFr Bld: 8.1 % — ABNORMAL HIGH (ref 4.6–6.5)

## 2020-10-01 ENCOUNTER — Other Ambulatory Visit: Payer: 59

## 2020-10-07 ENCOUNTER — Ambulatory Visit: Payer: 59 | Admitting: Endocrinology

## 2020-10-19 ENCOUNTER — Ambulatory Visit: Payer: 59 | Admitting: Endocrinology

## 2020-10-19 ENCOUNTER — Ambulatory Visit (INDEPENDENT_AMBULATORY_CARE_PROVIDER_SITE_OTHER): Payer: 59 | Admitting: Endocrinology

## 2020-10-19 ENCOUNTER — Other Ambulatory Visit: Payer: Self-pay

## 2020-10-19 ENCOUNTER — Encounter: Payer: Self-pay | Admitting: Endocrinology

## 2020-10-19 VITALS — BP 132/72 | HR 87 | Ht 75.5 in | Wt 277.0 lb

## 2020-10-19 DIAGNOSIS — Z794 Long term (current) use of insulin: Secondary | ICD-10-CM

## 2020-10-19 DIAGNOSIS — E1165 Type 2 diabetes mellitus with hyperglycemia: Secondary | ICD-10-CM | POA: Diagnosis not present

## 2020-10-19 MED ORDER — OZEMPIC (2 MG/DOSE) 8 MG/3ML ~~LOC~~ SOPN: PEN_INJECTOR | SUBCUTANEOUS | 1 refills | Status: DC

## 2020-10-19 NOTE — Patient Instructions (Signed)
Check blood sugars on waking up 1-2  days a week  Also check blood sugars about 2 hours after meals and do this after different meals by rotation  Recommended blood sugar levels on waking up are 90-130 and about 2 hours after meal is 130-160  Please bring your blood sugar monitor to each visit, thank you  

## 2020-10-19 NOTE — Progress Notes (Signed)
Reason for Appointment: Follow-up for Type 2 Diabetes  Referring physician: Ehinger   History of Present Illness:          Date of diagnosis of type 2 diabetes mellitus:?  1997        Background history:   He was symptomatic at diagnosis with increased urination He has been mostly treated with metformin and glipizide in the past He was taking Januvia more recently but this was changed in 2017 2 Onglyza because of insurance preference He does not think his sugars have been consistently controlled in the past, best A1c recently appears to be in 05/2014 which was 7.5 but subsequently has been at least 8%  Recent history:   TRESIBA insulin: 24 units daily  Non-insulin hypoglycemic drugs: Metformin ER 500, 4 tablets in a.m., Jardiance 20  mg , Ozempic 2 mg weekly   A1c has been consistently high, now 8.1   Fructosamine previously 281  Current management, blood sugar patterns and problems identified: He has gained checked his blood sugar only 5 times in the last month and only in the mornings  Again has difficulty losing weight although his weight is down 3 pounds since the last visit  Highest blood sugar this morning was related likely to eating past and chips last night  He was told to let us know when he finished the 1 mg Ozempic to increase the dose but he forgot, however he has taken double doses the last 2 Sundays He forgets to check his readings after meals despite reminders  Also was told to make sure he takes [place] at the same time daily  No recent side effects from Jardiance 25 mg He has exercised since his last visit but for some reason has not done any in the last 3 weeks, goes to [employer]       Side effects from medications have been: Periodic balanitis from Jardiance  Compliance with the medical regimen: Inconsistent  Glucose monitoring: Using contour meter   PRE-MEAL Fasting Lunch Dinner Bedtime Overall  Glucose range:  145   110-168   Mean/median: 125    138   POST-MEAL PC Breakfast PC Lunch PC Dinner  Glucose range:   Question  Mean/median:       Previous range 124-174, most glucose readings before 9 AM  OVERALL average 140  Self-care: The diet that the patient has been following is: tries to limit fast food usually .     Meal times are:  Breakfast is at 9 AM-12 pm Lunch: 2-4 PM Dinner:  6-8 PM              Dietician visit, most recent: 9/17  Weight history:   He had a gastric band in 2009 but this was loosened a couple of years ago because with taking metformin he would get uncomfortably bloated and has gained weight  Wt Readings from Last 3 Encounters:  10/19/20 277 lb (125.6 kg)  07/02/20 280 lb (127 kg)  03/25/20 286 lb (129.7 kg)    Glycemic control:    Lab Results  Component Value Date   HGBA1C 8.1 (H) 09/29/2020   HGBA1C 8.3 (H) 06/29/2020   HGBA1C 8.1 (H) 03/18/2020   Lab Results  Component Value Date   MICROALBUR <0.7 06/29/2020   LDLCALC 70 09/12/2019   CREATININE 0.94 09/29/2020   Lab Results  Component Value Date   MICRALBCREAT 0.5 06/29/2020    No visits with  results within 1 Week(s) from this visit.  Latest known visit with results is:  Lab on 09/29/2020  Component Date Value Ref Range Status   Sodium 09/29/2020 143  135 - 145 mEq/L Final   Potassium 09/29/2020 4.2  3.5 - 5.1 mEq/L Final   Chloride 09/29/2020 101  96 - 112 mEq/L Final   CO2 09/29/2020 32  19 - 32 mEq/L Final   Glucose, Bld 09/29/2020 148 (A) 70 - 99 mg/dL Final   BUN 09/29/2020 9  6 - 23 mg/dL Final   Creatinine, Ser 09/29/2020 0.94  0.40 - 1.50 mg/dL Final   GFR 09/29/2020 91.67  >60.00 mL/min Final   Calculated using the CKD-EPI Creatinine Equation (2021)   Calcium 09/29/2020 9.8  8.4 - 10.5 mg/dL Final   Hgb A1c MFr Bld 09/29/2020 8.1 (A) 4.6 - 6.5 % Final   Glycemic Control Guidelines for People with Diabetes:Non Diabetic:  <6%Goal of Therapy: <7%Additional Action Suggested:  >8%      Allergies  as of 10/19/2020   No Known Allergies      Medication List        Accurate as of October 19, 2020 11:19 AM. If you have any questions, ask your nurse or doctor.          amLODipine 10 MG tablet Commonly known as: NORVASC Take 10 mg by mouth daily.   atorvastatin 80 MG tablet Commonly known as: LIPITOR Take 80 mg by mouth daily.   Contour Next Test test strip Generic drug: glucose blood Use as instructed   OneTouch Verio test strip Generic drug: glucose blood Use as instructed to check blood sugar 2 times per day   diphenoxylate-atropine 2.5-0.025 MG tablet Commonly known as: Lomotil Take 1 tablet by mouth 4 (four) times daily as needed for diarrhea or loose stools.   empagliflozin 25 MG Tabs tablet Commonly known as: Jardiance Take 1 tablet (25 mg total) by mouth daily before breakfast.   gabapentin 300 MG capsule Commonly known as: NEURONTIN Take by mouth.   metFORMIN 500 MG 24 hr tablet Commonly known as: GLUCOPHAGE-XR Take 4 tablets (2,000 mg total) by mouth daily with supper. What changed: additional instructions   metoprolol succinate 100 MG 24 hr tablet Commonly known as: TOPROL-XL Take 100 mg by mouth daily.   multivitamin-iron-minerals-folic acid chewable tablet Chew 1 tablet by mouth daily.   nystatin cream Commonly known as: MYCOSTATIN Apply 1 application topically 2 (two) times daily.   OneTouch Delica Plus [id number] Misc Use to check blood sugar 2 times a day   OneTouch Verio Flex System w/Device Kit Use to check blood sugar 2 times a day   Ozempic (1 MG/DOSE) 4 MG/3ML Sopn Generic drug: Semaglutide (1 MG/DOSE) INJECT THE CONTENTS OF ONE PEN (1 MG) INTO THE SKIN ONCE A WEEK   telmisartan-hydrochlorothiazide 80-25 MG tablet Commonly known as: MICARDIS HCT Take 1 tablet by mouth daily.   Tyler Aas FlexTouch 100 UNIT/ML FlexTouch Pen Generic drug: insulin degludec inject 0.74ms (24 units) into the skin once a day        Allergies:  No Known Allergies  Past Medical History:  Diagnosis Date   Diabetes mellitus    Hyperlipidemia    Hypertension     Past Surgical History:  Procedure Laterality Date   LAPAROSCOPIC GASTRIC BANDING  01/29/2007    No family history on file.  Social History:  reports that he has never smoked. He has never used smokeless tobacco. He reports current alcohol use of  about 5.0 standard drinks per week. He reports that he does not use drugs.   Review of Systems   Lipid history:  LDL is below 100 Prescribed Lipitor 80 mg from PCP  . Lab Results  Component Value Date   CHOL 135 09/12/2019   CHOL 164 07/10/2019   CHOL 149 10/16/2018   Lab Results  Component Value Date   HDL 48.40 09/12/2019   HDL 37.80 (L) 07/10/2019   HDL 53.10 10/16/2018   Lab Results  Component Value Date   LDLCALC 70 09/12/2019   LDLCALC 63 10/16/2018   [place] 85 01/15/2018   Lab Results  Component Value Date   TRIG 82.0 09/12/2019   TRIG (H) 07/10/2019    405.0 Triglyceride is over 400; calculations on Lipids are invalid.   TRIG 161.0 (H) 10/16/2018   Lab Results  Component Value Date   CHOLHDL 3 09/12/2019   CHOLHDL 4 07/10/2019   CHOLHDL 3 10/16/2018   Lab Results  Component Value Date   LDLDIRECT 78.0 07/10/2019   LDLDIRECT 71.0 06/20/2016            Hypertension: This is followed by PCP  Is on Micardis 80/25 HCT and amlodipine 10 mg  BP Readings from Last 3 Encounters:  10/19/20 132/72  08/11/20 112/76  07/02/20 (!) 150/92    Most recent eye exam was in 1/22, has not scheduled follow-up  Most recent foot exam: 1/21  He has sleep apnea and he says he is waiting for a new machine, .using the CPAP  LABS:  No visits with results within 1 Week(s) from this visit.  Latest known visit with results is:  Lab on 09/29/2020  Component Date Value Ref Range Status   Sodium 09/29/2020 143  135 - 145 mEq/L Final   Potassium 09/29/2020 4.2  3.5 - 5.1 mEq/L Final   Chloride  09/29/2020 101  96 - 112 mEq/L Final   CO2 09/29/2020 32  19 - 32 mEq/L Final   Glucose, Bld 09/29/2020 148 (A) 70 - 99 mg/dL Final   BUN 09/29/2020 9  6 - 23 mg/dL Final   Creatinine, Ser 09/29/2020 0.94  0.40 - 1.50 mg/dL Final   GFR 09/29/2020 91.67  >60.00 mL/min Final   Calculated using the CKD-EPI Creatinine Equation (2021)   Calcium 09/29/2020 9.8  8.4 - 10.5 mg/dL Final   Hgb A1c MFr Bld 09/29/2020 8.1 (A) 4.6 - 6.5 % Final   Glycemic Control Guidelines for People with Diabetes:Non Diabetic:  <6%Goal of Therapy: <7%Additional Action Suggested:  >8%      Physical Examination:  BP 132/72   Pulse 87   Ht 6' 3.5" (1.918 m)   Wt 277 lb (125.6 kg)   SpO2 98%   BMI 34.17 kg/m      ASSESSMENT:  Diabetes type 2, uncontrolled, with BMI 34  See history of present illness for detailed discussion of current diabetes management, blood sugar patterns and problems identified  His A1c is 8.1  A1c is likely high because of postprandial readings which he does not monitor  Fasting readings are averaging 125 with highest reading 168 recently, monitoring is infrequent  He is on Ozempic 1 mg, Jardiance, Metformin and basal insulin  Have discussed getting the continuous glucose sensor and this is likely not covered by his insurance   Although he has started exercise he has done this intermittently  Hypertension: Blood pressure is proved    PLAN:    Reminded him about checking blood sugars  after meals to help identify periods of high sugars and what foods make the sugars go up Also discussed that if he has high readings after meals consistently may need mealtime insulin In the meantime will see if he is able to get 2 mg Ozempic which he has tried only recently Otherwise consider MOUNJARO  Showed him the injection device for this and consider sending the prescription for 5 mg if he is not able to get the 2 mg Ozempic available at the pharmacy  He will take [place] daily at the  same time if possible   Start regular exercise 3 or 4 times a week and keep regular regimen  Follow-up in 3 months  There are no Patient Instructions on file for this visit.     Elayne Snare 10/19/2020, 11:19 AM   Note: This office note was prepared with Dragon voice recognition system technology. Any transcriptional errors that result from this process are unintentional.

## 2020-10-24 ENCOUNTER — Other Ambulatory Visit: Payer: Self-pay | Admitting: Endocrinology

## 2020-11-08 ENCOUNTER — Other Ambulatory Visit: Payer: Self-pay | Admitting: Endocrinology

## 2020-11-10 ENCOUNTER — Telehealth: Payer: Self-pay | Admitting: Endocrinology

## 2020-11-10 DIAGNOSIS — E1165 Type 2 diabetes mellitus with hyperglycemia: Secondary | ICD-10-CM

## 2020-11-10 DIAGNOSIS — Z794 Long term (current) use of insulin: Secondary | ICD-10-CM

## 2020-11-10 NOTE — Telephone Encounter (Signed)
Pt calling in for refill of Ozempic 1mg , since 2mg  is .   [id number] - [place], [place] - [address]  Also, inquiring about Mounjaro if it's a good back up plan.   Pt contact [phone]

## 2020-11-15 ENCOUNTER — Other Ambulatory Visit: Payer: Self-pay | Admitting: Endocrinology

## 2020-11-15 DIAGNOSIS — E1165 Type 2 diabetes mellitus with hyperglycemia: Secondary | ICD-10-CM

## 2020-11-15 DIAGNOSIS — Z794 Long term (current) use of insulin: Secondary | ICD-10-CM

## 2020-11-15 MED ORDER — TIRZEPATIDE 5 MG/0.5ML ~~LOC~~ SOAJ: 5.0000 mg | SUBCUTANEOUS | 0 refills | Status: DC

## 2020-11-15 NOTE — Telephone Encounter (Signed)
Ok to switch from 2 MG dose to 1 MG dose until pharmacy has it in stock? Pt suggesting Mounjaro. Please advise.

## 2020-11-15 NOTE — Addendum Note (Signed)
Addended by: Kenyon Ana on: 11/15/2020 04:20 PM   Modules accepted: Orders

## 2020-11-15 NOTE — Telephone Encounter (Signed)
Rx sent to preferred pharmacy. Pt advised via mychart.

## 2020-11-22 ENCOUNTER — Other Ambulatory Visit: Payer: Self-pay | Admitting: Endocrinology

## 2020-11-22 NOTE — Telephone Encounter (Signed)
Called patient to confirm he received the mychart message sent to him a week ago about the mounjaro. He did receive it and will use copay card to get medication

## 2020-12-19 ENCOUNTER — Other Ambulatory Visit: Payer: Self-pay | Admitting: Endocrinology

## 2020-12-20 ENCOUNTER — Telehealth: Payer: Self-pay | Admitting: Endocrinology

## 2020-12-20 ENCOUNTER — Telehealth: Payer: Self-pay | Admitting: Pharmacy Technician

## 2020-12-20 DIAGNOSIS — E1165 Type 2 diabetes mellitus with hyperglycemia: Secondary | ICD-10-CM

## 2020-12-20 MED ORDER — TIRZEPATIDE 5 MG/0.5ML ~~LOC~~ SOAJ: 5.0000 mg | SUBCUTANEOUS | 0 refills | Status: DC

## 2020-12-20 NOTE — Telephone Encounter (Signed)
Called patient to verify which rx we are transferring. Verified the transfer of Mounjaro 5MG  to . Patient informed as well

## 2020-12-20 NOTE — Telephone Encounter (Signed)
Patient Advocate Encounter  Received notification from [employer] [facility]) that prior authorization for [facility] 5MG  is required.   PA submitted on 12.5.22 Key [id number] Status is pending   [facility] will continue to follow  14.5.22, CPhT Patient Advocate [facility] Phone: [phone] Fax:  [phone]

## 2020-12-20 NOTE — Telephone Encounter (Signed)
Pt called for transfer of prescription to be sent to: [facility] [id number] [place] - [address] Phone:  [phone]  Fax:  [phone]

## 2020-12-24 ENCOUNTER — Other Ambulatory Visit (HOSPITAL_COMMUNITY): Payer: Self-pay

## 2020-12-24 NOTE — Telephone Encounter (Signed)
Received notification from [employer] [facility]) regarding a prior authorization for [facility] 5MG . Authorization has been APPROVED from 12.9.22 to 12.8.23.   Per test claim, copay for 28 days supply is $25  Authorization # [id number]

## 2021-01-10 ENCOUNTER — Other Ambulatory Visit: Payer: Self-pay | Admitting: Endocrinology

## 2021-01-13 ENCOUNTER — Other Ambulatory Visit: Payer: Self-pay | Admitting: Internal Medicine

## 2021-01-13 DIAGNOSIS — E1165 Type 2 diabetes mellitus with hyperglycemia: Secondary | ICD-10-CM

## 2021-01-18 ENCOUNTER — Other Ambulatory Visit (INDEPENDENT_AMBULATORY_CARE_PROVIDER_SITE_OTHER): Payer: Managed Care, Other (non HMO)

## 2021-01-18 ENCOUNTER — Other Ambulatory Visit: Payer: Self-pay

## 2021-01-18 DIAGNOSIS — E1165 Type 2 diabetes mellitus with hyperglycemia: Secondary | ICD-10-CM | POA: Diagnosis not present

## 2021-01-18 DIAGNOSIS — Z794 Long term (current) use of insulin: Secondary | ICD-10-CM

## 2021-01-18 LAB — HEMOGLOBIN A1C: Hgb A1c MFr Bld: 8.3 % — ABNORMAL HIGH (ref 4.6–6.5)

## 2021-01-18 LAB — BASIC METABOLIC PANEL
BUN: 14 mg/dL (ref 6–23)
CO2: 32 mEq/L (ref 19–32)
Calcium: 9.6 mg/dL (ref 8.4–10.5)
Chloride: 97 mEq/L (ref 96–112)
Creatinine, Ser: 1.15 mg/dL (ref 0.40–1.50)
GFR: 71.82 mL/min (ref >60.00)
Glucose, Bld: 150 mg/dL — ABNORMAL HIGH (ref 70–99)
Potassium: 3.7 mEq/L (ref 3.5–5.1)
Sodium: 138 mEq/L (ref 135–145)

## 2021-01-24 ENCOUNTER — Ambulatory Visit (INDEPENDENT_AMBULATORY_CARE_PROVIDER_SITE_OTHER): Payer: Managed Care, Other (non HMO) | Admitting: Endocrinology

## 2021-01-24 ENCOUNTER — Encounter: Payer: Self-pay | Admitting: Endocrinology

## 2021-01-24 ENCOUNTER — Other Ambulatory Visit: Payer: Self-pay

## 2021-01-24 VITALS — BP 122/80 | HR 78 | Ht 75.5 in | Wt 277.4 lb

## 2021-01-24 DIAGNOSIS — E1165 Type 2 diabetes mellitus with hyperglycemia: Secondary | ICD-10-CM | POA: Diagnosis not present

## 2021-01-24 DIAGNOSIS — Z794 Long term (current) use of insulin: Secondary | ICD-10-CM

## 2021-01-24 DIAGNOSIS — I10 Essential (primary) hypertension: Secondary | ICD-10-CM

## 2021-01-24 DIAGNOSIS — E782 Mixed hyperlipidemia: Secondary | ICD-10-CM

## 2021-01-24 MED ORDER — TIRZEPATIDE 7.5 MG/0.5ML ~~LOC~~ SOAJ: 7.5000 mg | SUBCUTANEOUS | 2 refills | Status: DC

## 2021-01-24 MED ORDER — TIRZEPATIDE 5 MG/0.5ML ~~LOC~~ SOAJ: 5.0000 mg | SUBCUTANEOUS | 0 refills | Status: DC

## 2021-01-24 NOTE — Patient Instructions (Addendum)
Check blood sugars on waking up 3 days a week  Also check blood sugars about 2 hours after meals and do this after different meals by rotation  Recommended blood sugar levels on waking up are 90-130 and about 2 hours after meal is 130-160  Please bring your blood sugar monitor to each visit, thank you  Mounjaro 5mg  for 4 weeks then 7.5  Restart exercise

## 2021-01-24 NOTE — Progress Notes (Signed)
Reason for Appointment: Follow-up for Type 2 Diabetes  Referring physician: Ehinger   History of Present Illness:          Date of diagnosis of type 2 diabetes mellitus:?  1997        Background history:   He was symptomatic at diagnosis with increased urination He has been mostly treated with metformin and glipizide in the past He was taking Januvia more recently but this was changed in 2017 2 Onglyza because of insurance preference He does not think his sugars have been consistently controlled in the past, best A1c recently appears to be in 05/2014 which was 7.5 but subsequently has been at least 8%  Recent history:   TRESIBA insulin: 24 units daily  Non-insulin hypoglycemic drugs: Metformin ER 500, 4 tablets in a.m., Jardiance 25 mg,    A1c has been consistently high, now 8.3  Fructosamine previously 281  Current management, blood sugar patterns and problems identified: He has not been able to achieve any better control Because of difficulty getting the 2 mg Ozempic he was tried on Mounjaro after his last visit However he could not get the 7.5 mg dose which was prescribed subsequently He says he has not taken any GLP-1 drugs for the last month and has not also called [place] or checked on the status of Ozempic He has not done consistently well with his diet and he thinks he may be getting too many snacks at night Apparently he has taken his [place] consistently daily No side effects from Jardiance 25 mg He has not exercised since his last visit, goes to [employer] otherwise       Side effects from medications have been: Periodic balanitis from Jardiance  Compliance with the medical regimen: Inconsistent  Glucose monitoring: One Touch Verio  Recent blood sugars: 136-164 fasting with only 3 readings  Previously:  PRE-MEAL Fasting Lunch Dinner Bedtime Overall  Glucose range:  145   110-168  Mean/median: 125    138   POST-MEAL PC Breakfast PC Lunch PC  Dinner  Glucose range:   ?  Mean/median:      Self-care: The diet that the patient has been following is: tries to limit fast food usually .      Meal times are:  Breakfast is at 9 AM-12 pm Lunch: 2-4 PM Dinner:  6-8 PM              Dietician visit, most recent: 9/17  Weight history:   He had a gastric band in 2009 but this was loosened a couple of years ago because with taking metformin he would get uncomfortably bloated and has gained weight  Wt Readings from Last 3 Encounters:  01/24/21 277 lb 6.4 oz (125.8 kg)  10/19/20 277 lb (125.6 kg)  07/02/20 280 lb (127 kg)    Glycemic control:    Lab Results  Component Value Date   HGBA1C 8.3 (H) 01/18/2021   HGBA1C 8.1 (H) 09/29/2020   HGBA1C 8.3 (H) 06/29/2020   Lab Results  Component Value Date   MICROALBUR <0.7 06/29/2020   LDLCALC 70 09/12/2019   CREATININE 1.15 01/18/2021   Lab Results  Component Value Date   MICRALBCREAT 0.5 06/29/2020    Lab on 01/18/2021  Component Date Value Ref Range Status   Sodium 01/18/2021 138  135 - 145 mEq/L Final   Potassium 01/18/2021 3.7  3.5 - 5.1 mEq/L Final   Chloride 01/18/2021 97  96 - 112 mEq/L Final   CO2 01/18/2021 32  19 - 32 mEq/L Final   Glucose, Bld 01/18/2021 150 (H)  70 - 99 mg/dL Final   BUN 01/18/2021 14  6 - 23 mg/dL Final   Creatinine, Ser 01/18/2021 1.15  0.40 - 1.50 mg/dL Final   GFR 01/18/2021 71.82  >60.00 mL/min Final   Calculated using the CKD-EPI Creatinine Equation (2021)   Calcium 01/18/2021 9.6  8.4 - 10.5 mg/dL Final   Hgb A1c MFr Bld 01/18/2021 8.3 (H)  4.6 - 6.5 % Final   Glycemic Control Guidelines for People with Diabetes:Non Diabetic:  <6%Goal of Therapy: <7%Additional Action Suggested:  >8%      Allergies as of 01/24/2021   No Known Allergies      Medication List        Accurate as of January 24, 2021  4:44 PM. If you have any questions, ask your nurse or doctor.          STOP taking these medications    tirzepatide 7.5 MG/0.5ML  Pen Commonly known as: MOUNJARO Replaced by: tirzepatide 5 MG/0.5ML Pen Stopped by: Elayne Snare, MD       TAKE these medications    amLODipine 10 MG tablet Commonly known as: NORVASC Take 10 mg by mouth daily.   atorvastatin 80 MG tablet Commonly known as: LIPITOR Take 80 mg by mouth daily.   Contour Next Test test strip Generic drug: glucose blood Use as instructed   OneTouch Verio test strip Generic drug: glucose blood USE TO CHECK  BLOOD SUGAR 2 TIMES DAILY AS INSTRUCTED   gabapentin 300 MG capsule Commonly known as: NEURONTIN Take by mouth.   Jardiance 25 MG Tabs tablet Generic drug: empagliflozin TAKE ONE TABLET BY MOUTH ONE TIME DAILY before breakfast   metFORMIN 500 MG 24 hr tablet Commonly known as: GLUCOPHAGE-XR Take 4 tablets (2,000 mg total) by mouth daily with supper. What changed: additional instructions   metoprolol succinate 100 MG 24 hr tablet Commonly known as: TOPROL-XL Take 100 mg by mouth daily.   multivitamin-iron-minerals-folic acid chewable tablet Chew 1 tablet by mouth daily.   nystatin cream Commonly known as: MYCOSTATIN Apply 1 application topically 2 (two) times daily.   OneTouch Delica Plus [id number] Misc Use to check blood sugar 2 times a day   OneTouch Verio Flex System w/Device Kit Use to check blood sugar 2 times a day   telmisartan-hydrochlorothiazide 80-25 MG tablet Commonly known as: MICARDIS HCT Take 1 tablet by mouth daily.   tirzepatide 5 MG/0.5ML Pen Commonly known as: MOUNJARO Inject 5 mg into the skin once a week. Replaces: tirzepatide 7.5 MG/0.5ML Pen Started by: Elayne Snare, MD   tirzepatide 7.5 MG/0.5ML Pen Commonly known as: MOUNJARO Inject 7.5 mg into the skin once a week. Started by: Elayne Snare, MD   Tyler Aas FlexTouch 100 UNIT/ML FlexTouch Pen Generic drug: insulin degludec Inject 0.24 mls (24 units) into the skin once a day        Allergies: No Known Allergies  Past Medical History:   Diagnosis Date   Diabetes mellitus    Hyperlipidemia    Hypertension     Past Surgical History:  Procedure Laterality Date   LAPAROSCOPIC GASTRIC BANDING  01/29/2007    No family history on file.  Social History:  reports that he has never smoked. He has never used smokeless tobacco. He reports current alcohol use of about 5.0 standard drinks per week. He reports that he does not use  drugs.   Review of Systems   Lipid history:  LDL is below 100 Prescribed Lipitor 80 mg from PCP and followed with him  . Lab Results  Component Value Date   CHOL 135 09/12/2019   CHOL 164 07/10/2019   CHOL 149 10/16/2018   Lab Results  Component Value Date   HDL 48.40 09/12/2019   HDL 37.80 (L) 07/10/2019   HDL 53.10 10/16/2018   Lab Results  Component Value Date   LDLCALC 70 09/12/2019   LDLCALC 63 10/16/2018   [place] 85 01/15/2018   Lab Results  Component Value Date   TRIG 82.0 09/12/2019   TRIG (H) 07/10/2019    405.0 Triglyceride is over 400; calculations on Lipids are invalid.   TRIG 161.0 (H) 10/16/2018   Lab Results  Component Value Date   CHOLHDL 3 09/12/2019   CHOLHDL 4 07/10/2019   CHOLHDL 3 10/16/2018   Lab Results  Component Value Date   LDLDIRECT 78.0 07/10/2019   LDLDIRECT 71.0 06/20/2016            Hypertension: This is followed by PCP  Is on Micardis 80/25 HCT and amlodipine 10 mg  BP Readings from Last 3 Encounters:  01/24/21 122/80  10/19/20 132/72  08/11/20 112/76    Most recent eye exam was in 1/22   Most recent foot exam: 1/21  He has sleep apnea and using the CPAP  LABS:  Lab on 01/18/2021  Component Date Value Ref Range Status   Sodium 01/18/2021 138  135 - 145 mEq/L Final   Potassium 01/18/2021 3.7  3.5 - 5.1 mEq/L Final   Chloride 01/18/2021 97  96 - 112 mEq/L Final   CO2 01/18/2021 32  19 - 32 mEq/L Final   Glucose, Bld 01/18/2021 150 (H)  70 - 99 mg/dL Final   BUN 01/18/2021 14  6 - 23 mg/dL Final   Creatinine, Ser  01/18/2021 1.15  0.40 - 1.50 mg/dL Final   GFR 01/18/2021 71.82  >60.00 mL/min Final   Calculated using the CKD-EPI Creatinine Equation (2021)   Calcium 01/18/2021 9.6  8.4 - 10.5 mg/dL Final   Hgb A1c MFr Bld 01/18/2021 8.3 (H)  4.6 - 6.5 % Final   Glycemic Control Guidelines for People with Diabetes:Non Diabetic:  <6%Goal of Therapy: <7%Additional Action Suggested:  >8%      Physical Examination:  BP 122/80    Pulse 78    Ht 6' 3.5" (1.918 m)    Wt 277 lb 6.4 oz (125.8 kg)    SpO2 97%    BMI 34.21 kg/m      ASSESSMENT:  Diabetes type 2, uncontrolled, with BMI 34  See history of present illness for detailed discussion of current diabetes management, blood sugar patterns and problems identified  His A1c is 8.3 compared to 8.1  A1c is continuing to be high likely because of postprandial readings Also he has not taken any Ozempic or Mounjaro in the last month or so  As before is not very consistent with portions, glucose monitoring snacks, exercise regimen  Recently fasting readings are higher at home also Discussed that he may benefit from continuous glucose monitoring  He is on Mounjaro recently, Jardiance 25 mg, Metformin and basal insulin Does not appear to be doing better so far with Mounjaro compared to Ozempic but likely needs a higher dose, blood sugars may be higher recently from not taking any for the last month Also may be insulin deficient  Hypertension: Blood pressure is  well controlled    PLAN:    Again explained to him that his blood sugar control is suboptimal consistently He may be insulin deficient and not clear if he needs mealtime insulin However since the has not tried relatively higher dose of Mounjaro and has only tried 5 mg, titrate this progressively as needed He was going to switch his pharmacy to [facility] and giving him 5 mg Mounjaro prescription along with co-pay card and patient information He will also need to be filling the 7.5 mg prescription  next month if available and written prescription given  Discussed importance of checking blood sugars and recommended using the freestyle libre but he is reluctant to do this He is aware of needing to check blood sugars after dinner and he will start doing this Also discussed that if his morning sugars are coming down with higher doses of Mounjaro he will need to reduce the [place] Discussed blood sugar targets before breakfast and after dinner  Start regular exercise 3 or 4 times a week at the gym  Follow-up in 3 months  Patient Instructions  Check blood sugars on waking up 3 days a week  Also check blood sugars about 2 hours after meals and do this after different meals by rotation  Recommended blood sugar levels on waking up are 90-130 and about 2 hours after meal is 130-160  Please bring your blood sugar monitor to each visit, thank you  Mounjaro 13m for 4 weeks then 7.5  Restart exercise  Total visit time including counseling: 30 minutes   AElayne Snare1/09/2021, 4:44 PM   Note: This office note was prepared with Dragon voice recognition system technology. Any transcriptional errors that result from this process are unintentional.

## 2021-01-25 ENCOUNTER — Other Ambulatory Visit (HOSPITAL_COMMUNITY): Payer: Self-pay

## 2021-01-25 ENCOUNTER — Telehealth: Payer: Self-pay | Admitting: Pharmacy Technician

## 2021-01-25 DIAGNOSIS — E1165 Type 2 diabetes mellitus with hyperglycemia: Secondary | ICD-10-CM

## 2021-01-25 NOTE — Telephone Encounter (Signed)
Patient Advocate Encounter   Received notification from [employer] that prior authorization for Mounjaro 5mg  is required by his/her insurance [employer].   PA submitted on 01/25/21 Key [id number] Status is pending    Per PA form, covered alternatives are Byetta or Bydureon and Trulicity.   I know pt can likely use coupon, but just noting that PA might not be approved.  Trulicity is a $25 copay.

## 2021-01-28 NOTE — Telephone Encounter (Signed)
Patient Advocate Encounter  Received notification from [employer] that the request for prior authorization for [facility] 5mg  has been denied due to the pt not trying Trulicity, Byetta or Bydureon.      Specialty Pharmacy Patient Advocate Fax: [phone]

## 2021-01-31 ENCOUNTER — Other Ambulatory Visit (HOSPITAL_COMMUNITY): Payer: Self-pay

## 2021-01-31 NOTE — Telephone Encounter (Signed)
Patient states gave all information off copay card to pharmacy. Pharmacy will contact patient in a couple of days.

## 2021-02-01 ENCOUNTER — Other Ambulatory Visit (HOSPITAL_COMMUNITY): Payer: Self-pay

## 2021-02-03 ENCOUNTER — Other Ambulatory Visit (HOSPITAL_COMMUNITY): Payer: Self-pay

## 2021-02-03 ENCOUNTER — Telehealth: Payer: Self-pay | Admitting: Pharmacy Technician

## 2021-02-03 MED ORDER — OZEMPIC (1 MG/DOSE) 4 MG/3ML ~~LOC~~ SOPN
1.0000 mg | PEN_INJECTOR | SUBCUTANEOUS | 1 refills | Status: DC
Start: 2021-02-03 — End: 2021-05-05

## 2021-02-03 NOTE — Addendum Note (Signed)
Addended by: Eliseo Squires on: 02/03/2021 12:02 PM   Modules accepted: Orders

## 2021-02-03 NOTE — Telephone Encounter (Signed)
Patient Advocate Encounter   Received notification from CoverMyMeds that prior authorization for Ozempic 1mg  is required by his/her insurance [employer].    PA started on 02/03/21 Key [id number]  Questions on the PA form: The covered alternatives are: Byetta or Bydureon, Trulicity. - The pt hasn't taken any of these.   For Byetta or Bydureon, which of the following applies to your patient?        The patient tried this alternative, but it caused a significant intolerance OR it didn't work well enough.        The patient is able to try this alternative, but has not done so yet.        Patient is unable to take this alternative because they have at least one contraindication or warning as listed in the alternative drug's prescribing information.        Patient is unable to take this alternative and requires the dosage formulation of the requested medication.        Patient is not a candidate for this alternative due to a disease characteristic, individual clinical factor(s), or other attribute/condition.        Other Provide additional clinical information that you feel is important to this review.

## 2021-02-08 NOTE — Telephone Encounter (Signed)
Patient called and states that the pharmacy is requesting the CODE on the [place]. Patient was informed by pharmacy that this information is required. [facility] # [place], [place] Phone:  [phone]  Fax:  [phone]

## 2021-02-09 ENCOUNTER — Telehealth: Payer: Self-pay

## 2021-02-09 ENCOUNTER — Other Ambulatory Visit (HOSPITAL_COMMUNITY): Payer: Self-pay

## 2021-02-09 DIAGNOSIS — E1165 Type 2 diabetes mellitus with hyperglycemia: Secondary | ICD-10-CM

## 2021-02-09 NOTE — Telephone Encounter (Signed)
Patient Advocate Encounter  Received notification from [employer] that the request for prior authorization for Ozempic has been denied due to the patient not trying 2 alt meds.      Specialty Pharmacy Patient Advocate Fax: [phone]

## 2021-02-09 NOTE — Telephone Encounter (Signed)
RTC for Trulicity 1.5mg /0.17ml pen injectors.  No PA needed.  Copay is $25, called [facility] & they need a new RX with the ICD 10 code on it to fill.

## 2021-02-10 MED ORDER — TRULICITY 1.5 MG/0.5ML ~~LOC~~ SOAJ
SUBCUTANEOUS | 2 refills | Status: DC
Start: 2021-02-10 — End: 2021-05-02

## 2021-02-10 NOTE — Addendum Note (Signed)
Addended by: Eliseo Squires on: 02/10/2021 03:39 PM   Modules accepted: Orders

## 2021-02-10 NOTE — Telephone Encounter (Signed)
Prescription sent

## 2021-02-10 NOTE — Telephone Encounter (Signed)
Trulicity sent on different encounter

## 2021-02-11 ENCOUNTER — Other Ambulatory Visit: Payer: Self-pay | Admitting: Endocrinology

## 2021-02-14 ENCOUNTER — Other Ambulatory Visit (HOSPITAL_COMMUNITY): Payer: Self-pay

## 2021-02-14 ENCOUNTER — Telehealth: Payer: Self-pay

## 2021-02-14 DIAGNOSIS — Z794 Long term (current) use of insulin: Secondary | ICD-10-CM

## 2021-02-14 DIAGNOSIS — E1165 Type 2 diabetes mellitus with hyperglycemia: Secondary | ICD-10-CM

## 2021-02-14 NOTE — Telephone Encounter (Signed)
Patient Advocate Encounter   Received notification from patient calls that prior authorization for Jardiance 25mg  tabs is required by his/her insurance [employer].   PA submitted on 02/14/21  Key#: [id number]   Status is pending    [facility] will continue to follow:  Patient Advocate Fax: [phone]

## 2021-02-22 ENCOUNTER — Other Ambulatory Visit (HOSPITAL_COMMUNITY): Payer: Self-pay

## 2021-02-22 MED ORDER — DAPAGLIFLOZIN PROPANEDIOL 10 MG PO TABS
10.0000 mg | ORAL_TABLET | Freq: Every day | ORAL | 1 refills | Status: DC
Start: 2021-02-22 — End: 2021-10-21

## 2021-02-22 NOTE — Telephone Encounter (Signed)
Pt called in to check status of PA. Please provide update

## 2021-02-22 NOTE — Telephone Encounter (Signed)
Patient Advocate Encounter  Received notification from Rosann Auerbach that the request for prior authorization for Jardiance 25mg  tabs has been denied due to the patient not trying , which is preferred.     Specialty Pharmacy Patient Advocate Fax: [phone]

## 2021-02-22 NOTE — Telephone Encounter (Signed)
Rx sent to pharmacy. I attempted to call patient no answer. Left vm to call back

## 2021-02-22 NOTE — Telephone Encounter (Signed)
Patient called back and I informed him about the [place]. Rx was sent to pharmacy.

## 2021-02-22 NOTE — Addendum Note (Signed)
Addended by: Eliseo Squires on: 02/22/2021 04:01 PM   Modules accepted: Orders

## 2021-02-24 ENCOUNTER — Ambulatory Visit: Payer: 59 | Admitting: Endocrinology

## 2021-03-01 ENCOUNTER — Other Ambulatory Visit: Payer: Self-pay | Admitting: Endocrinology

## 2021-03-03 ENCOUNTER — Telehealth: Payer: Self-pay

## 2021-03-03 DIAGNOSIS — Z794 Long term (current) use of insulin: Secondary | ICD-10-CM

## 2021-03-03 DIAGNOSIS — E1165 Type 2 diabetes mellitus with hyperglycemia: Secondary | ICD-10-CM

## 2021-03-03 MED ORDER — TRESIBA FLEXTOUCH 100 UNIT/ML ~~LOC~~ SOPN: PEN_INJECTOR | SUBCUTANEOUS | 2 refills | Status: DC

## 2021-03-03 NOTE — Telephone Encounter (Signed)
Per last office note you wanted patient to decrease tresiba since he was on mounjaro. Since he is not on it because of insurance do you still want him to decrease? Pharmacy requesting refill.

## 2021-03-04 ENCOUNTER — Telehealth: Payer: Self-pay

## 2021-03-04 ENCOUNTER — Other Ambulatory Visit (HOSPITAL_COMMUNITY): Payer: Self-pay

## 2021-03-04 NOTE — Telephone Encounter (Signed)
Patient Advocate Encounter   Received notification from patient calls that prior authorization for Gregory Brady flextouch is required by his/her insurance [employer].   PA submitted on 03/04/21  Key#: [id number]  Status is pending    [facility] will continue to follow:  Patient Advocate Fax: [phone]

## 2021-03-07 NOTE — Telephone Encounter (Signed)
Pt left 2 voicemails about his [place]. He is currently out. Informed patient that insurance is requesting PA and PA is still pending. He will stop by office today to get a sample.

## 2021-03-08 NOTE — Telephone Encounter (Signed)
Pt came by the office and picked up one box of [place].

## 2021-03-24 NOTE — Telephone Encounter (Signed)
Patient Advocate Encounter ? ?Received notification from [employer] that the request for prior authorization for Gregory Brady has been denied due to the patient not trying Levemir and Basaglar.  ?  ? ?Specialty Pharmacy Patient Advocate ?Fax: [phone]  ?

## 2021-03-28 ENCOUNTER — Other Ambulatory Visit: Payer: Self-pay

## 2021-03-28 DIAGNOSIS — E1165 Type 2 diabetes mellitus with hyperglycemia: Secondary | ICD-10-CM

## 2021-03-28 DIAGNOSIS — Z794 Long term (current) use of insulin: Secondary | ICD-10-CM

## 2021-03-28 MED ORDER — BASAGLAR KWIKPEN 100 UNIT/ML ~~LOC~~ SOPN: PEN_INJECTOR | SUBCUTANEOUS | 1 refills | Status: DC

## 2021-04-29 ENCOUNTER — Other Ambulatory Visit (INDEPENDENT_AMBULATORY_CARE_PROVIDER_SITE_OTHER): Payer: Managed Care, Other (non HMO)

## 2021-04-29 DIAGNOSIS — E1165 Type 2 diabetes mellitus with hyperglycemia: Secondary | ICD-10-CM

## 2021-04-29 DIAGNOSIS — E782 Mixed hyperlipidemia: Secondary | ICD-10-CM | POA: Diagnosis not present

## 2021-04-29 DIAGNOSIS — Z794 Long term (current) use of insulin: Secondary | ICD-10-CM | POA: Diagnosis not present

## 2021-04-29 LAB — BASIC METABOLIC PANEL
BUN: 12 mg/dL (ref 6–23)
CO2: 33 mEq/L — ABNORMAL HIGH (ref 19–32)
Calcium: 9.9 mg/dL (ref 8.4–10.5)
Chloride: 98 mEq/L (ref 96–112)
Creatinine, Ser: 1.02 mg/dL (ref 0.40–1.50)
GFR: 82.78 mL/min (ref >60.00)
Glucose, Bld: 125 mg/dL — ABNORMAL HIGH (ref 70–99)
Potassium: 4.2 mEq/L (ref 3.5–5.1)
Sodium: 139 mEq/L (ref 135–145)

## 2021-04-29 LAB — LDL CHOLESTEROL, DIRECT: Direct LDL: 99 mg/dL

## 2021-04-29 LAB — HEMOGLOBIN A1C: Hgb A1c MFr Bld: 8.5 % — ABNORMAL HIGH (ref 4.6–6.5)

## 2021-05-01 ENCOUNTER — Other Ambulatory Visit: Payer: Self-pay | Admitting: Endocrinology

## 2021-05-01 DIAGNOSIS — E1165 Type 2 diabetes mellitus with hyperglycemia: Secondary | ICD-10-CM

## 2021-05-05 ENCOUNTER — Ambulatory Visit (INDEPENDENT_AMBULATORY_CARE_PROVIDER_SITE_OTHER): Payer: Managed Care, Other (non HMO) | Admitting: Endocrinology

## 2021-05-05 ENCOUNTER — Encounter: Payer: Self-pay | Admitting: Endocrinology

## 2021-05-05 VITALS — BP 138/84 | HR 69 | Ht 75.5 in | Wt 280.6 lb

## 2021-05-05 DIAGNOSIS — I10 Essential (primary) hypertension: Secondary | ICD-10-CM

## 2021-05-05 DIAGNOSIS — E782 Mixed hyperlipidemia: Secondary | ICD-10-CM

## 2021-05-05 DIAGNOSIS — Z794 Long term (current) use of insulin: Secondary | ICD-10-CM | POA: Diagnosis not present

## 2021-05-05 DIAGNOSIS — E1165 Type 2 diabetes mellitus with hyperglycemia: Secondary | ICD-10-CM | POA: Diagnosis not present

## 2021-05-05 MED ORDER — TRULICITY 4.5 MG/0.5ML ~~LOC~~ SOAJ: SUBCUTANEOUS | 2 refills | Status: DC

## 2021-05-05 NOTE — Progress Notes (Signed)
?       ? ? ?Reason for Appointment: Follow-up for Type 2 Diabetes ? ?Referring physician: Ehinger ? ? ?History of Present Illness:  ?        ?Date of diagnosis of type 2 diabetes mellitus:?  1997       ? ?Background history:  ? ?He was symptomatic at diagnosis with increased urination ?He has been mostly treated with metformin and glipizide in the past ?He was taking Januvia more recently but this was changed in 2017 2 Onglyza because of insurance preference ?He does not think his sugars have been consistently controlled in the past, best A1c recently appears to be in 05/2014 which was 7.5 but subsequently has been at least 8% ? ?Recent history:  ? ?BASAGLAR insulin: 24 units daily ? ?Non-insulin hypoglycemic drugs: Metformin ER 500, 4 tablets in a.m., Farxiga 10 mg, Trulicity 1.5 mg weekly ? ?A1c has been consistently high, now 8.5 ? ?Fructosamine previously 281 ? ?Current management, blood sugar patterns and problems identified: ?He has not been able to get Ozempic or Mounjaro approved by his insurance this year and has 2 try Trulicity ?Likely because of only taking 1.5 mg Trulicity his blood sugars appear to be relatively higher  ?However blood sugars are variable and mostly checked in the morning again  ?As before he is not able to consistently follow his diet with eating either more carbohydrates or high-fat foods, last night he had a high-fat meal causing his sugar to be 210 this morning  ?He says he is eating dinner late and may be snacking and does not check his sugars after eating  ?His [place] Aas was changed to [employer] by his insurance ? lowest reading at home 10 8 in the morning ?In the last month or so he has started going for exercise ?However his weight is about the same or higher ?      ?Side effects from medications have been: Periodic balanitis from [place] ? ?Compliance with the medical regimen: Inconsistent ? ?Glucose monitoring: One Touch Verio ? ? ?PRE-MEAL Fasting Lunch Dinner Bedtime Overall   ?Glucose range: 108-210 114 122, 151    ?Mean/median: 162    147  ? ?POST-MEAL PC Breakfast PC Lunch PC Dinner  ?Glucose range:   ?  ?Mean/median:     ? ? ?Previous blood sugars: 136-164 fasting with only 3 readings ? ?Self-care: The diet that the patient has been following is: tries to limit fast food usually .    ?  ?Meal times are:  Breakfast is at 9 AM-12 pm Lunch: 2-4 PM Dinner:  6-8 PM ?             ?Dietician visit, most recent: 9/17 ? ?Weight history:  ? ?He had a gastric band in 2009 but this was loosened a couple of years ago because with taking metformin he would get uncomfortably bloated and has gained weight ? ?Wt Readings from Last 3 Encounters:  ?05/05/21 280 lb 9.6 oz (127.3 kg)  ?01/24/21 277 lb 6.4 oz (125.8 kg)  ?10/19/20 277 lb (125.6 kg)  ? ? ?Glycemic control:  ?  ?Lab Results  ?Component Value Date  ? HGBA1C 8.5 (H) 04/29/2021  ? HGBA1C 8.3 (H) 01/18/2021  ? HGBA1C 8.1 (H) 09/29/2020  ? ?Lab Results  ?Component Value Date  ? MICROALBUR <0.7 06/29/2020  ? [place] 70 09/12/2019  ? CREATININE 1.02 04/29/2021  ? ?Lab Results  ?Component Value Date  ? MICRALBCREAT 0.5 06/29/2020  ? ? ?Lab  on 04/29/2021  ?Component Date Value Ref Range Status  ? Direct LDL 04/29/2021 99.0  mg/dL Final  ? Optimal:  <100 mg/dLNear or Above Optimal:  100-129 mg/dLBorderline High:  130-159 mg/dLHigh:  160-189 mg/dLVery High:  >190 mg/dL  ? Sodium 04/29/2021 139  135 - 145 mEq/L Final  ? Potassium 04/29/2021 4.2  3.5 - 5.1 mEq/L Final  ? Chloride 04/29/2021 98  96 - 112 mEq/L Final  ? CO2 04/29/2021 33 (H)  19 - 32 mEq/L Final  ? Glucose, Bld 04/29/2021 125 (H)  70 - 99 mg/dL Final  ? BUN 04/29/2021 12  6 - 23 mg/dL Final  ? Creatinine, Ser 04/29/2021 1.02  0.40 - 1.50 mg/dL Final  ? GFR 04/29/2021 82.78  >60.00 mL/min Final  ? Calculated using the CKD-EPI Creatinine Equation (2021)  ? Calcium 04/29/2021 9.9  8.4 - 10.5 mg/dL Final  ? Hgb A1c MFr Bld 04/29/2021 8.5 (H)  4.6 - 6.5 % Final  ? Glycemic Control Guidelines  for People with Diabetes:Non Diabetic:  <6%Goal of Therapy: <7%Additional Action Suggested:  >8%   ? ? ? ?Allergies as of 05/05/2021   ?No Known Allergies ?  ? ?  ?Medication List  ?  ? ?  ? Accurate as of May 05, 2021  1:41 PM. If you have any questions, ask your nurse or doctor.  ?  ?  ? ?  ? ?STOP taking these medications   ? ?gabapentin 300 MG capsule ?Commonly known as: NEURONTIN ?Stopped by: Elayne Snare, MD ?  ?Ozempic (1 MG/DOSE) 4 MG/3ML Sopn ?Generic drug: Semaglutide (1 MG/DOSE) ?Stopped by: Elayne Snare, MD ?  ?tirzepatide 5 MG/0.5ML Pen ?Commonly known as: MOUNJARO ?Stopped by: Elayne Snare, MD ?  ?tirzepatide 7.5 MG/0.5ML Pen ?Commonly known as: MOUNJARO ?Stopped by: Elayne Snare, MD ?  ? ?  ? ?TAKE these medications   ? ?amLODipine 10 MG tablet ?Commonly known as: NORVASC ?Take 10 mg by mouth daily. ?  ?atorvastatin 80 MG tablet ?Commonly known as: LIPITOR ?Take 80 mg by mouth daily. ?  ?Basaglar KwikPen 100 UNIT/ML ?Inject 0.24 mls (24 units) into the skin once a day ?  ?Contour Next Test test strip ?Generic drug: glucose blood ?Use as instructed ?  ?OneTouch Verio test strip ?Generic drug: glucose blood ?USE TO CHECK  BLOOD SUGAR 2 TIMES DAILY AS INSTRUCTED ?  ?dapagliflozin propanediol 10 MG Tabs tablet ?Commonly known as: [place] ?Take 1 tablet (10 mg total) by mouth daily before breakfast. ?  ?metFORMIN 500 MG 24 hr tablet ?Commonly known as: GLUCOPHAGE-XR ?Take 4 tablets (2,000 mg total) by mouth daily with supper. ?What changed: additional instructions ?  ?metoprolol succinate 100 MG 24 hr tablet ?Commonly known as: TOPROL-XL ?Take 100 mg by mouth daily. ?  ?multivitamin-iron-minerals-folic acid chewable tablet ?Chew 1 tablet by mouth daily. ?  ?nystatin cream ?Commonly known as: MYCOSTATIN ?Apply 1 application topically 2 (two) times daily. ?  ?OneTouch Delica Plus [id number] Misc ?Use to check blood sugar 2 times a day ?  ?OneTouch Verio Flex System w/Device Kit ?Use to check blood sugar 2 times a  day ?  ?telmisartan-hydrochlorothiazide 80-25 MG tablet ?Commonly known as: MICARDIS HCT ?Take 1 tablet by mouth daily. ?  ?Tyler Aas FlexTouch 100 UNIT/ML FlexTouch Pen ?Generic drug: insulin degludec ?Inject 0.24 mls (24 units) into the skin once a day ?  ?Trulicity 1.5 BT/5.9RC Sopn ?Generic drug: Dulaglutide ?inject 1.22m(0.5ml) under the skin once a week ?  ? ?  ? ? ?Allergies:  No Known Allergies ? ?Past Medical History:  ?Diagnosis Date  ? Diabetes mellitus   ? Hyperlipidemia   ? Hypertension   ? ? ?Past Surgical History:  ?Procedure Laterality Date  ? LAPAROSCOPIC GASTRIC BANDING  01/29/2007  ? ? ?No family history on file. ? ?Social History:  reports that he has never smoked. He has never used smokeless tobacco. He reports current alcohol use of about 5.0 standard drinks per week. He reports that he does not use drugs. ? ? ?Review of Systems ? ? ?Lipid history:  LDL is variable but usually below 100 ?He has been given Lipitor 80 mg from PCP  ? ?. ?Lab Results  ?Component Value Date  ? CHOL 135 09/12/2019  ? CHOL 164 07/10/2019  ? CHOL 149 10/16/2018  ? ?Lab Results  ?Component Value Date  ? HDL 48.40 09/12/2019  ? HDL 37.80 (L) 07/10/2019  ? HDL 53.10 10/16/2018  ? ?Lab Results  ?Component Value Date  ? [place] 70 09/12/2019  ? [place] 63 10/16/2018  ? [place] 85 01/15/2018  ? ?Lab Results  ?Component Value Date  ? TRIG 82.0 09/12/2019  ? TRIG (H) 07/10/2019  ?  405.0 Triglyceride is over 400; calculations on Lipids are invalid.  ? TRIG 161.0 (H) 10/16/2018  ? ?Lab Results  ?Component Value Date  ? CHOLHDL 3 09/12/2019  ? CHOLHDL 4 07/10/2019  ? CHOLHDL 3 10/16/2018  ? ?Lab Results  ?Component Value Date  ? LDLDIRECT 99.0 04/29/2021  ? LDLDIRECT 78.0 07/10/2019  ? LDLDIRECT 71.0 06/20/2016  ? ?     ?    ?Hypertension: This is followed by PCP ? ?Is on Micardis 80/25 HCT and amlodipine 10 mg ? ?BP Readings from Last 3 Encounters:  ?05/05/21 138/84  ?01/24/21 122/80  ?10/19/20 132/72  ? ? ?Most recent eye exam was  in 1/22  ? ?Most recent foot exam: 1/21 ? ?He has sleep apnea and using the CPAP ? ?LABS: ? ?Lab on 04/29/2021  ?Component Date Value Ref Range Status  ? Direct LDL 04/29/2021 99.0  mg/dL Final  ? Optim

## 2021-05-05 NOTE — Patient Instructions (Signed)
Check blood sugars on waking up 3-4 days a week  Also check blood sugars about 2 hours after meals and do this after different meals by rotation  Recommended blood sugar levels on waking up are 90-130 and about 2 hours after meal is 130-180  Please bring your blood sugar monitor to each visit, thank you  

## 2021-05-17 ENCOUNTER — Telehealth: Payer: Self-pay

## 2021-05-17 NOTE — Telephone Encounter (Signed)
Patient called about assessment form he dropped off on 05/06/21. Informed patient that I had not received anything. After speaking with front if got mixed with some other paperwork up front. Informed patient I will give to Dr Lucianne Muss and have it completed. I will reach out to patient when completed. ?

## 2021-05-19 NOTE — Telephone Encounter (Signed)
Contacted patient to inform paperwork has been completed. It will be up front when he comes to pick it up ?

## 2021-05-23 NOTE — Telephone Encounter (Signed)
Patient picked up paperwork 05/23/21 ?

## 2021-08-01 ENCOUNTER — Other Ambulatory Visit (INDEPENDENT_AMBULATORY_CARE_PROVIDER_SITE_OTHER): Payer: Commercial Managed Care - HMO

## 2021-08-01 DIAGNOSIS — Z794 Long term (current) use of insulin: Secondary | ICD-10-CM

## 2021-08-01 DIAGNOSIS — E1165 Type 2 diabetes mellitus with hyperglycemia: Secondary | ICD-10-CM

## 2021-08-01 LAB — BASIC METABOLIC PANEL
BUN: 13 mg/dL (ref 6–23)
CO2: 33 mEq/L — ABNORMAL HIGH (ref 19–32)
Calcium: 9.5 mg/dL (ref 8.4–10.5)
Chloride: 98 mEq/L (ref 96–112)
Creatinine, Ser: 1 mg/dL (ref 0.40–1.50)
GFR: 84.61 mL/min (ref >60.00)
Glucose, Bld: 164 mg/dL — ABNORMAL HIGH (ref 70–99)
Potassium: 4.2 mEq/L (ref 3.5–5.1)
Sodium: 141 mEq/L (ref 135–145)

## 2021-08-01 LAB — MICROALBUMIN / CREATININE URINE RATIO
Creatinine,U: 112.6 mg/dL
Microalb Creat Ratio: 0.6 mg/g (ref 0.0–30.0)
Microalb, Ur: <0.7 mg/dL (ref 0.0–1.9)

## 2021-08-01 LAB — HEMOGLOBIN A1C: Hgb A1c MFr Bld: 8.7 % — ABNORMAL HIGH (ref 4.6–6.5)

## 2021-08-02 ENCOUNTER — Other Ambulatory Visit: Payer: Self-pay | Admitting: Endocrinology

## 2021-08-02 DIAGNOSIS — E1165 Type 2 diabetes mellitus with hyperglycemia: Secondary | ICD-10-CM

## 2021-08-04 ENCOUNTER — Encounter: Payer: Self-pay | Admitting: Endocrinology

## 2021-08-04 ENCOUNTER — Ambulatory Visit (INDEPENDENT_AMBULATORY_CARE_PROVIDER_SITE_OTHER): Payer: Commercial Managed Care - HMO | Admitting: Endocrinology

## 2021-08-04 VITALS — BP 108/78 | HR 76 | Ht 75.5 in | Wt 281.0 lb

## 2021-08-04 DIAGNOSIS — I10 Essential (primary) hypertension: Secondary | ICD-10-CM | POA: Diagnosis not present

## 2021-08-04 DIAGNOSIS — E782 Mixed hyperlipidemia: Secondary | ICD-10-CM

## 2021-08-04 DIAGNOSIS — Z794 Long term (current) use of insulin: Secondary | ICD-10-CM | POA: Diagnosis not present

## 2021-08-04 DIAGNOSIS — E1165 Type 2 diabetes mellitus with hyperglycemia: Secondary | ICD-10-CM

## 2021-08-04 MED ORDER — TIRZEPATIDE 5 MG/0.5ML ~~LOC~~ SOAJ: 5.0000 mg | SUBCUTANEOUS | 1 refills | Status: DC

## 2021-08-04 NOTE — Patient Instructions (Addendum)
Exercise daily  More testing  Check blood sugars on waking up 3  days a week  Also check blood sugars about 2 hours after meals and do this after different meals by rotation  Recommended blood sugar levels on waking up are 90-130 and about 2 hours after meal is 130-160  Please bring your blood sugar monitor to each visit, thank you

## 2021-08-04 NOTE — Progress Notes (Signed)
Reason for Appointment: Follow-up for Type 2 Diabetes  Referring physician: Ehinger   History of Present Illness:          Date of diagnosis of type 2 diabetes mellitus:?  1997        Background history:   He was symptomatic at diagnosis with increased urination He has been mostly treated with metformin and glipizide in the past He was taking Januvia more recently but this was changed in 2017 2 Onglyza because of insurance preference He does not think his sugars have been consistently controlled in the past, best A1c recently appears to be in 05/2014 which was 7.5 but subsequently has been at least 8%  Recent history:   BASAGLAR insulin: 24 units daily  Non-insulin hypoglycemic drugs: Metformin ER 500, 4 tablets in a.m., Farxiga 10 mg, Trulicity 4.5 mg weekly  A1c has been gradually increasing and now 8.7  Fructosamine previously 281  Current management, blood sugar patterns and problems identified: He has been moved up to 4.5 mg Trulicity but neither his blood sugar control is better or his weight He does not watch his blood sugars much and only does a few readings sporadically just before he comes in  Also as before he is not able to consistently follow his diet with eating either more starchy foods or high fat meals or snacks and chips and soft drinks at bedtime  He has been only exercising some but not for the last 2 weeks  He will periodically forget his diabetes medications but he thinks he is usually taking Trulicity regularly and most of the time will take his Basaglar daily       Side effects from medications have been: Periodic balanitis from Jardiance  Compliance with the medical regimen: Inconsistent  Glucose monitoring: One Touch Verio  RECENT blood sugars: FASTING 128-153 with only 3 readings Bedtime 124, 126 the last 2 days  PREVIOUSLY:  PRE-MEAL Fasting Lunch Dinner Bedtime Overall  Glucose range: 108-210 114 122, 151    Mean/median: 162     147   POST-MEAL PC Breakfast PC Lunch PC Dinner  Glucose range:   ?  Mean/median:       Previous blood sugars: 136-164 fasting with only 3 readings  Self-care: The diet that the patient has been following is: tries to limit fast food usually .      Meal times are:  Breakfast is at 9 AM-12 pm Lunch: 2-4 PM Dinner:  6-8 PM              Dietician visit, most recent: 9/17  Weight history:   He had a gastric band in 2009 but this was loosened a couple of years ago because with taking metformin he would get uncomfortably bloated and has gained weight  Wt Readings from Last 3 Encounters:  08/04/21 281 lb (127.5 kg)  05/05/21 280 lb 9.6 oz (127.3 kg)  01/24/21 277 lb 6.4 oz (125.8 kg)    Glycemic control:    Lab Results  Component Value Date   HGBA1C 8.7 (H) 08/01/2021   HGBA1C 8.5 (H) 04/29/2021   HGBA1C 8.3 (H) 01/18/2021   Lab Results  Component Value Date   MICROALBUR <0.7 08/01/2021   LDLCALC 70 09/12/2019   CREATININE 1.00 08/01/2021   Lab Results  Component Value Date   MICRALBCREAT 0.6 08/01/2021    Lab on 08/01/2021  Component Date Value Ref Range Status   Microalb, Ur 08/01/2021 <  0.7  0.0 - 1.9 mg/dL Final   Creatinine,U 08/01/2021 112.6  mg/dL Final   Microalb Creat Ratio 08/01/2021 0.6  0.0 - 30.0 mg/g Final   Sodium 08/01/2021 141  135 - 145 mEq/L Final   Potassium 08/01/2021 4.2  3.5 - 5.1 mEq/L Final   Chloride 08/01/2021 98  96 - 112 mEq/L Final   CO2 08/01/2021 33 (H)  19 - 32 mEq/L Final   Glucose, Bld 08/01/2021 164 (H)  70 - 99 mg/dL Final   BUN 08/01/2021 13  6 - 23 mg/dL Final   Creatinine, Ser 08/01/2021 1.00  0.40 - 1.50 mg/dL Final   GFR 08/01/2021 84.61  >60.00 mL/min Final   Calculated using the CKD-EPI Creatinine Equation (2021)   Calcium 08/01/2021 9.5  8.4 - 10.5 mg/dL Final   Hgb A1c MFr Bld 08/01/2021 8.7 (H)  4.6 - 6.5 % Final   Glycemic Control Guidelines for People with Diabetes:Non Diabetic:  <6%Goal of Therapy: <7%Additional  Action Suggested:  >8%      Allergies as of 08/04/2021   No Known Allergies      Medication List        Accurate as of August 04, 2021 11:59 PM. If you have any questions, ask your nurse or doctor.          amLODipine 10 MG tablet Commonly known as: NORVASC Take 10 mg by mouth daily.   atorvastatin 80 MG tablet Commonly known as: LIPITOR Take 80 mg by mouth daily.   Basaglar KwikPen 100 UNIT/ML INJECT 24 UNITS (0.24MLS) INTO THE SKIN ONCE DAILY   Contour Next Test test strip Generic drug: glucose blood Use as instructed   OneTouch Verio test strip Generic drug: glucose blood USE TO CHECK  BLOOD SUGAR 2 TIMES DAILY AS INSTRUCTED   dapagliflozin propanediol 10 MG Tabs tablet Commonly known as: Farxiga Take 1 tablet (10 mg total) by mouth daily before breakfast.   metFORMIN 500 MG 24 hr tablet Commonly known as: GLUCOPHAGE-XR Take 4 tablets (2,000 mg total) by mouth daily with supper. What changed: additional instructions   metoprolol succinate 100 MG 24 hr tablet Commonly known as: TOPROL-XL Take 100 mg by mouth daily.   multivitamin-iron-minerals-folic acid chewable tablet Chew 1 tablet by mouth daily.   nystatin cream Commonly known as: MYCOSTATIN Apply 1 application topically 2 (two) times daily.   OneTouch Delica Plus [id number] Misc Use to check blood sugar 2 times a day   OneTouch Verio Flex System w/Device Kit Use to check blood sugar 2 times a day   telmisartan-hydrochlorothiazide 80-25 MG tablet Commonly known as: MICARDIS HCT Take 1 tablet by mouth daily.   tirzepatide 5 MG/0.5ML Pen Commonly known as: MOUNJARO Inject 5 mg into the skin once a week. Started by: Elayne Snare, MD   Tyler Aas FlexTouch 100 UNIT/ML FlexTouch Pen Generic drug: insulin degludec Inject 0.24 mls (24 units) into the skin once a day   Trulicity 4.5 [place]/7.0WC Sopn Generic drug: Dulaglutide Inject weekly        Allergies: No Known Allergies  Past Medical  History:  Diagnosis Date   Diabetes mellitus    Hyperlipidemia    Hypertension     Past Surgical History:  Procedure Laterality Date   LAPAROSCOPIC GASTRIC BANDING  01/29/2007    No family history on file.  Social History:  reports that he has never smoked. He has never used smokeless tobacco. He reports current alcohol use of about 5.0 standard drinks of alcohol per week.  He reports that he does not use drugs.   Review of Systems   Lipid history:  LDL is variable but usually below 100 He has been continued on Lipitor 80 mg from PCP   . Lab Results  Component Value Date   CHOL 135 09/12/2019   CHOL 164 07/10/2019   CHOL 149 10/16/2018   Lab Results  Component Value Date   HDL 48.40 09/12/2019   HDL 37.80 (L) 07/10/2019   HDL 53.10 10/16/2018   Lab Results  Component Value Date   LDLCALC 70 09/12/2019   LDLCALC 63 10/16/2018   [facility] 85 01/15/2018   Lab Results  Component Value Date   TRIG 82.0 09/12/2019   TRIG (H) 07/10/2019    405.0 Triglyceride is over 400; calculations on Lipids are invalid.   TRIG 161.0 (H) 10/16/2018   Lab Results  Component Value Date   CHOLHDL 3 09/12/2019   CHOLHDL 4 07/10/2019   CHOLHDL 3 10/16/2018   Lab Results  Component Value Date   LDLDIRECT 99.0 04/29/2021   LDLDIRECT 78.0 07/10/2019   LDLDIRECT 71.0 06/20/2016            Hypertension: This is followed by PCP  Is on Micardis 80/25 HCT and amlodipine 10 mg  BP Readings from Last 3 Encounters:  08/04/21 108/78  05/05/21 138/84  01/24/21 122/80    Most recent eye exam was in 01/2021  Most recent foot exam: 1/21  He has sleep apnea and using the CPAP  LABS:  Lab on 08/01/2021  Component Date Value Ref Range Status   Microalb, Ur 08/01/2021 <0.7  0.0 - 1.9 mg/dL Final   Creatinine,U 08/01/2021 112.6  mg/dL Final   Microalb Creat Ratio 08/01/2021 0.6  0.0 - 30.0 mg/g Final   Sodium 08/01/2021 141  135 - 145 mEq/L Final   Potassium 08/01/2021 4.2  3.5 -  5.1 mEq/L Final   Chloride 08/01/2021 98  96 - 112 mEq/L Final   CO2 08/01/2021 33 (H)  19 - 32 mEq/L Final   Glucose, Bld 08/01/2021 164 (H)  70 - 99 mg/dL Final   BUN 08/01/2021 13  6 - 23 mg/dL Final   Creatinine, Ser 08/01/2021 1.00  0.40 - 1.50 mg/dL Final   GFR 08/01/2021 84.61  >60.00 mL/min Final   Calculated using the CKD-EPI Creatinine Equation (2021)   Calcium 08/01/2021 9.5  8.4 - 10.5 mg/dL Final   Hgb A1c MFr Bld 08/01/2021 8.7 (H)  4.6 - 6.5 % Final   Glycemic Control Guidelines for People with Diabetes:Non Diabetic:  <6%Goal of Therapy: <7%Additional Action Suggested:  >8%      Physical Examination:  BP 108/78 (BP Location: Left Arm, Patient Position: Sitting, Cuff Size: Normal)   Pulse 76   Ht 6' 3.5" (1.918 m)   Wt 281 lb (127.5 kg)   SpO2 95%   BMI 34.66 kg/m      ASSESSMENT: 7/23  Diabetes type 2, uncontrolled, with BMI 34  See history of present illness for detailed discussion of current diabetes management, blood sugar patterns and problems identified  His A1c is 8.7 compared to 8.5  A1c is continuing to be increasing and lowest in the last year about 8.1 only  Current regimen is basal insulin, Farxiga and Trulicity 4.5 mg  As before he is not motivated to check his sugars consistently, exercise on a regular basis and also not always consistent with his medications He is also frequently eating snacks late at night such as  chips causing higher readings overnight also No documentation of any high readings after dinner but he likely checks his blood sugars only when he is watching his diet better  He would benefit from [facility] and not clear if this can be obtained with prior authorization now  Hypertension: Blood pressure is well controlled No history of microalbuminuria  LIPIDS: Will need follow-up in the next visit  PLAN:    Since he is reluctant to consider weight loss surgery he will be tried on Mounjaro again as above Given him a co-pay  card to try and get this at [facility] will be done if needed Once he finishes the 5 mg dose he will let us know and we will go up to 10 mg More consistent monitoring As before he refuses to consider CGM both Dexcom and libre  Continue same Basaglar dose  Regular exercise Cut back on snacks and high fat foods  Follow-up in 3 months  Patient Instructions  Exercise daily  More testing  Check blood sugars on waking up 3  days a week  Also check blood sugars about 2 hours after meals and do this after different meals by rotation  Recommended blood sugar levels on waking up are 90-130 and about 2 hours after meal is 130-160  Please bring your blood sugar monitor to each visit, thank you  Total visit time including counseling = 30 minutes   Elayne Snare 08/05/2021, 9:44 AM   Note: This office note was prepared with Dragon voice recognition system technology. Any transcriptional errors that result from this process are unintentional.

## 2021-08-10 ENCOUNTER — Other Ambulatory Visit (HOSPITAL_COMMUNITY): Payer: Self-pay

## 2021-08-10 ENCOUNTER — Telehealth: Payer: Self-pay

## 2021-08-10 DIAGNOSIS — E1165 Type 2 diabetes mellitus with hyperglycemia: Secondary | ICD-10-CM

## 2021-08-10 NOTE — Telephone Encounter (Signed)
Patient called in states that mounjaro cost too much and insurance will not cover. Wants to know what medication you would prefer him to be on instead.

## 2021-08-11 MED ORDER — TRULICITY 4.5 MG/0.5ML ~~LOC~~ SOAJ: SUBCUTANEOUS | 2 refills | Status: DC

## 2021-08-12 ENCOUNTER — Other Ambulatory Visit: Payer: Self-pay

## 2021-08-12 DIAGNOSIS — E1165 Type 2 diabetes mellitus with hyperglycemia: Secondary | ICD-10-CM

## 2021-08-12 MED ORDER — TRULICITY 4.5 MG/0.5ML ~~LOC~~ SOAJ: SUBCUTANEOUS | 2 refills | Status: DC

## 2021-10-07 ENCOUNTER — Other Ambulatory Visit: Payer: Self-pay | Admitting: Endocrinology

## 2021-10-07 DIAGNOSIS — E1165 Type 2 diabetes mellitus with hyperglycemia: Secondary | ICD-10-CM

## 2021-10-21 ENCOUNTER — Other Ambulatory Visit: Payer: Self-pay | Admitting: Endocrinology

## 2021-10-21 DIAGNOSIS — E1165 Type 2 diabetes mellitus with hyperglycemia: Secondary | ICD-10-CM

## 2021-11-08 ENCOUNTER — Other Ambulatory Visit (INDEPENDENT_AMBULATORY_CARE_PROVIDER_SITE_OTHER): Payer: Commercial Managed Care - HMO

## 2021-11-08 DIAGNOSIS — Z794 Long term (current) use of insulin: Secondary | ICD-10-CM | POA: Diagnosis not present

## 2021-11-08 DIAGNOSIS — E1165 Type 2 diabetes mellitus with hyperglycemia: Secondary | ICD-10-CM | POA: Diagnosis not present

## 2021-11-08 DIAGNOSIS — E782 Mixed hyperlipidemia: Secondary | ICD-10-CM

## 2021-11-08 LAB — COMPREHENSIVE METABOLIC PANEL
ALT: 17 U/L (ref 0–53)
AST: 21 U/L (ref 0–37)
Albumin: 4.5 g/dL (ref 3.5–5.2)
Alkaline Phosphatase: 90 U/L (ref 39–117)
BUN: 9 mg/dL (ref 6–23)
CO2: 34 mEq/L — ABNORMAL HIGH (ref 19–32)
Calcium: 10.3 mg/dL (ref 8.4–10.5)
Chloride: 100 mEq/L (ref 96–112)
Creatinine, Ser: 0.96 mg/dL (ref 0.40–1.50)
GFR: 88.69 mL/min (ref >60.00)
Glucose, Bld: 146 mg/dL — ABNORMAL HIGH (ref 70–99)
Potassium: 4.5 mEq/L (ref 3.5–5.1)
Sodium: 141 mEq/L (ref 135–145)
Total Bilirubin: 0.6 mg/dL (ref 0.2–1.2)
Total Protein: 8 g/dL (ref 6.0–8.3)

## 2021-11-08 LAB — LIPID PANEL
Cholesterol: 153 mg/dL (ref 0–200)
HDL: 57.6 mg/dL (ref >39.00)
LDL Cholesterol: 66 mg/dL (ref 0–99)
NonHDL: 95.47
Total CHOL/HDL Ratio: 3
Triglycerides: 146 mg/dL (ref 0.0–149.0)
VLDL: 29.2 mg/dL (ref 0.0–40.0)

## 2021-11-08 LAB — HEMOGLOBIN A1C: Hgb A1c MFr Bld: 8.4 % — ABNORMAL HIGH (ref 4.6–6.5)

## 2021-11-10 ENCOUNTER — Ambulatory Visit (INDEPENDENT_AMBULATORY_CARE_PROVIDER_SITE_OTHER): Payer: Commercial Managed Care - HMO | Admitting: Endocrinology

## 2021-11-10 ENCOUNTER — Encounter: Payer: Self-pay | Admitting: Endocrinology

## 2021-11-10 VITALS — BP 138/90 | HR 81 | Ht 75.25 in | Wt 277.0 lb

## 2021-11-10 DIAGNOSIS — E1165 Type 2 diabetes mellitus with hyperglycemia: Secondary | ICD-10-CM

## 2021-11-10 DIAGNOSIS — E782 Mixed hyperlipidemia: Secondary | ICD-10-CM

## 2021-11-10 DIAGNOSIS — I10 Essential (primary) hypertension: Secondary | ICD-10-CM

## 2021-11-10 DIAGNOSIS — Z794 Long term (current) use of insulin: Secondary | ICD-10-CM

## 2021-11-10 NOTE — Progress Notes (Signed)
Reason for Appointment: Follow-up for Type 2 Diabetes  Referring physician: Ehinger   History of Present Illness:          Date of diagnosis of type 2 diabetes mellitus:?  1997        Background history:   He was symptomatic at diagnosis with increased urination He has been mostly treated with metformin and glipizide in the past He was taking Januvia more recently but this was changed in 2017 2 Onglyza because of insurance preference He does not think his sugars have been consistently controlled in the past, best A1c recently appears to be in 05/2014 which was 7.5 but subsequently has been at least 8%  Recent history:   BASAGLAR insulin: 24 units daily  Non-insulin hypoglycemic drugs: Metformin ER 500, 4 tablets in a.m., Farxiga 10 mg, Trulicity 4.5 mg weekly  A1c has been consistently over 8 and now 8.4  Fructosamine previously 281  Current management, blood sugar patterns and problems identified: He has been checking his blood sugars very infrequently Also has been inconsistent with his home glucose monitoring as seen below He is probably missing his insulin once a week He thinks he takes Trulicity consistently weekly Also diet can be variable including snacks at night or starchy foods Difficult to get him to lose weight which is down only about 4 pounds  He again refuses CGM       Side effects from medications have been: Periodic balanitis from Jardiance  Compliance with the medical regimen: Inconsistent  Glucose monitoring: One Touch Verio  RECENT blood sugars: Has only 4 readings, morning 110, 135 and afternoon 113, 119  Previously: FASTING 128-153 with only 3 readings Bedtime 124, 126 the last 2 days   Self-care: The diet that the patient has been following is: tries to limit fast food usually .      Meal times are:  Breakfast is at 9 AM-12 pm Lunch: 2-4 PM Dinner:  6-8 PM              Dietician visit, most recent: 9/17  Weight history:   He  had a gastric band in 2009 but this was loosened a couple of years ago because with taking metformin he would get uncomfortably bloated and has gained weight  Wt Readings from Last 3 Encounters:  11/10/21 277 lb (125.6 kg)  08/04/21 281 lb (127.5 kg)  05/05/21 280 lb 9.6 oz (127.3 kg)    Glycemic control:    Lab Results  Component Value Date   HGBA1C 8.4 (H) 11/08/2021   HGBA1C 8.7 (H) 08/01/2021   HGBA1C 8.5 (H) 04/29/2021   Lab Results  Component Value Date   MICROALBUR <0.7 08/01/2021   LDLCALC 66 11/08/2021   CREATININE 0.96 11/08/2021   Lab Results  Component Value Date   MICRALBCREAT 0.6 08/01/2021    Lab on 11/08/2021  Component Date Value Ref Range Status   Cholesterol 11/08/2021 153  0 - 200 mg/dL Final   ATP III Classification       Desirable:  < 200 mg/dL               Borderline High:  200 - 239 mg/dL          High:  > = 240 mg/dL   Triglycerides 11/08/2021 146.0  0.0 - 149.0 mg/dL Final   Normal:  <150 mg/dLBorderline High:  150 - 199 mg/dL   HDL 11/08/2021 57.60  >39.00 mg/dL  Final   VLDL 11/08/2021 29.2  0.0 - 40.0 mg/dL Final   LDL Cholesterol 11/08/2021 66  0 - 99 mg/dL Final   Total CHOL/HDL Ratio 11/08/2021 3   Final                  Men          Women1/2 Average Risk     3.4          3.3Average Risk          5.0          4.42X Average Risk          9.6          7.13X Average Risk          15.0          11.0                       NonHDL 11/08/2021 95.47   Final   NOTE:  Non-HDL goal should be 30 mg/dL higher than patient's LDL goal (i.e. LDL goal of < 70 mg/dL, would have non-HDL goal of < 100 mg/dL)   Sodium 11/08/2021 141  135 - 145 mEq/L Final   Potassium 11/08/2021 4.5  3.5 - 5.1 mEq/L Final   Chloride 11/08/2021 100  96 - 112 mEq/L Final   CO2 11/08/2021 34 (H)  19 - 32 mEq/L Final   Glucose, Bld 11/08/2021 146 (H)  70 - 99 mg/dL Final   BUN 11/08/2021 9  6 - 23 mg/dL Final   Creatinine, Ser 11/08/2021 0.96  0.40 - 1.50 mg/dL Final   Total  Bilirubin 11/08/2021 0.6  0.2 - 1.2 mg/dL Final   Alkaline Phosphatase 11/08/2021 90  39 - 117 U/L Final   AST 11/08/2021 21  0 - 37 U/L Final   ALT 11/08/2021 17  0 - 53 U/L Final   Total Protein 11/08/2021 8.0  6.0 - 8.3 g/dL Final   Albumin 11/08/2021 4.5  3.5 - 5.2 g/dL Final   GFR 11/08/2021 88.69  >60.00 mL/min Final   Calculated using the CKD-EPI Creatinine Equation (2021)   Calcium 11/08/2021 10.3  8.4 - 10.5 mg/dL Final   Hgb A1c MFr Bld 11/08/2021 8.4 (H)  4.6 - 6.5 % Final   Glycemic Control Guidelines for People with Diabetes:Non Diabetic:  <6%Goal of Therapy: <7%Additional Action Suggested:  >8%      Allergies as of 11/10/2021   No Known Allergies      Medication List        Accurate as of November 10, 2021 11:59 PM. If you have any questions, ask your nurse or doctor.          STOP taking these medications    tirzepatide 5 MG/0.5ML Pen Commonly known as: MOUNJARO Stopped by: Elayne Snare, MD       TAKE these medications    amLODipine 10 MG tablet Commonly known as: NORVASC Take 10 mg by mouth daily.   atorvastatin 80 MG tablet Commonly known as: LIPITOR Take 80 mg by mouth daily.   Basaglar KwikPen 100 UNIT/ML inject 24 units into the skin once daily.   Contour Next Test test strip Generic drug: glucose blood Use as instructed   OneTouch Verio test strip Generic drug: glucose blood USE TO CHECK  BLOOD SUGAR 2 TIMES DAILY AS INSTRUCTED   Farxiga 10 MG Tabs tablet Generic drug: dapagliflozin propanediol TAKE ONE TABLET BY MOUTH ONE TIME  DAILY BEFORE BREAKFAST   metFORMIN 500 MG 24 hr tablet Commonly known as: GLUCOPHAGE-XR Take 4 tablets (2,000 mg total) by mouth daily with supper. What changed: additional instructions   metoprolol succinate 100 MG 24 hr tablet Commonly known as: TOPROL-XL Take 100 mg by mouth daily.   multivitamin-iron-minerals-folic acid chewable tablet Chew 1 tablet by mouth daily.   nystatin cream Commonly known  as: MYCOSTATIN Apply 1 application topically 2 (two) times daily.   OneTouch Delica Plus [id number] Misc Use to check blood sugar 2 times a day   OneTouch Verio Flex System w/Device Kit Use to check blood sugar 2 times a day   telmisartan-hydrochlorothiazide 80-25 MG tablet Commonly known as: MICARDIS HCT Take 1 tablet by mouth daily.   Tyler Aas FlexTouch 100 UNIT/ML FlexTouch Pen Generic drug: insulin degludec Inject 0.24 mls (24 units) into the skin once a day   Trulicity 4.5 SA/[id number] Sopn Generic drug: Dulaglutide Inject 4.51m weekly        Allergies: No Known Allergies  Past Medical History:  Diagnosis Date   Diabetes mellitus    Hyperlipidemia    Hypertension     Past Surgical History:  Procedure Laterality Date   LAPAROSCOPIC GASTRIC BANDING  01/29/2007    No family history on file.  Social History:  reports that he has never smoked. He has never used smokeless tobacco. He reports current alcohol use of about 5.0 standard drinks of alcohol per week. He reports that he does not use drugs.   Review of Systems   Lipid history:  LDL is variable but usually below 100 He has been continued on Lipitor 80 mg from PCP   . Lab Results  Component Value Date   CHOL 153 11/08/2021   CHOL 135 09/12/2019   CHOL 164 07/10/2019   Lab Results  Component Value Date   HDL 57.60 11/08/2021   HDL 48.40 09/12/2019   HDL 37.80 (L) 07/10/2019   Lab Results  Component Value Date   LDLCALC 66 11/08/2021   LDLCALC 70 09/12/2019   LDLCALC 63 10/16/2018   Lab Results  Component Value Date   TRIG 146.0 11/08/2021   TRIG 82.0 09/12/2019   TRIG (H) 07/10/2019    405.0 Triglyceride is over 400; calculations on Lipids are invalid.   Lab Results  Component Value Date   CHOLHDL 3 11/08/2021   CHOLHDL 3 09/12/2019   CHOLHDL 4 07/10/2019   Lab Results  Component Value Date   LDLDIRECT 99.0 04/29/2021   LDLDIRECT 78.0 07/10/2019   LDLDIRECT 71.0 06/20/2016             Hypertension: This is followed by PCP  Is on Micardis 80/25 HCT and amlodipine 10 mg  BP Readings from Last 3 Encounters:  11/10/21 (!) 138/90  08/04/21 108/78  05/05/21 138/84    Most recent eye exam was in 01/2021  Most recent foot exam: 1/21  He has sleep apnea and using the CPAP  LABS:  Lab on 11/08/2021  Component Date Value Ref Range Status   Cholesterol 11/08/2021 153  0 - 200 mg/dL Final   ATP III Classification       Desirable:  < 200 mg/dL               Borderline High:  200 - 239 mg/dL          High:  > = 240 mg/dL   Triglycerides 11/08/2021 146.0  0.0 - 149.0 mg/dL Final   Normal:  <150 mg/dLBorderline  High:  150 - 199 mg/dL   HDL 11/08/2021 57.60  >39.00 mg/dL Final   VLDL 11/08/2021 29.2  0.0 - 40.0 mg/dL Final   LDL Cholesterol 11/08/2021 66  0 - 99 mg/dL Final   Total CHOL/HDL Ratio 11/08/2021 3   Final                  Men          Women1/2 Average Risk     3.4          3.3Average Risk          5.0          4.42X Average Risk          9.6          7.13X Average Risk          15.0          11.0                       NonHDL 11/08/2021 95.47   Final   NOTE:  Non-HDL goal should be 30 mg/dL higher than patient's LDL goal (i.e. LDL goal of < 70 mg/dL, would have non-HDL goal of < 100 mg/dL)   Sodium 11/08/2021 141  135 - 145 mEq/L Final   Potassium 11/08/2021 4.5  3.5 - 5.1 mEq/L Final   Chloride 11/08/2021 100  96 - 112 mEq/L Final   CO2 11/08/2021 34 (H)  19 - 32 mEq/L Final   Glucose, Bld 11/08/2021 146 (H)  70 - 99 mg/dL Final   BUN 11/08/2021 9  6 - 23 mg/dL Final   Creatinine, Ser 11/08/2021 0.96  0.40 - 1.50 mg/dL Final   Total Bilirubin 11/08/2021 0.6  0.2 - 1.2 mg/dL Final   Alkaline Phosphatase 11/08/2021 90  39 - 117 U/L Final   AST 11/08/2021 21  0 - 37 U/L Final   ALT 11/08/2021 17  0 - 53 U/L Final   Total Protein 11/08/2021 8.0  6.0 - 8.3 g/dL Final   Albumin 11/08/2021 4.5  3.5 - 5.2 g/dL Final   GFR 11/08/2021 88.69  >60.00 mL/min Final    Calculated using the CKD-EPI Creatinine Equation (2021)   Calcium 11/08/2021 10.3  8.4 - 10.5 mg/dL Final   Hgb A1c MFr Bld 11/08/2021 8.4 (H)  4.6 - 6.5 % Final   Glycemic Control Guidelines for People with Diabetes:Non Diabetic:  <6%Goal of Therapy: <7%Additional Action Suggested:  >8%      Physical Examination:  BP (!) 138/90   Pulse 81   Ht 6' 3.25" (1.911 m)   Wt 277 lb (125.6 kg)   SpO2 96%   BMI 34.39 kg/m   Diabetic Foot Exam - Simple   Simple Foot Form Diabetic Foot exam was performed with the following findings: Yes   Visual Inspection No deformities, no ulcerations, no other skin breakdown bilaterally: Yes Sensation Testing Intact to touch and monofilament testing bilaterally: Yes Pulse Check Posterior Tibialis and Dorsalis pulse intact bilaterally: Yes Comments       ASSESSMENT: 7/23  Diabetes type 2, uncontrolled, with BMI 34  See history of present illness for detailed discussion of current diabetes management, blood sugar patterns and problems identified  His A1c is at the same at 8.4  A1c is continuing to be increasing and lowest in the last year about 8.1 only  Current regimen is basal insulin, Farxiga and Trulicity 4.5 mg  Again has not  made any efforts to check his blood sugars consistently, exercise and has only minimal weight change Although he did better with Mounjaro as a trial he is not able to get this covered again with current insurance As before he refuses to consider CGM both Dexcom and libre  Hypertension: Blood pressure is high but he thinks this is from anxiety He will follow-up with his PCP  Lipids: Well-controlled with LDL 66  PLAN:    He needs to check his blood sugars at least once a day at different time  No change in [place] unless morning sugars are consistently high  Regular exercise at least 3 to 4 days a week  If his blood sugars are higher after dinner he will let us know but likely needs to just be consistent on  his diet Continue statin drugs  Follow-up in 3 months  There are no Patient Instructions on file for this visit.   Elayne Snare 11/11/2021, 3:48 PM   Note: This office note was prepared with Dragon voice recognition system technology. Any transcriptional errors that result from this process are unintentional.

## 2021-11-12 ENCOUNTER — Other Ambulatory Visit: Payer: Self-pay | Admitting: Endocrinology

## 2021-11-12 DIAGNOSIS — E1165 Type 2 diabetes mellitus with hyperglycemia: Secondary | ICD-10-CM

## 2022-01-12 ENCOUNTER — Other Ambulatory Visit: Payer: Self-pay | Admitting: Endocrinology

## 2022-02-11 ENCOUNTER — Other Ambulatory Visit: Payer: Self-pay | Admitting: Endocrinology

## 2022-02-11 DIAGNOSIS — E1165 Type 2 diabetes mellitus with hyperglycemia: Secondary | ICD-10-CM

## 2022-03-14 ENCOUNTER — Other Ambulatory Visit (INDEPENDENT_AMBULATORY_CARE_PROVIDER_SITE_OTHER): Payer: Commercial Managed Care - HMO

## 2022-03-14 DIAGNOSIS — E1165 Type 2 diabetes mellitus with hyperglycemia: Secondary | ICD-10-CM | POA: Diagnosis not present

## 2022-03-14 DIAGNOSIS — Z794 Long term (current) use of insulin: Secondary | ICD-10-CM | POA: Diagnosis not present

## 2022-03-14 LAB — BASIC METABOLIC PANEL
BUN: 11 mg/dL (ref 6–23)
CO2: 31 mEq/L (ref 19–32)
Calcium: 9.9 mg/dL (ref 8.4–10.5)
Chloride: 100 mEq/L (ref 96–112)
Creatinine, Ser: 0.93 mg/dL (ref 0.40–1.50)
GFR: 91.91 mL/min (ref >60.00)
Glucose, Bld: 146 mg/dL — ABNORMAL HIGH (ref 70–99)
Potassium: 4.1 mEq/L (ref 3.5–5.1)
Sodium: 140 mEq/L (ref 135–145)

## 2022-03-14 LAB — HEMOGLOBIN A1C: Hgb A1c MFr Bld: 8.8 % — ABNORMAL HIGH (ref 4.6–6.5)

## 2022-03-20 ENCOUNTER — Other Ambulatory Visit: Payer: Self-pay

## 2022-03-20 ENCOUNTER — Ambulatory Visit: Payer: Commercial Managed Care - HMO | Admitting: Endocrinology

## 2022-03-20 ENCOUNTER — Encounter: Payer: Self-pay | Admitting: Endocrinology

## 2022-03-20 VITALS — BP 108/68 | HR 75 | Ht 75.25 in | Wt 278.8 lb

## 2022-03-20 DIAGNOSIS — E1165 Type 2 diabetes mellitus with hyperglycemia: Secondary | ICD-10-CM

## 2022-03-20 DIAGNOSIS — Z794 Long term (current) use of insulin: Secondary | ICD-10-CM

## 2022-03-20 MED ORDER — INSULIN PEN NEEDLE 31G X 5 MM MISC: 1 refills | Status: DC

## 2022-03-20 NOTE — Patient Instructions (Signed)
Check blood sugars on waking up 3 days a week ° °Also check blood sugars about 2 hours after meals and do this after different meals by rotation ° °Recommended blood sugar levels on waking up are 90-130 and about 2 hours after meal is 130-180 ° °Please bring your blood sugar monitor to each visit, thank you ° °

## 2022-03-20 NOTE — Progress Notes (Addendum)
Reason for Appointment: Follow-up for Type 2 Diabetes  Referring physician: Ehinger   History of Present Illness:          Date of diagnosis of type 2 diabetes mellitus:?  1997        Background history:   He was symptomatic at diagnosis with increased urination He has been mostly treated with metformin and glipizide in the past He was taking Januvia more recently but this was changed in 2017 2 Onglyza because of insurance preference He does not think his sugars have been consistently controlled in the past, best A1c recently appears to be in 05/2014 which was 7.5 but subsequently has been at least 8%  Recent history:   BASAGLAR insulin: 24 units daily  Non-insulin hypoglycemic drugs: Metformin ER 500, 4 tablets in a.m., Farxiga 10 mg, Trulicity 4.5 mg weekly  A1c has been consistently over 8 and now 8.8  Fructosamine previously 281  Current management, blood sugar patterns and problems identified: He has not lost any weight Only recently has started doing a little biking up to 45 minutes, twice a week Currently not watching diet with excess snacks at night Also he thinks he is drinking more alcohol as well as regular soft drinks  He only occasionally will check his blood sugar in the morning and only once in the evenings, most of his fasting blood sugars seem to be averaging about 150 but lowest 126 As before medical Misses insulin doses He again refuses to consider 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      Meal times are:  Breakfast is at 9 AM-12 pm Lunch: 2-4 PM Dinner:  6-8 PM              Dietician visit, most recent: 9/17  Weight history:   He had a gastric band in 2009 but this was loosened a couple of years ago because with taking metformin he would get uncomfortably bloated and has gained weight  Wt Readings from Last 3 Encounters:  03/20/22 278 lb 12.8 oz (126.5 kg)  11/10/21 277 lb (125.6 kg)  08/04/21 281 lb (127.5 kg)    Glycemic control:    Lab Results  Component Value Date   HGBA1C 8.8 (H) 03/14/2022   HGBA1C 8.4 (H) 11/08/2021   HGBA1C 8.7 (H) 08/01/2021   Lab Results  Component Value Date   MICROALBUR <0.7 08/01/2021   LDLCALC 66 11/08/2021   CREATININE 0.93 03/14/2022   Lab Results  Component Value Date   MICRALBCREAT 0.6 08/01/2021    Lab on 03/14/2022  Component Date Value Ref Range Status   Sodium 03/14/2022 140  135 - 145 mEq/L Final   Potassium 03/14/2022 4.1  3.5 - 5.1 mEq/L Final   Chloride 03/14/2022 100  96 - 112 mEq/L Final   CO2 03/14/2022 31  19 - 32 mEq/L Final   Glucose, Bld 03/14/2022 146 (H)  70 - 99 mg/dL Final   BUN 03/14/2022 11  6 - 23 mg/dL Final   Creatinine, Ser 03/14/2022 0.93  0.40 - 1.50 mg/dL Final   GFR 03/14/2022 91.91  >60.00 mL/min Final   Calculated using the CKD-EPI Creatinine Equation (2021)   Calcium 03/14/2022 9.9  8.4 - 10.5 mg/dL Final   Hgb A1c MFr Bld 03/14/2022 8.8 (H)  4.6 - 6.5 % Final   Glycemic Control Guidelines for People with Diabetes:Non Diabetic:  <6%Goal of Therapy: <7%Additional Action Suggested:  >8%      Allergies as of 03/20/2022   No Known Allergies      Medication List        Accurate as of March 20, 2022  1:19 PM. If you have any questions, ask your nurse or doctor.          amLODipine 10 MG tablet Commonly known as: NORVASC Take 10 mg by mouth daily.   atorvastatin 80 MG tablet Commonly known as: LIPITOR Take 80 mg by  mouth daily.   Basaglar KwikPen 100 UNIT/ML inject 24 units into the skin once daily.   Contour Next Test test strip Generic drug: glucose blood Use as instructed   OneTouch Verio test strip Generic drug: glucose blood USE TO CHECK BLOOD SUGAR TWO TIMES A DAY AS INSTRUCTED   Farxiga 10 MG Tabs tablet Generic drug: dapagliflozin propanediol TAKE ONE TABLET BY MOUTH ONE TIME DAILY BEFORE BREAKFAST   metFORMIN 500 MG 24 hr tablet Commonly known as: GLUCOPHAGE-XR Take 4 tablets (2,000 mg total) by mouth daily with supper. What changed: additional instructions   metoprolol succinate 100 MG 24 hr tablet Commonly known as: TOPROL-XL Take 100 mg by mouth daily.   multivitamin-iron-minerals-folic acid chewable tablet Chew 1 tablet by mouth daily.   nystatin cream Commonly known as: MYCOSTATIN Apply 1 application topically 2 (two) times daily.   OneTouch Delica Plus 0000000 Misc Use to check blood sugar 2 times a day   OneTouch Verio Flex System w/Device Kit Use to check blood sugar 2 times a day   telmisartan-hydrochlorothiazide 80-25 MG tablet Commonly known as: MICARDIS HCT Take 1 tablet by mouth daily.   Tyler Aas FlexTouch 100 UNIT/ML FlexTouch Pen Generic drug: insulin degludec Inject 0.24 mls (24 units) into the skin once a day   Trulicity 4.5 0000000 Sopn Generic drug: Dulaglutide INJECT 4.'5MG'$ (0.5ML) INTO THE SKIN ONCE WEEKLY        Allergies: No Known Allergies  Past Medical History:  Diagnosis Date   Diabetes mellitus    Hyperlipidemia    Hypertension     Past Surgical History:  Procedure Laterality Date   LAPAROSCOPIC GASTRIC BANDING  01/29/2007    No family history on file.  Social History:  reports that he has never smoked. He has never used smokeless tobacco. He reports current alcohol use of about 5.0 standard drinks of alcohol per week. He reports that he does not use drugs.   Review of Systems   Lipid history:  LDL is variable but  usually below 100 He has been continued on Lipitor 80 mg from PCP   . Lab Results  Component Value Date   CHOL 153 11/08/2021   CHOL 135 09/12/2019   CHOL 164 07/10/2019   Lab Results  Component Value Date   HDL 57.60 11/08/2021   HDL 48.40 09/12/2019   HDL 37.80 (L) 07/10/2019   Lab Results  Component Value Date   LDLCALC 66 11/08/2021   LDLCALC 70 09/12/2019  LDLCALC 63 10/16/2018   Lab Results  Component Value Date   TRIG 146.0 11/08/2021   TRIG 82.0 09/12/2019   TRIG (H) 07/10/2019    405.0 Triglyceride is over 400; calculations on Lipids are invalid.   Lab Results  Component Value Date   CHOLHDL 3 11/08/2021   CHOLHDL 3 09/12/2019   CHOLHDL 4 07/10/2019   Lab Results  Component Value Date   LDLDIRECT 99.0 04/29/2021   LDLDIRECT 78.0 07/10/2019   LDLDIRECT 71.0 06/20/2016            Hypertension: This is followed by PCP  Is on Micardis 80/25 HCT and amlodipine 10 mg No complaints of lightheadedness  BP Readings from Last 3 Encounters:  03/20/22 108/68  11/10/21 (!) 138/90  08/04/21 108/78    Most recent eye exam was in 01/2021  Most recent foot exam: 1/21  He has sleep apnea and using the CPAP  LABS:  Lab on 03/14/2022  Component Date Value Ref Range Status   Sodium 03/14/2022 140  135 - 145 mEq/L Final   Potassium 03/14/2022 4.1  3.5 - 5.1 mEq/L Final   Chloride 03/14/2022 100  96 - 112 mEq/L Final   CO2 03/14/2022 31  19 - 32 mEq/L Final   Glucose, Bld 03/14/2022 146 (H)  70 - 99 mg/dL Final   BUN 21/30/8657 11  6 - 23 mg/dL Final   Creatinine, Ser 03/14/2022 0.93  0.40 - 1.50 mg/dL Final   GFR 84/69/6295 91.91  >60.00 mL/min Final   Calculated using the CKD-EPI Creatinine Equation (2021)   Calcium 03/14/2022 9.9  8.4 - 10.5 mg/dL Final   Hgb M8U MFr Bld 03/14/2022 8.8 (H)  4.6 - 6.5 % Final   Glycemic Control Guidelines for People with Diabetes:Non Diabetic:  <6%Goal of Therapy: <7%Additional Action Suggested:  >8%      Physical  Examination:  BP 108/68 (BP Location: Left Arm, Patient Position: Sitting, Cuff Size: Normal)   Pulse 75   Ht 6' 3.25" (1.911 m)   Wt 278 lb 12.8 oz (126.5 kg)   SpO2 95%   BMI 34.62 kg/m      ASSESSMENT: 7/23  Diabetes type 2, uncontrolled, with BMI 34  See history of present illness for detailed discussion of current diabetes management, blood sugar patterns and problems identified  His A1c is 8.8 compared to 8.7  A1c is consistently about the same and has not come down below 8 since 2023  Current regimen is basal insulin, Farxiga and Trulicity 4.5 mg  He says that he has not done well with his diet and exercise regimen lately   Any efforts to check his blood sugars consistently, exercise and has only minimal weight change Although he did better with Mounjaro as a trial this appears to be not covered again Will try to get a PA done for this  Again he refuses to consider CGM both Dexcom and libre 3 as a trial with the sample for 10 to 14 days  Hypertension: Blood pressure is well-controlled  He will follow-up with his PCP   PLAN:    He needs to check his blood sugars consistently at least 2 to 3 hours after supper or at least at bedtime to see if he can modify his diet better with this He will try to increase his exercise bike to at least 5 days a week Cut back on snacks, excess alcohol and all regular soft drinks  No change in Basaglar dosage since lowest fasting  blood sugar was 126 at home Discussed that if he is not able to get better compliance and better results on the next visit will put a trial sample of either Dexcom or freestyle libre on the next visit  Follow-up in 3 months  There are no Patient Instructions on file for this visit.   Reather Littler 03/20/2022, 1:19 PM   Note: This office note was prepared with Dragon voice recognition system technology. Any transcriptional errors that result from this process are unintentional.

## 2022-03-21 ENCOUNTER — Other Ambulatory Visit: Payer: Self-pay | Admitting: Endocrinology

## 2022-03-21 DIAGNOSIS — E1165 Type 2 diabetes mellitus with hyperglycemia: Secondary | ICD-10-CM

## 2022-04-12 ENCOUNTER — Other Ambulatory Visit: Payer: Self-pay | Admitting: Endocrinology

## 2022-04-12 DIAGNOSIS — E1165 Type 2 diabetes mellitus with hyperglycemia: Secondary | ICD-10-CM

## 2022-04-28 ENCOUNTER — Other Ambulatory Visit: Payer: Self-pay | Admitting: Endocrinology

## 2022-04-28 DIAGNOSIS — E1165 Type 2 diabetes mellitus with hyperglycemia: Secondary | ICD-10-CM

## 2022-05-10 ENCOUNTER — Other Ambulatory Visit: Payer: Self-pay | Admitting: Endocrinology

## 2022-05-10 ENCOUNTER — Other Ambulatory Visit (HOSPITAL_COMMUNITY): Payer: Self-pay

## 2022-05-10 DIAGNOSIS — E1165 Type 2 diabetes mellitus with hyperglycemia: Secondary | ICD-10-CM

## 2022-05-11 ENCOUNTER — Telehealth: Payer: Self-pay | Admitting: Endocrinology

## 2022-05-11 NOTE — Telephone Encounter (Signed)
Patient dropped off [place] DMV paper work for completion. Placed in MD box at front desk

## 2022-05-12 NOTE — Telephone Encounter (Signed)
Placed in provider box

## 2022-05-16 NOTE — Telephone Encounter (Signed)
Forms completed. Patient aware they will be at front desk for pick up. Copy sent to scan.

## 2022-06-05 ENCOUNTER — Other Ambulatory Visit: Payer: Self-pay | Admitting: Endocrinology

## 2022-06-05 DIAGNOSIS — E1165 Type 2 diabetes mellitus with hyperglycemia: Secondary | ICD-10-CM

## 2022-06-06 ENCOUNTER — Other Ambulatory Visit (HOSPITAL_COMMUNITY): Payer: Self-pay

## 2022-06-22 ENCOUNTER — Other Ambulatory Visit (INDEPENDENT_AMBULATORY_CARE_PROVIDER_SITE_OTHER): Payer: Commercial Managed Care - HMO

## 2022-06-22 ENCOUNTER — Ambulatory Visit (INDEPENDENT_AMBULATORY_CARE_PROVIDER_SITE_OTHER): Payer: Commercial Managed Care - HMO | Admitting: Podiatry

## 2022-06-22 DIAGNOSIS — M216X2 Other acquired deformities of left foot: Secondary | ICD-10-CM

## 2022-06-22 DIAGNOSIS — E1142 Type 2 diabetes mellitus with diabetic polyneuropathy: Secondary | ICD-10-CM | POA: Diagnosis not present

## 2022-06-22 DIAGNOSIS — M216X1 Other acquired deformities of right foot: Secondary | ICD-10-CM | POA: Diagnosis not present

## 2022-06-22 DIAGNOSIS — E1165 Type 2 diabetes mellitus with hyperglycemia: Secondary | ICD-10-CM

## 2022-06-22 DIAGNOSIS — Z794 Long term (current) use of insulin: Secondary | ICD-10-CM | POA: Diagnosis not present

## 2022-06-22 DIAGNOSIS — M2142 Flat foot [pes planus] (acquired), left foot: Secondary | ICD-10-CM | POA: Diagnosis not present

## 2022-06-22 DIAGNOSIS — M2141 Flat foot [pes planus] (acquired), right foot: Secondary | ICD-10-CM | POA: Diagnosis not present

## 2022-06-22 LAB — BASIC METABOLIC PANEL
BUN: 11 mg/dL (ref 6–23)
CO2: 34 mEq/L — ABNORMAL HIGH (ref 19–32)
Calcium: 9.6 mg/dL (ref 8.4–10.5)
Chloride: 100 mEq/L (ref 96–112)
Creatinine, Ser: 1.03 mg/dL (ref 0.40–1.50)
GFR: 81.16 mL/min (ref >60.00)
Glucose, Bld: 140 mg/dL — ABNORMAL HIGH (ref 70–99)
Potassium: 4.3 mEq/L (ref 3.5–5.1)
Sodium: 142 mEq/L (ref 135–145)

## 2022-06-22 LAB — MICROALBUMIN / CREATININE URINE RATIO
Creatinine,U: 171.8 mg/dL
Microalb Creat Ratio: 0.4 mg/g (ref 0.0–30.0)
Microalb, Ur: <0.7 mg/dL (ref 0.0–1.9)

## 2022-06-22 LAB — HEMOGLOBIN A1C: Hgb A1c MFr Bld: 8.7 % — ABNORMAL HIGH (ref 4.6–6.5)

## 2022-06-22 NOTE — Progress Notes (Signed)
  Subjective:  Patient ID: Gregory Brady, male    DOB: January 16, 1966,  MRN: [id number]  Chief Complaint  Patient presents with   Foot Orthotics    Rm 15 Orthotic Order. Pt states last pair where from the 90's. Feet are flat and make foot and shoes go inward.  Size 15 Wt 270 Ht 6 31/2     57 y.o. male presents for concern for bilateral flatfoot deformity.  Hoping to get new custom made orthotics purchased.  He does have a history of diabetes.  He says he has previously had some good feet orthotics in the past these were very helpful however they are very old and worn down.  Past Medical History:  Diagnosis Date   Diabetes mellitus    Hyperlipidemia    Hypertension     No Known Allergies  ROS: Negative except as per HPI above  Objective:  General: AAO x3, NAD  Dermatological: With inspection and palpation of the right and left lower extremities there are no open sores, no preulcerative lesions, no rash or signs of infection present. Nails are of normal length thickness and coloration.   Vascular:  Dorsalis Pedis artery and Posterior Tibial artery pedal pulses are 2/4 bilateral.  Capillary fill time < 3 sec to all digits.   Neruologic: Grossly intact via light touch bilateral. Protective threshold intact to all sites bilateral.   Musculoskeletal: Pes planus foot deformity is noted bilateral foot.  There is moderate hallux abductovalgus deformity noted.  Gait: Unassisted, Nonantalgic.   No images are attached to the encounter.  Radiographs:  Deferred at this visit Assessment:   1. Pes planus of both feet   2. DM type 2 with diabetic peripheral neuropathy (HCC)      Plan:  Patient was evaluated and treated and all questions answered.  # Pes planus foot deformity bilateral -Discussed with patient that he does have flatfoot deformity. -Likely some component of posterior tibial tendon dysfunction -Recommend good full-length custom made orthotics for medial arch support.   Patient agreeable. -Patient was casted for custom made orthotics at this visit.  We will call him when they are ready to be dispensed         Corinna Gab, DPM [facility] / [facility]

## 2022-06-26 ENCOUNTER — Ambulatory Visit (INDEPENDENT_AMBULATORY_CARE_PROVIDER_SITE_OTHER): Payer: Commercial Managed Care - HMO | Admitting: Endocrinology

## 2022-06-26 ENCOUNTER — Encounter: Payer: Self-pay | Admitting: Endocrinology

## 2022-06-26 VITALS — BP 118/72 | HR 79 | Ht 75.25 in | Wt 276.0 lb

## 2022-06-26 DIAGNOSIS — E1165 Type 2 diabetes mellitus with hyperglycemia: Secondary | ICD-10-CM

## 2022-06-26 DIAGNOSIS — Z794 Long term (current) use of insulin: Secondary | ICD-10-CM | POA: Diagnosis not present

## 2022-06-26 MED ORDER — TIRZEPATIDE 5 MG/0.5ML ~~LOC~~ SOAJ: 5.0000 mg | SUBCUTANEOUS | 0 refills | Status: DC

## 2022-06-26 NOTE — Progress Notes (Signed)
Reason for Appointment: Follow-up for Type 2 Diabetes  Referring physician: Ehinger   History of Present Illness:          Date of diagnosis of type 2 diabetes mellitus:?  1997        Background history:   He was symptomatic at diagnosis with increased urination He has been mostly treated with metformin and glipizide in the past He was taking Januvia more recently but this was changed in 2017 2 Onglyza because of insurance preference He does not think his sugars have been consistently controlled in the past, best A1c recently appears to be in 05/2014 which was 7.5 but subsequently has been at least 8%  Recent history:   BASAGLAR insulin: 24 units daily  Non-insulin hypoglycemic drugs: Metformin ER 500, 4 tablets in a.m., Farxiga 10 mg, (Trulicity 4.5 mg weekly)  A1c has been consistently over 8 and now 8.7  Fructosamine previously 303 in 2022  Current management, blood sugar patterns and problems identified: He has not been able to get his Trulicity because of lack of availability at the pharmacy for the last 6 weeks However he appears to be trying better with improved diet and cutting back on sweets and portion However he will still have at least 1 or 2  regular soft drinks daily He has checked a few readings sporadically about 2 hours after dinner but they appear to be normal  Has a dawn phenomenon since his blood sugar was 140 in the lab but 113 that same morning on waking up Has not been able to exercise for the last month because of foot instability However her weight is about the same despite not using Trulicity He has taken his [place] regularly reportedly He has not missed insulin doses reportedly 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      Meal times are:  Breakfast is at 9 AM-12 pm Lunch: 2-4 PM Dinner:  6-8 PM              Dietician visit, most recent: 9/17  Weight history:   He had a gastric band in 2009 but this was loosened a couple of years ago because with taking metformin he would get uncomfortably bloated and has gained weight  Wt Readings from Last 3 Encounters:  06/26/22 276 lb (125.2 kg)  03/20/22 278 lb 12.8 oz (126.5 kg)  11/10/21 277 lb (125.6 kg)    Glycemic control:    Lab Results  Component Value Date   HGBA1C 8.7 (H) 06/22/2022   HGBA1C 8.8 (H) 03/14/2022   HGBA1C 8.4 (H) 11/08/2021   Lab Results  Component Value Date   MICROALBUR <0.7 06/22/2022   LDLCALC 66 11/08/2021   CREATININE 1.03 06/22/2022   Lab Results  Component Value Date   MICRALBCREAT 0.4 06/22/2022    Lab on 06/22/2022  Component Date Value Ref Range Status   Microalb, Ur 06/22/2022 <0.7  0.0 - 1.9 mg/dL Final  Creatinine,U 06/22/2022 171.8  mg/dL Final   Microalb Creat Ratio 06/22/2022 0.4  0.0 - 30.0 mg/g Final   Sodium 06/22/2022 142  135 - 145 mEq/L Final   Potassium 06/22/2022 4.3  3.5 - 5.1 mEq/L Final   Chloride 06/22/2022 100  96 - 112 mEq/L Final   CO2 06/22/2022 34 (H)  19 - 32 mEq/L Final   Glucose, Bld 06/22/2022 140 (H)  70 - 99 mg/dL Final   BUN 16/10/9602 11  6 - 23 mg/dL Final   Creatinine, Ser 06/22/2022 1.03  0.40 - 1.50 mg/dL Final   GFR 54/09/8117 81.16  >60.00 mL/min Final   Calculated using the CKD-EPI Creatinine Equation (2021)   Calcium 06/22/2022 9.6  8.4 - 10.5 mg/dL Final   Hgb J4N MFr Bld 06/22/2022 8.7 (H)  4.6 - 6.5 % Final   Glycemic Control Guidelines for People with Diabetes:Non  Diabetic:  <6%Goal of Therapy: <7%Additional Action Suggested:  >8%      Allergies as of 06/26/2022       Reactions   Ramipril Cough        Medication List        Accurate as of June 26, 2022  1:42 PM. If you have any questions, ask your nurse or doctor.          STOP taking these medications    multivitamin-iron-minerals-folic acid chewable tablet Stopped by: Reather Littler, MD   Evaristo Bury FlexTouch 100 UNIT/ML FlexTouch Pen Generic drug: insulin degludec Stopped by: Reather Littler, MD   Trulicity 4.5 MG/0.5ML Sopn Generic drug: Dulaglutide Stopped by: Reather Littler, MD       TAKE these medications    amLODipine 10 MG tablet Commonly known as: NORVASC Take 10 mg by mouth daily.   aspirin EC 81 MG tablet Take 81 mg by mouth daily.   atorvastatin 80 MG tablet Commonly known as: LIPITOR Take 80 mg by mouth daily.   B-12 PO Take by mouth daily at 6 (six) AM.   Basaglar KwikPen 100 UNIT/ML INJECT 24 UNITS INTO THE SKIN ONCE DAILY   Farxiga 10 MG Tabs tablet Generic drug: dapagliflozin propanediol take 1 tablet by mouth once a day before breakfast   Insulin Pen Needle 31G X 5 MM Misc To Inject insulin 2 times daily   metFORMIN 500 MG 24 hr tablet Commonly known as: GLUCOPHAGE-XR Take 4 tablets (2,000 mg total) by mouth daily with supper. What changed:  when to take this additional instructions   metoprolol succinate 100 MG 24 hr tablet Commonly known as: TOPROL-XL Take 100 mg by mouth daily.   nystatin cream Commonly known as: MYCOSTATIN Apply 1 application topically 2 (two) times daily.   OneTouch Delica Plus Lancet33G Misc Use to check blood sugar 2 times a day   OneTouch Verio Flex System w/Device Kit Use to check blood sugar 2 times a day   OneTouch Verio test strip Generic drug: glucose blood USE TO CHECK BLOOD SUGAR TWO TIMES A DAY AS INSTRUCTED What changed: Another medication with the same name was removed. Continue taking this medication,  and follow the directions you see here. Changed by: Reather Littler, MD   telmisartan-hydrochlorothiazide 80-25 MG tablet Commonly known as: MICARDIS HCT Take 1 tablet by mouth daily.        Allergies:  Allergies  Allergen Reactions   Ramipril Cough    Past Medical History:  Diagnosis Date   Diabetes mellitus    Hyperlipidemia    Hypertension  Past Surgical History:  Procedure Laterality Date   LAPAROSCOPIC GASTRIC BANDING  01/29/2007    No family history on file.  Social History:  reports that he has never smoked. He has never used smokeless tobacco. He reports current alcohol use of about 5.0 standard drinks of alcohol per week. He reports that he does not use drugs.   Review of Systems   Lipid history:  LDL is variable but usually below 100 He has been continued on Lipitor 80 mg from PCP   . Lab Results  Component Value Date   CHOL 153 11/08/2021   CHOL 135 09/12/2019   CHOL 164 07/10/2019   Lab Results  Component Value Date   HDL 57.60 11/08/2021   HDL 48.40 09/12/2019   HDL 37.80 (L) 07/10/2019   Lab Results  Component Value Date   LDLCALC 66 11/08/2021   LDLCALC 70 09/12/2019   LDLCALC 63 10/16/2018   Lab Results  Component Value Date   TRIG 146.0 11/08/2021   TRIG 82.0 09/12/2019   TRIG (H) 07/10/2019    405.0 Triglyceride is over 400; calculations on Lipids are invalid.   Lab Results  Component Value Date   CHOLHDL 3 11/08/2021   CHOLHDL 3 09/12/2019   CHOLHDL 4 07/10/2019   Lab Results  Component Value Date   LDLDIRECT 99.0 04/29/2021   LDLDIRECT 78.0 07/10/2019   LDLDIRECT 71.0 06/20/2016            Hypertension: This is followed by PCP  Is on Micardis 80/25 HCT and amlodipine 10 mg No complaints of lightheadedness  BP Readings from Last 3 Encounters:  06/26/22 118/72  03/20/22 108/68  11/10/21 (!) 138/90    Most recent eye exam was in 01/2021  Most recent foot exam: 1/21  He has sleep apnea and using the  CPAP  LABS:  Lab on 06/22/2022  Component Date Value Ref Range Status   Microalb, Ur 06/22/2022 <0.7  0.0 - 1.9 mg/dL Final   Creatinine,U 16/10/9602 171.8  mg/dL Final   Microalb Creat Ratio 06/22/2022 0.4  0.0 - 30.0 mg/g Final   Sodium 06/22/2022 142  135 - 145 mEq/L Final   Potassium 06/22/2022 4.3  3.5 - 5.1 mEq/L Final   Chloride 06/22/2022 100  96 - 112 mEq/L Final   CO2 06/22/2022 34 (H)  19 - 32 mEq/L Final   Glucose, Bld 06/22/2022 140 (H)  70 - 99 mg/dL Final   BUN 54/09/8117 11  6 - 23 mg/dL Final   Creatinine, Ser 06/22/2022 1.03  0.40 - 1.50 mg/dL Final   GFR 14/78/2956 81.16  >60.00 mL/min Final   Calculated using the CKD-EPI Creatinine Equation (2021)   Calcium 06/22/2022 9.6  8.4 - 10.5 mg/dL Final   Hgb O1H MFr Bld 06/22/2022 8.7 (H)  4.6 - 6.5 % Final   Glycemic Control Guidelines for People with Diabetes:Non Diabetic:  <6%Goal of Therapy: <7%Additional Action Suggested:  >8%      Physical Examination:  BP 118/72   Pulse 79   Ht 6' 3.25" (1.911 m)   Wt 276 lb (125.2 kg)   SpO2 96%   BMI 34.27 kg/m      ASSESSMENT: 7/23  Diabetes type 2, uncontrolled, with BMI 34  See history of present illness for detailed discussion of current diabetes management, blood sugar patterns and problems identified  His A1c is 8.7  A1c is continuing to be higher and lowest in the last year 8.1 only  Current regimen is basal  insulin, Marcelline Deist and was on Trulicity 4.5 mg  He has tried to do better with his diet and has not gained weight However had limitations with his walking and going to the gym lately Again has not had any Trulicity for the last 6 weeks   Hypertension: Blood pressure is well-controlled  Microalbumin normal  PLAN:    Prescription sent for [facility], will need to try and get approval for this with the PA since he has not had any good control with Trulicity Continue 24 units of Basaglar Resume exercise when able to Cut back on regular soft  drinks In the meantime check blood sugars more often after meals and bring monitor for download    Patient Instructions  Check blood sugars on waking up 2-3 days a week  Also check blood sugars about 2 hours after meals and do this after different meals by rotation  Recommended blood sugar levels on waking up are 90-130 and about 2 hours after meal is 130-160  Please bring your blood sugar monitor to each visit, thank you    Reather Littler 06/26/2022, 1:42 PM   Note: This office note was prepared with Dragon voice recognition system technology. Any transcriptional errors that result from this process are unintentional.

## 2022-06-26 NOTE — Patient Instructions (Addendum)
Check blood sugars on waking up 2-3 days a week  Also check blood sugars about 2 hours after meals and do this after different meals by rotation  Recommended blood sugar levels on waking up are 90-130 and about 2 hours after meal is 130-160  Please bring your blood sugar monitor to each visit, thank you  Call for Eye exam

## 2022-06-27 ENCOUNTER — Telehealth: Payer: Self-pay

## 2022-06-27 NOTE — Telephone Encounter (Signed)
Patient Advocate Encounter   Received notification from pt msgs that prior authorization is required for mounjaro  Submitted: 06/27/22 Key [id number]

## 2022-06-29 ENCOUNTER — Other Ambulatory Visit (HOSPITAL_COMMUNITY): Payer: Self-pay

## 2022-06-29 NOTE — Telephone Encounter (Signed)
Pharmacy Patient Advocate Encounter  Received notification from  [employer]  that Prior Authorization for Greggory Keen has been APPROVED from 06/27/22 to 06/28/23.Marland Kitchen  PA #/Case ID/Reference #: Case ID: [id number]  Bydureon, Byetta and Trulicity preferred.  Per test claim, copay is 351.94 with [employer]oucher paying $150. Price may be different at other pharmacies.

## 2022-06-30 NOTE — Telephone Encounter (Signed)
Patient aware.

## 2022-07-10 ENCOUNTER — Other Ambulatory Visit: Payer: Self-pay | Admitting: Endocrinology

## 2022-07-10 DIAGNOSIS — E1165 Type 2 diabetes mellitus with hyperglycemia: Secondary | ICD-10-CM

## 2022-07-18 ENCOUNTER — Other Ambulatory Visit (HOSPITAL_COMMUNITY): Payer: Self-pay

## 2022-07-25 ENCOUNTER — Telehealth: Payer: Self-pay | Admitting: Podiatry

## 2022-07-25 NOTE — Telephone Encounter (Signed)
Pt calling for status of orthotics.  I did apologize and explained we are short staffed in the office but we are working on calling all pts that have orthotics in the office to get them scheduled as soon as possible.

## 2022-08-02 ENCOUNTER — Ambulatory Visit: Payer: Commercial Managed Care - HMO | Admitting: Podiatrist

## 2022-08-02 DIAGNOSIS — M2142 Flat foot [pes planus] (acquired), left foot: Secondary | ICD-10-CM

## 2022-08-02 DIAGNOSIS — M2141 Flat foot [pes planus] (acquired), right foot: Secondary | ICD-10-CM

## 2022-08-02 NOTE — Progress Notes (Signed)
Patient presents today to pick up orthotics. They are noted to contour the arch and foot nicely and fit well in the shoes.  Break in period recommended and instructions for wear given.    

## 2022-08-17 ENCOUNTER — Other Ambulatory Visit (INDEPENDENT_AMBULATORY_CARE_PROVIDER_SITE_OTHER): Payer: Commercial Managed Care - HMO

## 2022-08-17 DIAGNOSIS — E1165 Type 2 diabetes mellitus with hyperglycemia: Secondary | ICD-10-CM | POA: Diagnosis not present

## 2022-08-17 DIAGNOSIS — Z794 Long term (current) use of insulin: Secondary | ICD-10-CM

## 2022-08-17 LAB — GLUCOSE, RANDOM: Glucose, Bld: 157 mg/dL — ABNORMAL HIGH (ref 70–99)

## 2022-08-23 ENCOUNTER — Encounter: Payer: Self-pay | Admitting: Endocrinology

## 2022-08-23 ENCOUNTER — Ambulatory Visit: Payer: Commercial Managed Care - HMO | Admitting: Endocrinology

## 2022-08-23 VITALS — BP 128/74 | HR 81 | Ht 75.25 in | Wt 280.4 lb

## 2022-08-23 DIAGNOSIS — E1165 Type 2 diabetes mellitus with hyperglycemia: Secondary | ICD-10-CM

## 2022-08-23 DIAGNOSIS — Z794 Long term (current) use of insulin: Secondary | ICD-10-CM | POA: Diagnosis not present

## 2022-08-23 NOTE — Patient Instructions (Signed)
No change on diabetes medicine.

## 2022-08-23 NOTE — Progress Notes (Signed)
Outpatient Endocrinology Note Gregory Annaelle Kasel, MD  08/23/22  Patient Name: Gregory Brady   DOB : 08-May-1965 MRN # [id number]                                                    REASON OF VISIT: Follow up for type 2 diabetes mellitus  PCP: Blair Heys, MD  HISTORY OF PRESENT ILLNESS:   Gregory Brady is a 57 y.o. old male with past medical history listed below, is here for follow up of type 2 diabetes mellitus.   Pertinent Diabetes History: Diagnosed with type 2 diabetes mellitus around 1997.  He was initially treated with metformin and glipizide in the beginning.  Patient was treated with Januvia which was changed to Onglyza in 2017.  He mostly has hemoglobin A1c in the range of 8%.  He was later treated with insulin therapy.  He fructosamine was also monitored in the past which was corresponding with A1c.  Chronic Diabetes Complications : Retinopathy: no. Last ophthalmology exam was done on annually, reportedly. Nephropathy: no, on imlesartan Peripheral neuropathy: no Coronary artery disease: no Stroke: no  Relevant comorbidities and cardiovascular risk factors: Obesity: yes Body mass index is 34.82 kg/m.  Status post gastric band in 2009. Hypertension: Yes Hyperlipidemia.  Yes, on statin  Current / Home Diabetic regimen includes: Basaglar 24 units daily. Trulicity 4.5 mg weekly. Farxiga 10 mg daily. Metformin extended release 2000 mg daily.  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 Raw data and trends analyzed.   -  He has been testing his blood glucoses 0-2 times times daily.  2-3 times a week. -  Average glucose for the last 14 days is 132 mg/dl.. -  Trends noted: He has been checking in the morning fasting almost all blood sugar are acceptable, some of the blood sugar numbers  129, 146, 150, 114, 135, 120.  Hypoglycemia: Patient has no hypoglycemic episodes. Patient has hypoglycemia awareness.  Factors modifying glucose control: 1.  Diabetic diet assessment: eating better.   2.  Staying active or exercising: excerice for last 2weeks.   3.  Medication compliance: compliant most of the time.  Other: -He has obstructive sleep apnea on CPAP.  Interval history 08/23/22 Hemoglobin was 8.7% in June.  Fructosamine recently on August 1 was 331 which is corresponding with about 8% of hemoglobin A1c.  He was off of Trulicity for several weeks due to backorder, has been back on it for last 1 month.  Tolerating well.  He started exercising for about 2 week.  Greggory Keen was prescribed in June however not covered.  Greggory Keen was prescribed in June however was not, and restarted on Trulicity.  REVIEW OF SYSTEMS As per history of present illness.   PAST MEDICAL HISTORY: Past Medical History:  Diagnosis Date   Diabetes mellitus    Hyperlipidemia    Hypertension     PAST SURGICAL HISTORY: Past Surgical History:  Procedure Laterality Date   LAPAROSCOPIC GASTRIC BANDING  01/29/2007    ALLERGIES: Allergies  Allergen Reactions   Ramipril Cough    FAMILY HISTORY:  History reviewed. No pertinent family history.  SOCIAL HISTORY: Social History  Socioeconomic History   Marital status: Single    Spouse name: Not on file   Number of children: Not on file   Years of education: Not on file   Highest education level: Not on file  Occupational History   Not on file  Tobacco Use   Smoking status: Never   Smokeless tobacco: Never  Substance and Sexual Activity   Alcohol use: Yes    Alcohol/week: 5.0 standard drinks of alcohol    Types: 5 Shots of liquor per week   Drug use: No   Sexual activity: Not on file  Other Topics Concern   Not on file  Social History Narrative   Not on file   Social Determinants of Health   Financial Resource Strain: Not on file   Food Insecurity: Not on file  Transportation Needs: Not on file  Physical Activity: Not on file  Stress: Not on file  Social Connections: Not on file    MEDICATIONS:  Current Outpatient Medications  Medication Sig Dispense Refill   amLODipine (NORVASC) 10 MG tablet Take 10 mg by mouth daily.     aspirin EC 81 MG tablet Take 81 mg by mouth daily.     atorvastatin (LIPITOR) 80 MG tablet Take 80 mg by mouth daily.   1   Blood Glucose Monitoring Suppl (ONETOUCH VERIO FLEX SYSTEM) w/Device KIT Use to check blood sugar 2 times a day 1 kit 0   Cyanocobalamin (B-12 PO) Take by mouth daily at 6 (six) AM.     FARXIGA 10 MG TABS tablet take 1 tablet by mouth once a day before breakfast 90 tablet 0   glucose blood (ONETOUCH VERIO) test strip USE TO CHECK BLOOD SUGAR TWO TIMES A DAY AS INSTRUCTED 100 strip 0   Insulin Glargine (BASAGLAR KWIKPEN) 100 UNIT/ML INJECT 24 UNITS INTO THE SKIN ONCE DAILY 15 mL 2   Insulin Pen Needle 31G X 5 MM MISC To Inject insulin 2 times daily 100 each 1   Lancets (ONETOUCH DELICA PLUS LANCET33G) MISC Use to check blood sugar 2 times a day 100 each 3   metFORMIN (GLUCOPHAGE-XR) 500 MG 24 hr tablet Take 4 tablets (2,000 mg total) by mouth daily with supper. (Patient taking differently: Take 2,000 mg by mouth daily with breakfast. Takes in the morning) 120 tablet 3   metoprolol succinate (TOPROL-XL) 100 MG 24 hr tablet Take 100 mg by mouth daily.     nystatin cream (MYCOSTATIN) Apply 1 application topically 2 (two) times daily.     telmisartan-hydrochlorothiazide (MICARDIS HCT) 80-25 MG tablet Take 1 tablet by mouth daily.     TRULICITY 4.5 MG/0.5ML SOPN Inject into the skin.     No current facility-administered medications for this visit.    PHYSICAL EXAM: Vitals:   08/23/22 1312  BP: 128/74  Pulse: 81  SpO2: 96%  Weight: 280 lb 6.4 oz (127.2 kg)  Height: 6' 3.25" (1.911 m)   Body mass index is 34.82 kg/m.  Wt Readings from Last 3 Encounters:  08/23/22 280 lb  6.4 oz (127.2 kg)  06/26/22 276 lb (125.2 kg)  03/20/22 278 lb 12.8 oz (126.5 kg)    General: Well developed, well nourished male in no apparent distress.  HEENT: AT/[place], no external lesions.  Eyes: Conjunctiva clear and no icterus. Neck: Neck supple  Lungs: Respirations not labored Neurologic: Alert, oriented, normal speech Extremities / Skin: Dry. No sores or rashes noted. No acanthosis nigricans Psychiatric: Does not appear depressed or anxious  Diabetic  Foot Exam - Simple   No data filed    LABS Reviewed Lab Results  Component Value Date   HGBA1C 8.7 (H) 06/22/2022   HGBA1C 8.8 (H) 03/14/2022   HGBA1C 8.4 (H) 11/08/2021   Lab Results  Component Value Date   FRUCTOSAMINE 331 (H) 08/17/2022   FRUCTOSAMINE 303 (H) 06/29/2020   FRUCTOSAMINE 281 09/12/2019   Lab Results  Component Value Date   CHOL 153 11/08/2021   HDL 57.60 11/08/2021   LDLCALC 66 11/08/2021   LDLDIRECT 99.0 04/29/2021   TRIG 146.0 11/08/2021   CHOLHDL 3 11/08/2021   Lab Results  Component Value Date   MICRALBCREAT 0.4 06/22/2022   MICRALBCREAT 0.6 08/01/2021   Lab Results  Component Value Date   CREATININE 1.03 06/22/2022   Lab Results  Component Value Date   GFR 81.16 06/22/2022    ASSESSMENT / PLAN  1. Uncontrolled type 2 diabetes mellitus with hyperglycemia, with long-term current use of insulin (HCC)     Diabetes Mellitus type 2 - Diabetic status / severity: Uncontrolled  Lab Results  Component Value Date   HGBA1C 8.7 (H) 06/22/2022    - Hemoglobin A1c goal : <7%  Patient has uncontrolled type 2 diabetes mellitus mostly hemoglobin A1c in the range of 8%.  Correspondingly high fructosamine.  He was off of Trulicity for few months and restarted of 1 month ago.   - Medications: No changes today. Expect with restarting trulicity and life style modification.   I) continue Basaglar 25 units daily. II) continue Trulicity 4.5 mg weekly. III) continue Farxiga 10 mg daily. IV)  continue metformin ER 2000 mg daily.   - Home glucose testing: at least two times a day fasting and at bedtime and occasionally 2 hours after meals in the afternoon.  - Discussed/ Gave Hypoglycemia treatment plan.  # Consult : not required at this time.   # Annual urine for microalbuminuria/ creatinine ratio, no microalbuminuria currently, continue ACE/ARB on telmisartan Last  Lab Results  Component Value Date   MICRALBCREAT 0.4 06/22/2022    # Foot check nightly.  # Annual dilated diabetic eye exams.   - Diet: Make healthy diabetic food choices - Life style / activity / exercise: discussed, started exercise.   2. Blood pressure  -  BP Readings from Last 1 Encounters:  08/23/22 128/74    - Control is in target.  - No change in current plans.  3. Lipid status / Hyperlipidemia - Last  Lab Results  Component Value Date   LDLCALC 66 11/08/2021   - Continue atorvastatin 80mg  daily.    Diagnoses and all orders for this visit:  Uncontrolled type 2 diabetes mellitus with hyperglycemia, with long-term current use of insulin (HCC)   DISPOSITION Follow up in clinic in 3 months suggested.   All questions answered and patient verbalized understanding of the plan.  Gregory Zriyah Kopplin, MD [facility] Endocrinology [facility] [address] [place], [address] [place], [place] [address] Phone # [phone]  At least part of this note was generated using voice recognition software. Inadvertent word errors may have occurred, which were not recognized during the proofreading process.

## 2022-09-15 ENCOUNTER — Other Ambulatory Visit: Payer: Self-pay | Admitting: Endocrinology

## 2022-11-13 ENCOUNTER — Other Ambulatory Visit: Payer: Self-pay

## 2022-11-13 DIAGNOSIS — E1165 Type 2 diabetes mellitus with hyperglycemia: Secondary | ICD-10-CM

## 2022-11-13 MED ORDER — DAPAGLIFLOZIN PROPANEDIOL 10 MG PO TABS: 10.0000 mg | ORAL_TABLET | Freq: Every day | ORAL | 0 refills | Status: DC

## 2022-11-17 ENCOUNTER — Other Ambulatory Visit: Payer: Self-pay

## 2022-11-17 DIAGNOSIS — E1165 Type 2 diabetes mellitus with hyperglycemia: Secondary | ICD-10-CM

## 2022-11-17 MED ORDER — DAPAGLIFLOZIN PROPANEDIOL 10 MG PO TABS: 10.0000 mg | ORAL_TABLET | Freq: Every day | ORAL | 0 refills | Status: DC

## 2022-11-28 ENCOUNTER — Encounter: Payer: Self-pay | Admitting: Endocrinology

## 2022-11-28 ENCOUNTER — Ambulatory Visit: Payer: Managed Care, Other (non HMO) | Admitting: Endocrinology

## 2022-11-28 VITALS — BP 124/70 | HR 67 | Resp 20 | Ht 75.25 in | Wt 279.2 lb

## 2022-11-28 DIAGNOSIS — Z794 Long term (current) use of insulin: Secondary | ICD-10-CM

## 2022-11-28 DIAGNOSIS — E1165 Type 2 diabetes mellitus with hyperglycemia: Secondary | ICD-10-CM | POA: Diagnosis not present

## 2022-11-28 LAB — POCT GLYCOSYLATED HEMOGLOBIN (HGB A1C): Hemoglobin A1C: 8.7 % — AB (ref 4.0–5.6)

## 2022-11-28 MED ORDER — BASAGLAR KWIKPEN 100 UNIT/ML ~~LOC~~ SOPN: PEN_INJECTOR | SUBCUTANEOUS | 2 refills | Status: DC

## 2022-11-28 NOTE — Patient Instructions (Signed)
I) Increase Basaglar 28 units daily. II) continue Trulicity 4.5 mg weekly. III) continue Farxiga 10 mg daily. IV) continue metformin ER 2000 mg daily.

## 2022-11-28 NOTE — Progress Notes (Signed)
Outpatient Endocrinology Note Gregory Ermal Haberer, MD  11/28/22  Patient Name: Gregory Brady   DOB : Sep 14, 1965 MRN # [id number]                                                    REASON OF VISIT: Follow up for type 2 diabetes mellitus  PCP: Gregory Heys, MD (Inactive)  HISTORY OF PRESENT ILLNESS:   Gregory Brady is a 57 y.o. old male with past medical history listed below, is here for follow up of type 2 diabetes mellitus.   Pertinent Diabetes History: Diagnosed with type 2 diabetes mellitus around 1997.  He was initially treated with metformin and glipizide in the beginning.  Patient was treated with Januvia which was changed to Onglyza in 2017.  He mostly has hemoglobin A1c in the range of 8%.  He was later treated with insulin therapy.  He fructosamine was also monitored in the past which was corresponding with A1c.  Chronic Diabetes Complications : Retinopathy: no. Last ophthalmology exam was done on Due, reportedly. Nephropathy: no, on imlesartan Peripheral neuropathy: no Coronary artery disease: no Stroke: no  Relevant comorbidities and cardiovascular risk factors: Obesity: yes Body mass index is 34.67 kg/m.  Status post gastric band in 2009. Hypertension: Yes Hyperlipidemia.  Yes, on statin  Current / Home Diabetic regimen includes: Basaglar 24 units daily. Trulicity 4.5 mg weekly. Farxiga 10 mg daily. Metformin extended release 2000 mg daily.  Prior diabetic medications: Glipizide, Januvia, Onglyza Periodic balanitis from Gregory Brady was tried in the past however was not cost effective.  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 He has One Touch Verio flex, glucometer download reviewed from October 29 to October 12.  He has not been checking much he has been taking 0.3 times a day in average.  Highest blood sugar 179, lowest blood sugar 138.  He had checked twice in last 2 weeks.  No hypoglycemia.  Hypoglycemia: Patient  has no hypoglycemic episodes. Patient has hypoglycemia awareness.  Factors modifying glucose control: 1.  Diabetic diet assessment: eating better.   2.  Staying active or exercising: Busy at work..   3.  Medication compliance: compliant most of the time.  Other: -He has obstructive sleep apnea on CPAP.  Interval history 11/28/22 Hemoglobin A1c 8.7% today.  He has not been checking much blood sugar.  He reports compliance with diabetes regimen as listed above.  He is still trying to find ophthalmology for his diabetic eye exam.  He still drinks soft drinks has not been exercising much.  He reports he was off of Trulicity for few weeks and lately has been taking regularly.  No other complaints today.   REVIEW OF SYSTEMS As per history of present illness.   PAST MEDICAL HISTORY: Past Medical History:  Diagnosis Date   Diabetes mellitus    Hyperlipidemia    Hypertension     PAST SURGICAL HISTORY: Past Surgical History:  Procedure Laterality Date   LAPAROSCOPIC GASTRIC BANDING  01/29/2007    ALLERGIES: Allergies  Allergen Reactions   Ramipril Cough    FAMILY HISTORY:  History reviewed. No pertinent family history.  SOCIAL HISTORY: Social History   Socioeconomic History   Marital status: Single    Spouse name: Not on file   Number of  children: Not on file   Years of education: Not on file   Highest education level: Not on file  Occupational History   Not on file  Tobacco Use   Smoking status: Never   Smokeless tobacco: Never  Substance and Sexual Activity   Alcohol use: Yes    Alcohol/week: 5.0 standard drinks of alcohol    Types: 5 Shots of liquor per week   Drug use: No   Sexual activity: Not on file  Other Topics Concern   Not on file  Social History Narrative   Not on file   Social Determinants of Health   Financial Resource Strain: Not on file  Food Insecurity: Not on file  Transportation Needs: Not on file  Physical Activity: Not on file   Stress: Not on file  Social Connections: Not on file    MEDICATIONS:  Current Outpatient Medications  Medication Sig Dispense Refill   amLODipine (NORVASC) 10 MG tablet Take 10 mg by mouth daily.     aspirin EC 81 MG tablet Take 81 mg by mouth daily.     atorvastatin (LIPITOR) 80 MG tablet Take 80 mg by mouth daily.   1   Blood Glucose Monitoring Suppl (ONETOUCH VERIO FLEX SYSTEM) w/Device KIT Use to check blood sugar 2 times a day 1 kit 0   Cyanocobalamin (B-12 PO) Take by mouth daily at 6 (six) AM.     dapagliflozin propanediol (FARXIGA) 10 MG TABS tablet Take 1 tablet (10 mg total) by mouth daily. 90 tablet 0   Dulaglutide (TRULICITY) 4.5 MG/0.5ML SOPN INJECT 0.5ML(4.5MG ) INTO THE SKIN ONCE WEEKLY 2 mL 3   glucose blood (ONETOUCH VERIO) test strip USE TO CHECK BLOOD SUGAR TWO TIMES A DAY AS INSTRUCTED 100 strip 0   Insulin Pen Needle 31G X 5 MM MISC To Inject insulin 2 times daily 100 each 1   Lancets (ONETOUCH DELICA PLUS LANCET33G) MISC Use to check blood sugar 2 times a day 100 each 3   metFORMIN (GLUCOPHAGE-XR) 500 MG 24 hr tablet Take 4 tablets (2,000 mg total) by mouth daily with supper. (Patient taking differently: Take 2,000 mg by mouth daily with breakfast. Takes in the morning) 120 tablet 3   metoprolol succinate (TOPROL-XL) 100 MG 24 hr tablet Take 100 mg by mouth daily.     nystatin cream (MYCOSTATIN) Apply 1 application topically 2 (two) times daily.     telmisartan-hydrochlorothiazide (MICARDIS HCT) 80-25 MG tablet Take 1 tablet by mouth daily.     Insulin Glargine (BASAGLAR KWIKPEN) 100 UNIT/ML inject 28 units into the skin once daily.INJECT 28 UNITS INTO THE SKIN ONCE DAILY 15 mL 2   No current facility-administered medications for this visit.    PHYSICAL EXAM: Vitals:   11/28/22 1312  BP: 124/70  Pulse: 67  Resp: 20  SpO2: 97%  Weight: 279 lb 3.2 oz (126.6 kg)  Height: 6' 3.25" (1.911 m)    Body mass index is 34.67 kg/m.  Wt Readings from Last 3 Encounters:   11/28/22 279 lb 3.2 oz (126.6 kg)  08/23/22 280 lb 6.4 oz (127.2 kg)  06/26/22 276 lb (125.2 kg)    General: Well developed, well nourished male in no apparent distress.  HEENT: AT/[place], no external lesions.  Eyes: Conjunctiva clear and no icterus. Neck: Neck supple  Lungs: Respirations not labored Neurologic: Alert, oriented, normal speech Extremities / Skin: Dry. No sores or rashes noted.  Psychiatric: Does not appear depressed or anxious  Diabetic Foot Exam - Simple  No data filed    LABS Reviewed Lab Results  Component Value Date   HGBA1C 8.7 (A) 11/28/2022   HGBA1C 8.7 (H) 06/22/2022   HGBA1C 8.8 (H) 03/14/2022   Lab Results  Component Value Date   FRUCTOSAMINE 331 (H) 08/17/2022   FRUCTOSAMINE 303 (H) 06/29/2020   FRUCTOSAMINE 281 09/12/2019   Lab Results  Component Value Date   CHOL 153 11/08/2021   HDL 57.60 11/08/2021   LDLCALC 66 11/08/2021   LDLDIRECT 99.0 04/29/2021   TRIG 146.0 11/08/2021   CHOLHDL 3 11/08/2021   Lab Results  Component Value Date   MICRALBCREAT 0.4 06/22/2022   MICRALBCREAT 0.6 08/01/2021   Lab Results  Component Value Date   CREATININE 1.03 06/22/2022   Lab Results  Component Value Date   GFR 81.16 06/22/2022    ASSESSMENT / PLAN  1. Uncontrolled type 2 diabetes mellitus with hyperglycemia, with long-term current use of insulin (HCC)      Diabetes Mellitus type 2 - Diabetic status / severity: Uncontrolled  Lab Results  Component Value Date   HGBA1C 8.7 (A) 11/28/2022    - Hemoglobin A1c goal : <7%  Patient has uncontrolled type 2 diabetes mellitus mostly hemoglobin A1c in the range of 8%.  Correspondingly high fructosamine.  She has about restarting exercise.  Discussed about stopping soft drinks and decreasing carbohydrate portion in the meals.  - Medications: See below.  I) increase Basaglar 24 units to 28 units daily. II) continue Trulicity 4.5 mg weekly. III) continue Farxiga 10 mg daily. IV) continue  metformin ER 2000 mg daily.   - Home glucose testing: at least two times a day fasting and at bedtime and occasionally 2 hours after meals in the afternoon.  - Discussed/ Gave Hypoglycemia treatment plan.  # Consult : not required at this time.   # Annual urine for microalbuminuria/ creatinine ratio, no microalbuminuria currently, continue ACE/ARB on telmisartan Last  Lab Results  Component Value Date   MICRALBCREAT 0.4 06/22/2022    # Foot check nightly.  # Annual dilated diabetic eye exams.  Advised to make diabetic eye exam follow-up.  - Diet: Make healthy diabetic food choices - Life style / activity / exercise: discussed, started exercise.   2. Blood pressure  -  BP Readings from Last 1 Encounters:  11/28/22 124/70    - Control is in target.  - No change in current plans.  3. Lipid status / Hyperlipidemia - Last  Lab Results  Component Value Date   LDLCALC 66 11/08/2021   - Continue atorvastatin 80mg  daily.   -Advised to have lipid level checked today, patient wants to wait for next visit.  Diagnoses and all orders for this visit:  Uncontrolled type 2 diabetes mellitus with hyperglycemia, with long-term current use of insulin (HCC) -     POCT glycosylated hemoglobin (Hb A1C) -     Insulin Glargine (BASAGLAR KWIKPEN) 100 UNIT/ML; inject 28 units into the skin once daily.INJECT 28 UNITS INTO THE SKIN ONCE DAILY    DISPOSITION Follow up in clinic in 3 months suggested.   All questions answered and patient verbalized understanding of the plan.  Gregory Laprecious Austill, MD [facility] Endocrinology [facility] [address] [place], [address] [place], [place] [address] Phone # [phone]  At least part of this note was generated using voice recognition software. Inadvertent word errors may have occurred, which were not recognized during the proofreading process.

## 2023-02-12 ENCOUNTER — Other Ambulatory Visit: Payer: Self-pay | Admitting: Endocrinology

## 2023-02-28 ENCOUNTER — Encounter: Payer: Self-pay | Admitting: Endocrinology

## 2023-02-28 ENCOUNTER — Ambulatory Visit: Payer: 59 | Admitting: Endocrinology

## 2023-02-28 VITALS — BP 128/60 | HR 97 | Resp 20 | Ht 75.25 in | Wt 282.2 lb

## 2023-02-28 DIAGNOSIS — Z794 Long term (current) use of insulin: Secondary | ICD-10-CM

## 2023-02-28 DIAGNOSIS — E1165 Type 2 diabetes mellitus with hyperglycemia: Secondary | ICD-10-CM | POA: Diagnosis not present

## 2023-02-28 LAB — POCT GLYCOSYLATED HEMOGLOBIN (HGB A1C): Hemoglobin A1C: 9.3 % — AB (ref 4.0–5.6)

## 2023-02-28 MED ORDER — TIRZEPATIDE 10 MG/0.5ML ~~LOC~~ SOAJ: 10.0000 mg | SUBCUTANEOUS | 4 refills | Status: DC

## 2023-02-28 NOTE — Patient Instructions (Signed)
I) Basaglar 28 units daily. II) Stop Trulicity 4.5 mg weekly. III) continue Farxiga 10 mg daily. IV) continue metformin ER 2000 mg daily.  V) Mounjaro 10 mg weekly.

## 2023-02-28 NOTE — Progress Notes (Signed)
Outpatient Endocrinology Note [name], MD  02/28/23  Patient Name: Gregory Brady   DOB : Dec 16, 1965 MRN # [id number]                                                    REASON OF VISIT: Follow up for type 2 diabetes mellitus  PCP: Debroah Loop, DO  HISTORY OF PRESENT ILLNESS:   Gregory Brady is a 58 y.o. old male with past medical history listed below, is here for follow up of type 2 diabetes mellitus.   Pertinent Diabetes History: Diagnosed with type 2 diabetes mellitus around 1997.  He was initially treated with metformin and glipizide in the beginning.  Patient was treated with Januvia which was changed to Onglyza in 2017.  He mostly has hemoglobin A1c in the range of 8%.  He was later treated with insulin therapy.  He fructosamine was also monitored in the past which was corresponding with A1c.  Chronic Diabetes Complications : Retinopathy: no. Last ophthalmology exam was done on Due, reportedly. Nephropathy: no, on imlesartan Peripheral neuropathy: no Coronary artery disease: no Stroke: no  Relevant comorbidities and cardiovascular risk factors: Obesity: yes Body mass index is 35.04 kg/m.  Status post gastric band in 2009. Hypertension: Yes Hyperlipidemia.  Yes, on statin  Current / Home Diabetic regimen includes: Basaglar 28 units daily. Trulicity 4.5 mg weekly.  Has not been taking for a month. Farxiga 10 mg daily. Metformin extended release 2000 mg daily.  Prior diabetic medications: Glipizide, Januvia, Onglyza Periodic balanitis from Dewayne Shorter was tried in the past however was not cost effective.  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 He has One Touch Verio flex, glucometer download reviewed from January 29 to February 28, 2023, average blood sugar 145, lowest 111, highest 219.  Morning fasting blood sugar 109, 133, 155, 125.  In the afternoon 115, 219, 111.  Most acceptable blood  sugar.  Hypoglycemia: Patient has no hypoglycemic episodes. Patient has hypoglycemia awareness.  Factors modifying glucose control: 1.  Diabetic diet assessment: eating better.   2.  Staying active or exercising: Busy at work..   3.  Medication compliance: compliant most of the time.  Other: -He has obstructive sleep apnea on CPAP.  Interval history  Hemoglobin A1c 9.3% today, worsening.  Patient reports he has not been able to get Trulicity, not able to refill for about a month.  Reports compliance with other diabetic medication as noted above.  No complaints of numbness and tingling of the feet.  He reports complaints of occasional dizziness and frequent urination and going to have visit with primary care provider tomorrow.  No other complaints today.   REVIEW OF SYSTEMS As per history of present illness.   PAST MEDICAL HISTORY: Past Medical History:  Diagnosis Date   Diabetes mellitus    Hyperlipidemia    Hypertension     PAST SURGICAL HISTORY: Past Surgical History:  Procedure Laterality Date   LAPAROSCOPIC GASTRIC BANDING  01/29/2007    ALLERGIES: Allergies  Allergen Reactions   Ramipril Cough    FAMILY HISTORY:  History reviewed. No pertinent family history.  SOCIAL HISTORY: Social History   Socioeconomic History   Marital status: Single    Spouse name: Not on file   Number of children:  Not on file   Years of education: Not on file   Highest education level: Not on file  Occupational History   Not on file  Tobacco Use   Smoking status: Never   Smokeless tobacco: Never  Substance and Sexual Activity   Alcohol use: Yes    Alcohol/week: 5.0 standard drinks of alcohol    Types: 5 Shots of liquor per week   Drug use: No   Sexual activity: Not on file  Other Topics Concern   Not on file  Social History Narrative   Not on file   Social Drivers of Health   Financial Resource Strain: Not on file  Food Insecurity: Not on file  Transportation Needs:  Not on file  Physical Activity: Not on file  Stress: Not on file  Social Connections: Not on file    MEDICATIONS:  Current Outpatient Medications  Medication Sig Dispense Refill   tirzepatide (MOUNJARO) 10 MG/0.5ML Pen Inject 10 mg into the skin once a week. 6 mL 4   amLODipine (NORVASC) 10 MG tablet Take 10 mg by mouth daily.     aspirin EC 81 MG tablet Take 81 mg by mouth daily.     atorvastatin (LIPITOR) 80 MG tablet Take 80 mg by mouth daily.   1   Blood Glucose Monitoring Suppl (ONETOUCH VERIO FLEX SYSTEM) w/Device KIT Use to check blood sugar 2 times a day 1 kit 0   Cyanocobalamin (B-12 PO) Take by mouth daily at 6 (six) AM.     dapagliflozin propanediol (FARXIGA) 10 MG TABS tablet Take 1 tablet (10 mg total) by mouth daily. 90 tablet 0   Dulaglutide (TRULICITY) 4.5 MG/0.5ML SOAJ INJECT 0.5ML(4.5MG ) INTO THE SKIN ONCE WEEKLY 6 mL 3   glucose blood (ONETOUCH VERIO) test strip USE TO CHECK BLOOD SUGAR TWO TIMES A DAY AS INSTRUCTED 100 strip 0   Insulin Glargine (BASAGLAR KWIKPEN) 100 UNIT/ML inject 28 units into the skin once daily.INJECT 28 UNITS INTO THE SKIN ONCE DAILY 15 mL 2   Insulin Pen Needle 31G X 5 MM MISC To Inject insulin 2 times daily 100 each 1   Lancets (ONETOUCH DELICA PLUS LANCET33G) MISC Use to check blood sugar 2 times a day 100 each 3   metFORMIN (GLUCOPHAGE-XR) 500 MG 24 hr tablet Take 4 tablets (2,000 mg total) by mouth daily with supper. (Patient taking differently: Take 2,000 mg by mouth daily with breakfast. Takes in the morning) 120 tablet 3   metoprolol succinate (TOPROL-XL) 100 MG 24 hr tablet Take 100 mg by mouth daily.     nystatin cream (MYCOSTATIN) Apply 1 application topically 2 (two) times daily.     telmisartan-hydrochlorothiazide (MICARDIS HCT) 80-25 MG tablet Take 1 tablet by mouth daily.     No current facility-administered medications for this visit.    PHYSICAL EXAM: Vitals:   02/28/23 1314  BP: 128/60  Pulse: 97  Resp: 20  SpO2: 99%   Weight: 282 lb 3.2 oz (128 kg)  Height: 6' 3.25" (1.911 m)     Body mass index is 35.04 kg/m.  Wt Readings from Last 3 Encounters:  02/28/23 282 lb 3.2 oz (128 kg)  11/28/22 279 lb 3.2 oz (126.6 kg)  08/23/22 280 lb 6.4 oz (127.2 kg)    General: Well developed, well nourished male in no apparent distress.  HEENT: AT/[place], no external lesions.  Eyes: Conjunctiva clear and no icterus. Neck: Neck supple  Lungs: Respirations not labored Neurologic: Alert, oriented, normal speech Extremities /  Skin: Dry. No sores or rashes noted.  Psychiatric: Does not appear depressed or anxious  Diabetic Foot Exam - Simple   Simple Foot Form Diabetic Foot exam was performed with the following findings: Yes 02/28/2023  1:25 PM  Visual Inspection See comments: Yes Sensation Testing Intact to touch and monofilament testing bilaterally: Yes Pulse Check Posterior Tibialis and Dorsalis pulse intact bilaterally: Yes Comments Left mild hammer toes deformity on left.    LABS Reviewed Lab Results  Component Value Date   HGBA1C 9.3 (A) 02/28/2023   HGBA1C 8.7 (A) 11/28/2022   HGBA1C 8.7 (H) 06/22/2022   Lab Results  Component Value Date   FRUCTOSAMINE 331 (H) 08/17/2022   FRUCTOSAMINE 303 (H) 06/29/2020   FRUCTOSAMINE 281 09/12/2019   Lab Results  Component Value Date   CHOL 153 11/08/2021   HDL 57.60 11/08/2021   LDLCALC 66 11/08/2021   LDLDIRECT 99.0 04/29/2021   TRIG 146.0 11/08/2021   CHOLHDL 3 11/08/2021   Lab Results  Component Value Date   MICRALBCREAT 0.4 06/22/2022   MICRALBCREAT 0.6 08/01/2021   Lab Results  Component Value Date   CREATININE 1.03 06/22/2022   Lab Results  Component Value Date   GFR 81.16 06/22/2022    ASSESSMENT / PLAN  1. Uncontrolled type 2 diabetes mellitus with hyperglycemia, with long-term current use of insulin (HCC)     Diabetes Mellitus type 2 - Diabetic status / severity: Uncontrolled  Lab Results  Component Value Date   HGBA1C 9.3  (A) 02/28/2023    - Hemoglobin A1c goal : <7%  Patient has uncontrolled type 2 diabetes mellitus mostly hemoglobin A1c in the range of 8%.  Correspondingly high fructosamine.  He has been off of Trulicity for a month.  Patient reports he has different medical insurance and would like to see if Greggory Keen would be covered.  He has reasonable blood sugar in the low 100s mostly in the morning fasting, I do not want to increase the dose of basal insulin.  - Medications: See below.  I) continue Basaglar 28 units daily. II) stop Trulicity 4.5 mg weekly.  He has not been taking for a month. III) continue Farxiga 10 mg daily. IV) continue metformin ER 2000 mg daily.  V) start Mounjaro 10 mg weekly.  Patient is asked to contact his pharmacy with updated medical insurance and call our clinic with any questions in regard to approval for [facility] versus any other alternative.  - Home glucose testing: at least two times a day fasting and at bedtime and occasionally 2 hours after meals in the afternoon.  - Discussed/ Gave Hypoglycemia treatment plan.  # Consult : not required at this time.   # Annual urine for microalbuminuria/ creatinine ratio, no microalbuminuria currently, continue ACE/ARB on telmisartan Last  Lab Results  Component Value Date   MICRALBCREAT 0.4 06/22/2022    # Foot check nightly.  # Annual dilated diabetic eye exams.  Advised to make diabetic eye exam follow-up.  - Diet: Make healthy diabetic food choices - Life style / activity / exercise: discussed, started exercise.   2. Blood pressure  -  BP Readings from Last 1 Encounters:  02/28/23 128/60    - Control is in target.  - No change in current plans.  3. Lipid status / Hyperlipidemia - Last  Lab Results  Component Value Date   LDLCALC 66 11/08/2021   - Continue atorvastatin 80mg  daily.   -Advised to have lipid level checked today, patient wants to do  with PCP visit tomorrow.  Diagnoses and all orders for  this visit:  Uncontrolled type 2 diabetes mellitus with hyperglycemia, with long-term current use of insulin (HCC) -     POCT glycosylated hemoglobin (Hb A1C) -     tirzepatide (MOUNJARO) 10 MG/0.5ML Pen; Inject 10 mg into the skin once a week.     DISPOSITION Follow up in clinic in 3 months suggested.   All questions answered and patient verbalized understanding of the plan.  [place] Donnia Poplaski, MD [facility] End[facility]inology [facility] [address] [place], [address] [place], [place] [address] Phone # [phone]  At least part of this note was generated using voice recognition software. Inadvertent word errors may have occurred, which were not recognized during the proofreading process.

## 2023-03-07 LAB — HM DIABETES EYE EXAM

## 2023-03-30 ENCOUNTER — Telehealth: Payer: Self-pay

## 2023-03-30 DIAGNOSIS — Z794 Long term (current) use of insulin: Secondary | ICD-10-CM

## 2023-03-30 NOTE — Telephone Encounter (Signed)
 Patient called sating he received a letter from his insurance that Mariella Saa was not covered requested patient switch to alternative Lantus or Toujeo Solo per patient.

## 2023-04-02 MED ORDER — LANTUS SOLOSTAR 100 UNIT/ML ~~LOC~~ SOPN: 28.0000 [IU] | PEN_INJECTOR | Freq: Every day | SUBCUTANEOUS | 11 refills | Status: DC

## 2023-04-02 NOTE — Addendum Note (Signed)
 Addended by: Indica Marcott, [place] on: 04/02/2023 08:09 AM   Modules accepted: Orders

## 2023-04-02 NOTE — Telephone Encounter (Addendum)
 Sent prescription for Lantus.  Same dose as Basaglar.

## 2023-04-02 NOTE — Telephone Encounter (Signed)
 Patient notified

## 2023-04-19 ENCOUNTER — Other Ambulatory Visit: Payer: Self-pay

## 2023-04-19 DIAGNOSIS — E1165 Type 2 diabetes mellitus with hyperglycemia: Secondary | ICD-10-CM

## 2023-04-19 MED ORDER — INSULIN PEN NEEDLE 31G X 5 MM MISC: 1 refills | Status: DC

## 2023-05-09 ENCOUNTER — Telehealth: Payer: Self-pay | Admitting: Endocrinology

## 2023-05-09 NOTE — Telephone Encounter (Signed)
 Patient came in to office today and brought an "Insulin -Treated Diabetes Mellitus Assessment Form" to be completed.  The for is sin Dr. Kem Patten folder in the front office.

## 2023-05-09 NOTE — Telephone Encounter (Signed)
 Form picked up from folder

## 2023-05-30 ENCOUNTER — Ambulatory Visit: Payer: Self-pay | Admitting: Endocrinology

## 2023-05-30 ENCOUNTER — Ambulatory Visit: Payer: 59 | Admitting: Endocrinology

## 2023-05-30 ENCOUNTER — Encounter: Payer: Self-pay | Admitting: Endocrinology

## 2023-05-30 VITALS — BP 132/80 | HR 72 | Resp 20 | Ht 75.25 in | Wt 279.6 lb

## 2023-05-30 DIAGNOSIS — Z794 Long term (current) use of insulin: Secondary | ICD-10-CM | POA: Diagnosis not present

## 2023-05-30 DIAGNOSIS — E1165 Type 2 diabetes mellitus with hyperglycemia: Secondary | ICD-10-CM

## 2023-05-30 LAB — POCT GLYCOSYLATED HEMOGLOBIN (HGB A1C): Hemoglobin A1C: 6.9 % — AB (ref 4.0–5.6)

## 2023-05-30 MED ORDER — LANTUS SOLOSTAR 100 UNIT/ML ~~LOC~~ SOPN: 25.0000 [IU] | PEN_INJECTOR | Freq: Every day | SUBCUTANEOUS | 11 refills | Status: AC

## 2023-05-30 MED ORDER — TIRZEPATIDE 12.5 MG/0.5ML ~~LOC~~ SOAJ
12.5000 mg | SUBCUTANEOUS | 3 refills | Status: DC
Start: 2023-05-30 — End: 2023-10-03

## 2023-05-30 NOTE — Patient Instructions (Signed)
 Latest Reference Range & Units 01/18/21 08:16 04/29/21 08:14 08/01/21 08:34 11/08/21 08:28 03/14/22 08:25 06/22/22 08:03 11/28/22 13:17 02/28/23 13:15 05/30/23 13:12  Hemoglobin A1C 4.0 - 5.6 % 8.3 (H) 8.5 (H) 8.7 (H) 8.4 (H) 8.8 (H) 8.7 (H) 8.7 ! Pend 9.3 ! Pend 6.9 !  (H): Data is abnormally high !: Data is abnormal

## 2023-05-30 NOTE — Progress Notes (Signed)
 Outpatient Endocrinology Note [place] Winferd Wease, MD  05/30/23  Patient Name: Gregory Brady   DOB : 03-30-1965 MRN # [id number]                                                    REASON OF VISIT: Follow up for type 2 diabetes mellitus  PCP: Elida Grounds, DO  HISTORY OF PRESENT ILLNESS:   Gregory Brady is a 58 y.o. old male with past medical history listed below, is here for follow up of type 2 diabetes mellitus.   Pertinent Diabetes History: Diagnosed with type 2 diabetes mellitus around 1997.  He was initially treated with metformin  and glipizide  in the beginning.  Patient was treated with Januvia which was changed to Onglyza in 2017.  He mostly has hemoglobin A1c in the range of 8%.  He was later treated with insulin  therapy.  He fructosamine was also monitored in the past which was corresponding with A1c.  Chronic Diabetes Complications : Retinopathy: yes, mild NPDR on left. Last ophthalmology exam was done on 02/2023, reportedly, office note reviewed. Nephropathy: no, on imlesartan Peripheral neuropathy: no Coronary artery disease: no Stroke: no  Relevant comorbidities and cardiovascular risk factors: Obesity: yes Body mass index is 34.72 kg/m.  Status post gastric band in 2009. Hypertension: Yes Hyperlipidemia.  Yes, on statin  Current / Home Diabetic regimen includes: Basaglar  28 units daily. Mounnjaro 10 mg weekly Farxiga  10 mg daily. Metformin  extended release 2000 mg daily.  Prior diabetic medications: Glipizide , Januvia, Onglyza Periodic balanitis from Jardiance   Mounjaro  was tried in the past however was not cost effective.  Trulicity  changed to Mounjaro  in February 2024.  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 He has One Touch Verio flex, glucometer download reviewed from April 32 May 30, 2023, average blood sugar 99.  He has been checking blood sugar occasionally in the morning fasting some of the blood sugar 115, 81,  100.  Hypoglycemia: Patient has no hypoglycemic episodes. Patient has hypoglycemia awareness.  Factors modifying glucose control: 1.  Diabetic diet assessment: eating better.  Cut back on drinks and sodas.  2.  Staying active or exercising: Busy at work..   3.  Medication compliance: compliant most of the time.  Other: -He has obstructive sleep apnea on CPAP.  Interval history  Hemoglobin A1c improved to 6.9% today, congratulated him.  He has been taking Mounjaro  and tolerating well.  Diabetes regimen as reviewed and noted above.  Denies complaints of numbness and ting of the feet.  No vision problem.  He had diabetic eye exam in February, noted to have mild nonproliferative diabetic retinopathy of the left eye.  No other complaints today.  REVIEW OF SYSTEMS As per history of present illness.   PAST MEDICAL HISTORY: Past Medical History:  Diagnosis Date   Diabetes mellitus    Hyperlipidemia    Hypertension     PAST SURGICAL HISTORY: Past Surgical History:  Procedure Laterality Date   LAPAROSCOPIC GASTRIC BANDING  01/29/2007    ALLERGIES: Allergies  Allergen Reactions   Ramipril Cough    FAMILY HISTORY:  History reviewed. No pertinent family history.  SOCIAL HISTORY: Social History   Socioeconomic History   Marital status: Single    Spouse name: Not on file   Number of children:  Not on file   Years of education: Not on file   Highest education level: Not on file  Occupational History   Not on file  Tobacco Use   Smoking status: Never   Smokeless tobacco: Never  Substance and Sexual Activity   Alcohol use: Yes    Alcohol/week: 5.0 standard drinks of alcohol    Types: 5 Shots of liquor per week   Drug use: No   Sexual activity: Not on file  Other Topics Concern   Not on file  Social History Narrative   Not on file   Social Drivers of Health   Financial Resource Strain: Not on file  Food Insecurity: Not on file  Transportation Needs: Not on file   Physical Activity: Not on file  Stress: Not on file  Social Connections: Not on file    MEDICATIONS:  Current Outpatient Medications  Medication Sig Dispense Refill   tirzepatide  (MOUNJARO ) 12.5 MG/0.5ML Pen Inject 12.5 mg into the skin once a week. 6 mL 3   amLODipine (NORVASC) 10 MG tablet Take 10 mg by mouth daily.     aspirin EC 81 MG tablet Take 81 mg by mouth daily.     atorvastatin (LIPITOR) 80 MG tablet Take 80 mg by mouth daily.   1   Blood Glucose Monitoring Suppl (ONETOUCH VERIO FLEX SYSTEM) w/Device KIT Use to check blood sugar 2 times a day 1 kit 0   Cyanocobalamin (B-12 PO) Take by mouth daily at 6 (six) AM.     dapagliflozin  propanediol (FARXIGA ) 10 MG TABS tablet Take 1 tablet (10 mg total) by mouth daily. 90 tablet 0   glucose blood (ONETOUCH VERIO) test strip USE TO CHECK BLOOD SUGAR TWO TIMES A DAY AS INSTRUCTED 100 strip 0   insulin  glargine (LANTUS  SOLOSTAR) 100 UNIT/ML Solostar Pen Inject 25 Units into the skin daily. 15 mL 11   Insulin  Pen Needle 31G X 5 MM MISC To Inject insulin  2 times daily 100 each 1   Lancets (ONETOUCH DELICA PLUS LANCET33G) MISC Use to check blood sugar 2 times a day 100 each 3   metFORMIN  (GLUCOPHAGE -XR) 500 MG 24 hr tablet Take 4 tablets (2,000 mg total) by mouth daily with supper. (Patient taking differently: Take 2,000 mg by mouth daily with breakfast. Takes in the morning) 120 tablet 3   metoprolol succinate (TOPROL-XL) 100 MG 24 hr tablet Take 100 mg by mouth daily.     nystatin cream (MYCOSTATIN) Apply 1 application topically 2 (two) times daily.     telmisartan-hydrochlorothiazide (MICARDIS HCT) 80-25 MG tablet Take 1 tablet by mouth daily.     No current facility-administered medications for this visit.    PHYSICAL EXAM: Vitals:   05/30/23 1310  BP: 132/80  Pulse: 72  Resp: 20  SpO2: 98%  Weight: 279 lb 9.6 oz (126.8 kg)  Height: 6' 3.25" (1.911 m)      Body mass index is 34.72 kg/m.  Wt Readings from Last 3 Encounters:   05/30/23 279 lb 9.6 oz (126.8 kg)  02/28/23 282 lb 3.2 oz (128 kg)  11/28/22 279 lb 3.2 oz (126.6 kg)    General: Well developed, well nourished male in no apparent distress.  HEENT: AT/[place], no external lesions.  Eyes: Conjunctiva clear and no icterus. Neck: Neck supple  Lungs: Respirations not labored Neurologic: Alert, oriented, normal speech Extremities / Skin: Dry.   Psychiatric: Does not appear depressed or anxious  Diabetic Foot Exam - Simple   No data filed  LABS Reviewed Lab Results  Component Value Date   HGBA1C 6.9 (A) 05/30/2023   HGBA1C 9.3 (A) 02/28/2023   HGBA1C 8.7 (A) 11/28/2022   Lab Results  Component Value Date   FRUCTOSAMINE 331 (H) 08/17/2022   FRUCTOSAMINE 303 (H) 06/29/2020   FRUCTOSAMINE 281 09/12/2019   Lab Results  Component Value Date   CHOL 153 11/08/2021   HDL 57.60 11/08/2021   LDLCALC 66 11/08/2021   LDLDIRECT 99.0 04/29/2021   TRIG 146.0 11/08/2021   CHOLHDL 3 11/08/2021   Lab Results  Component Value Date   MICRALBCREAT 0.4 06/22/2022   MICRALBCREAT 0.6 08/01/2021   Lab Results  Component Value Date   CREATININE 1.03 06/22/2022   Lab Results  Component Value Date   GFR 81.16 06/22/2022    ASSESSMENT / PLAN  1. Uncontrolled type 2 diabetes mellitus with hyperglycemia, with long-term current use of insulin  (HCC)     Diabetes Mellitus type 2, complication with diabetic retinopathy. - Diabetic status / severity: Uncontrolled, improving.  Lab Results  Component Value Date   HGBA1C 6.9 (A) 05/30/2023    - Hemoglobin A1c goal : <6.5%  He has significant improvement of diabetes control.  He has been off of Trulicity  for a month.  Patient reports he has different medical insurance and would like to see if Mounjaro  would be covered.  He has reasonable blood sugar in the 80 - low 100s mostly in the morning fasting.  - Medications: See below.  I) decrease Basaglar  28 units daily, to 25 units.  He will be taking  Lantus  going forward. II) increase Mounjaro  from 10 to 12.5 mg weekly. III) continue Farxiga  10 mg daily. IV) continue metformin  ER 2000 mg daily.   Patient is asked to call our clinic if he has no GI issues after a month, Mounjaro  can be increased to 15 mg weekly in between the visits.  - Home glucose testing: at least two times a day fasting and at bedtime and occasionally 2 hours after meals in the afternoon.   - Discussed/ Gave Hypoglycemia treatment plan.  # Consult : not required at this time.   # Annual urine for microalbuminuria/ creatinine ratio, no microalbuminuria currently, continue ACE/ARB on telmisartan Last  Lab Results  Component Value Date   MICRALBCREAT 0.4 06/22/2022    # Foot check nightly.  # Annual dilated diabetic eye exams.  He has diabetic retinopathy, regular follow-up with ophthalmology.  - Diet: Make healthy diabetic food choices - Life style / activity / exercise: discussed, started exercise.   2. Blood pressure  -  BP Readings from Last 1 Encounters:  05/30/23 132/80    - Control is in target.  - No change in current plans.  3. Lipid status / Hyperlipidemia - Last  Lab Results  Component Value Date   LDLCALC 66 11/08/2021   - Continue atorvastatin 80mg  daily.  Managed by primary care provider.  Diagnoses and all orders for this visit:  Uncontrolled type 2 diabetes mellitus with hyperglycemia, with long-term current use of insulin  (HCC) -     POCT glycosylated hemoglobin (Hb A1C) -     tirzepatide  (MOUNJARO ) 12.5 MG/0.5ML Pen; Inject 12.5 mg into the skin once a week. -     insulin  glargine (LANTUS  SOLOSTAR) 100 UNIT/ML Solostar Pen; Inject 25 Units into the skin daily. -     Basic Metabolic Panel Without GFR -     Hemoglobin A1c -     Microalbumin / creatinine urine ratio  DISPOSITION Follow up in clinic in 4 months suggested.  Labs prior to follow-up visit as ordered.   All questions answered and patient verbalized understanding  of the plan.  [place] Jaegar Croft, MD [facility] Endocrinology [facility] [address] [place], [address] [place], [place] [address] Phone # [phone]  At least part of this note was generated using voice recognition software. Inadvertent word errors may have occurred, which were not recognized during the proofreading process.

## 2023-06-01 ENCOUNTER — Other Ambulatory Visit (HOSPITAL_COMMUNITY): Payer: Self-pay

## 2023-06-01 ENCOUNTER — Telehealth: Payer: Self-pay | Admitting: Pharmacy Technician

## 2023-06-01 NOTE — Telephone Encounter (Signed)
 Pharmacy Patient Advocate Encounter   Received notification from CoverMyMeds that prior authorization for Mounjaro  5MG /0.5ML auto-injectors is due for renewal.   Insurance verification completed.   The patient is insured through [facility].  Action: Medication has been discontinued. Archived Key: [id number]

## 2023-06-27 ENCOUNTER — Telehealth: Payer: Self-pay | Admitting: Pharmacy Technician

## 2023-06-27 ENCOUNTER — Other Ambulatory Visit (HOSPITAL_COMMUNITY): Payer: Self-pay

## 2023-06-27 NOTE — Telephone Encounter (Signed)
 Pharmacy Patient Advocate Encounter  Received notification from [employer] that Prior Authorization for Mounjaro  12.5MG /0.5ML auto-injectors has been APPROVED from 06/27/2023 to 06/26/2024. Ran test claim, Copay is $25.00. This test claim was processed through [facility]- copay amounts may vary at other pharmacies due to pharmacy/plan contracts, or as the patient moves through the different stages of their insurance plan.   PA #/Case ID/Reference #: [id number]

## 2023-06-27 NOTE — Telephone Encounter (Signed)
 Pharmacy Patient Advocate Encounter   Received notification from CoverMyMeds that prior authorization for Mounjaro  12.5MG /0.5ML auto-injectors is required/requested.   Insurance verification completed.   The patient is insured through [facility] .   Per test claim: PA required; PA submitted to above mentioned insurance via CoverMyMeds Key/confirmation #/EOC [id number] Status is pending

## 2023-07-26 ENCOUNTER — Other Ambulatory Visit (HOSPITAL_COMMUNITY): Payer: Self-pay

## 2023-07-26 ENCOUNTER — Telehealth: Payer: Self-pay | Admitting: Pharmacy Technician

## 2023-07-26 NOTE — Telephone Encounter (Signed)
 Pharmacy Patient Advocate Encounter   Received notification from CoverMyMeds that prior authorization for Farxiga  10MG  tablets is required/requested.   Insurance verification completed.   The patient is insured through [facility] .   Per test claim: PA required; PA submitted to above mentioned insurance via CoverMyMeds Key/confirmation #/EOC [id number] Status is pending

## 2023-07-26 NOTE — Telephone Encounter (Signed)
 Pharmacy Patient Advocate Encounter  Received notification from [employer] that Prior Authorization for Farxiga  10MG  tablets has been APPROVED from 07/26/23 to 07/25/24. Unable to obtain price due to refill too soon rejection, last fill date 07/26/23 next available fill date 08/18/23   PA #/Case ID/Reference #: [id number]

## 2023-09-15 ENCOUNTER — Emergency Department (HOSPITAL_BASED_OUTPATIENT_CLINIC_OR_DEPARTMENT_OTHER)
Admission: EM | Admit: 2023-09-15 | Discharge: 2023-09-15 | Disposition: A | Discharge status: Home / Self Care | Attending: Emergency Medicine | Admitting: Emergency Medicine

## 2023-09-15 ENCOUNTER — Encounter (HOSPITAL_BASED_OUTPATIENT_CLINIC_OR_DEPARTMENT_OTHER): Payer: Self-pay | Admitting: Emergency Medicine

## 2023-09-15 ENCOUNTER — Other Ambulatory Visit: Payer: Self-pay

## 2023-09-15 DIAGNOSIS — Z7982 Long term (current) use of aspirin: Secondary | ICD-10-CM | POA: Insufficient documentation

## 2023-09-15 DIAGNOSIS — E119 Type 2 diabetes mellitus without complications: Secondary | ICD-10-CM | POA: Diagnosis not present

## 2023-09-15 DIAGNOSIS — Y92410 Unspecified street and highway as the place of occurrence of the external cause: Secondary | ICD-10-CM | POA: Diagnosis not present

## 2023-09-15 DIAGNOSIS — Z79899 Other long term (current) drug therapy: Secondary | ICD-10-CM | POA: Diagnosis not present

## 2023-09-15 DIAGNOSIS — S060X0A Concussion without loss of consciousness, initial encounter: Secondary | ICD-10-CM | POA: Diagnosis not present

## 2023-09-15 DIAGNOSIS — I10 Essential (primary) hypertension: Secondary | ICD-10-CM | POA: Diagnosis not present

## 2023-09-15 DIAGNOSIS — S91002A Unspecified open wound, left ankle, initial encounter: Secondary | ICD-10-CM | POA: Diagnosis not present

## 2023-09-15 DIAGNOSIS — Y9355 Activity, bike riding: Secondary | ICD-10-CM | POA: Diagnosis not present

## 2023-09-15 DIAGNOSIS — Z794 Long term (current) use of insulin: Secondary | ICD-10-CM | POA: Diagnosis not present

## 2023-09-15 DIAGNOSIS — Z7984 Long term (current) use of oral hypoglycemic drugs: Secondary | ICD-10-CM | POA: Insufficient documentation

## 2023-09-15 DIAGNOSIS — R42 Dizziness and giddiness: Secondary | ICD-10-CM | POA: Diagnosis present

## 2023-09-15 MED ORDER — MECLIZINE HCL 25 MG PO TABS
25.0000 mg | ORAL_TABLET | Freq: Three times a day (TID) | ORAL | 0 refills | Status: AC | PRN
Start: 2023-09-15 — End: ?
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
## 2023-09-15 NOTE — Discharge Instructions (Signed)
 You may take meclizine  as needed for dizziness.  Monitor daily the wound on your left ankle carefully and return if you notice any signs of infection.

## 2023-09-18 ENCOUNTER — Other Ambulatory Visit: Payer: Self-pay | Admitting: Endocrinology

## 2023-09-18 DIAGNOSIS — E1165 Type 2 diabetes mellitus with hyperglycemia: Secondary | ICD-10-CM

## 2023-09-19 ENCOUNTER — Other Ambulatory Visit: Payer: Self-pay | Admitting: Urology

## 2023-09-19 DIAGNOSIS — R972 Elevated prostate specific antigen [PSA]: Secondary | ICD-10-CM

## 2023-09-24 ENCOUNTER — Encounter: Payer: Self-pay | Admitting: Urology

## 2023-09-27 ENCOUNTER — Other Ambulatory Visit

## 2023-09-28 LAB — BASIC METABOLIC PANEL WITHOUT GFR
BUN: 13 mg/dL (ref 7–25)
CO2: 28 mmol/L (ref 20–32)
Calcium: 9.7 mg/dL (ref 8.6–10.3)
Chloride: 102 mmol/L (ref 98–110)
Creat: 0.83 mg/dL (ref 0.70–1.30)
Glucose, Bld: 105 mg/dL — ABNORMAL HIGH (ref 65–99)
Potassium: 4 mmol/L (ref 3.5–5.3)
Sodium: 141 mmol/L (ref 135–146)

## 2023-09-28 LAB — MICROALBUMIN / CREATININE URINE RATIO
Creatinine, Urine: 152 mg/dL (ref 20–320)
Microalb Creat Ratio: 13 mg/g{creat} (ref <30)
Microalb, Ur: 2 mg/dL

## 2023-09-28 LAB — HEMOGLOBIN A1C
Hgb A1c MFr Bld: 7.2 % — ABNORMAL HIGH (ref <5.7)
Mean Plasma Glucose: 160 mg/dL
eAG (mmol/L): 8.9 mmol/L

## 2023-10-03 ENCOUNTER — Ambulatory Visit: Admitting: Endocrinology

## 2023-10-03 ENCOUNTER — Encounter: Payer: Self-pay | Admitting: Endocrinology

## 2023-10-03 VITALS — BP 120/70 | HR 81 | Ht 75.25 in | Wt 268.2 lb

## 2023-10-03 DIAGNOSIS — Z794 Long term (current) use of insulin: Secondary | ICD-10-CM

## 2023-10-03 DIAGNOSIS — E1165 Type 2 diabetes mellitus with hyperglycemia: Secondary | ICD-10-CM

## 2023-10-03 MED ORDER — TIRZEPATIDE 15 MG/0.5ML ~~LOC~~ SOAJ: 15.0000 mg | SUBCUTANEOUS | 4 refills | Status: AC

## 2023-10-03 NOTE — Progress Notes (Signed)
 Outpatient Endocrinology Note [place] Verdis Koval, MD  10/03/23  Patient Name: Gregory Brady   DOB : 04/01/1965 MRN # [id number]                                                    REASON OF VISIT: Follow up for type 2 diabetes mellitus  PCP: Auston Opal, DO  HISTORY OF PRESENT ILLNESS:   BURMAN BRUINGTON is a 58 y.o. old male with past medical history listed below, is here for follow up of type 2 diabetes mellitus.   Pertinent Diabetes History: Diagnosed with type 2 diabetes mellitus around 1997.  He was initially treated with metformin  and glipizide  in the beginning.  Patient was treated with Januvia which was changed to Onglyza in 2017.  He mostly has hemoglobin A1c in the range of 8%.  He was later treated with insulin  therapy.  He fructosamine was also monitored in the past which was corresponding with A1c.  Chronic Diabetes Complications : Retinopathy: yes, mild NPDR on left. Last ophthalmology exam was done on 02/2023, reportedly, office note reviewed. Nephropathy: no, on imlesartan Peripheral neuropathy: no Coronary artery disease: no Stroke: no  Relevant comorbidities and cardiovascular risk factors: Obesity: yes Body mass index is 33.3 kg/m.  Status post gastric band in 2009. Hypertension: Yes Hyperlipidemia.  Yes, on statin  Current / Home Diabetic regimen includes: Lantus  24 units daily. Mounnjaro 12.5 mg weekly Farxiga  10 mg daily. Metformin  extended release 2000 mg daily.  Prior diabetic medications: Glipizide , Januvia, Onglyza Periodic balanitis from [facility]   Mounjaro  was tried in the past however was not cost effective.  Trulicity  changed to Mounjaro  in February 2024.  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  He has One Touch Verio flex, glucometer.  No glucose data to review.  Lately he has not been checking blood sugar at home.  Hypoglycemia: Patient has no hypoglycemic episodes. Patient has hypoglycemia  awareness.  Factors modifying glucose control: 1.  Diabetic diet assessment: eating better.  Cut back on drinks and sodas.  2.  Staying active or exercising: Busy at work..   3.  Medication compliance: compliant most of the time.  Other: -He has obstructive sleep apnea on CPAP.  Interval history Hemoglobin A1c 8.2%.  Recent lab results reviewed.  Normal renal function and normal urine microalbumin creatinine ratio.  Diabetes regimen as reviewed and noted above.  He has no other complaints today.    REVIEW OF SYSTEMS As per history of present illness.   PAST MEDICAL HISTORY: Past Medical History:  Diagnosis Date   Diabetes mellitus    Hyperlipidemia    Hypertension     PAST SURGICAL HISTORY: Past Surgical History:  Procedure Laterality Date   LAPAROSCOPIC GASTRIC BANDING  01/29/2007    ALLERGIES: Allergies  Allergen Reactions   Ramipril Cough    FAMILY HISTORY:  History reviewed. No pertinent family history.  SOCIAL HISTORY: Social History   Socioeconomic History   Marital status: Single    Spouse name: Not on file   Number of children: Not on file   Years of education: Not on file   Highest education level: Not on file  Occupational History   Not on file  Tobacco Use   Smoking status: Never   Smokeless tobacco: Never  Substance and  Sexual Activity   Alcohol use: Yes    Alcohol/week: 5.0 standard drinks of alcohol    Types: 5 Shots of liquor per week   Drug use: No   Sexual activity: Not on file  Other Topics Concern   Not on file  Social History Narrative   Not on file   Social Drivers of Health   Financial Resource Strain: Not on file  Food Insecurity: Not on file  Transportation Needs: Not on file  Physical Activity: Not on file  Stress: Not on file  Social Connections: Not on file    MEDICATIONS:  Current Outpatient Medications  Medication Sig Dispense Refill   amLODipine (NORVASC) 10 MG tablet Take 10 mg by mouth daily.     aspirin EC  81 MG tablet Take 81 mg by mouth daily.     atorvastatin (LIPITOR) 80 MG tablet Take 80 mg by mouth daily.   1   Blood Glucose Monitoring Suppl (ONETOUCH VERIO FLEX SYSTEM) w/Device KIT Use to check blood sugar 2 times a day 1 kit 0   Cyanocobalamin (B-12 PO) Take by mouth daily at 6 (six) AM.     dapagliflozin  propanediol (FARXIGA ) 10 MG TABS tablet Take 1 tablet (10 mg total) by mouth daily. 90 tablet 3   glucose blood (ONETOUCH VERIO) test strip USE TO CHECK BLOOD SUGAR TWO TIMES A DAY AS INSTRUCTED 100 strip 0   insulin  glargine (LANTUS  SOLOSTAR) 100 UNIT/ML Solostar Pen Inject 25 Units into the skin daily. (Patient taking differently: Inject 24 Units into the skin daily.) 15 mL 11   Insulin  Pen Needle 31G X 5 MM MISC To Inject insulin  2 times daily 100 each 1   Lancets (ONETOUCH DELICA PLUS LANCET33G) MISC Use to check blood sugar 2 times a day 100 each 3   meclizine  (ANTIVERT ) 25 MG tablet Take 1 tablet (25 mg total) by mouth 3 (three) times daily as needed for dizziness. 30 tablet 0   metFORMIN  (GLUCOPHAGE -XR) 500 MG 24 hr tablet Take 4 tablets (2,000 mg total) by mouth daily with supper. (Patient taking differently: Take 2,000 mg by mouth daily with breakfast. Takes in the morning) 120 tablet 3   metoprolol succinate (TOPROL-XL) 100 MG 24 hr tablet Take 100 mg by mouth daily.     nystatin cream (MYCOSTATIN) Apply 1 application topically 2 (two) times daily.     tirzepatide  (MOUNJARO ) 15 MG/0.5ML Pen Inject 15 mg into the skin once a week. 6 mL 4   telmisartan-hydrochlorothiazide (MICARDIS HCT) 80-25 MG tablet Take 1 tablet by mouth daily. (Patient not taking: Reported on 10/03/2023)     No current facility-administered medications for this visit.    PHYSICAL EXAM: Vitals:   10/03/23 1305  BP: 120/70  Pulse: 81  SpO2: 96%  Weight: 268 lb 3.2 oz (121.7 kg)  Height: 6' 3.25 (1.911 m)      Body mass index is 33.3 kg/m.  Wt Readings from Last 3 Encounters:  10/03/23 268 lb 3.2 oz  (121.7 kg)  05/30/23 279 lb 9.6 oz (126.8 kg)  02/28/23 282 lb 3.2 oz (128 kg)    General: Well developed, well nourished male in no apparent distress.  HEENT: AT/[place], no external lesions.  Eyes: Conjunctiva clear and no icterus. Neck: Neck supple  Lungs: Respirations not labored Neurologic: Alert, oriented, normal speech Extremities / Skin: Dry.   Psychiatric: Does not appear depressed or anxious  Diabetic Foot Exam - Simple   No data filed    LABS  Reviewed Lab Results  Component Value Date   HGBA1C 7.2 (H) 09/27/2023   HGBA1C 6.9 (A) 05/30/2023   HGBA1C 9.3 (A) 02/28/2023   Lab Results  Component Value Date   FRUCTOSAMINE 331 (H) 08/17/2022   FRUCTOSAMINE 303 (H) 06/29/2020   FRUCTOSAMINE 281 09/12/2019   Lab Results  Component Value Date   CHOL 153 11/08/2021   HDL 57.60 11/08/2021   LDLCALC 66 11/08/2021   LDLDIRECT 99.0 04/29/2021   TRIG 146.0 11/08/2021   CHOLHDL 3 11/08/2021   Lab Results  Component Value Date   MICRALBCREAT 13 09/27/2023   Lab Results  Component Value Date   CREATININE 0.83 09/27/2023   Lab Results  Component Value Date   GFR 81.16 06/22/2022    ASSESSMENT / PLAN  1. Uncontrolled type 2 diabetes mellitus with hyperglycemia, with long-term current use of insulin  (HCC)      Diabetes Mellitus type 2, complication with diabetic retinopathy. - Diabetic status / severity: Uncontrolled, improving.  Lab Results  Component Value Date   HGBA1C 7.2 (H) 09/27/2023    - Hemoglobin A1c goal : <6.5%  He has significant improvement of diabetes control.   - Medications: See below.  I) Stay on Lantus  25 units daily.   II) increase Mounjaro  12.5 mg to 15 mg weekly. III) continue Farxiga  10 mg daily. IV) continue metformin  ER 2000 mg daily.   - Home glucose testing: at least two times a day fasting and at bedtime and occasionally 2 hours after meals in the afternoon.   - Discussed/ Gave Hypoglycemia treatment plan.  # Consult :  not required at this time.   # Annual urine for microalbuminuria/ creatinine ratio, no microalbuminuria currently, continue ACE/ARB on telmisartan /Farxiga . Last  Lab Results  Component Value Date   MICRALBCREAT 13 09/27/2023    # Foot check nightly.  # Annual dilated diabetic eye exams.  He has diabetic retinopathy, regular follow-up with ophthalmology.  - Diet: Make healthy diabetic food choices - Life style / activity / exercise: discussed, started exercise.   2. Blood pressure  -  BP Readings from Last 1 Encounters:  10/03/23 120/70    - Control is in target.  - No change in current plans.  3. Lipid status / Hyperlipidemia - Last  Lab Results  Component Value Date   LDLCALC 66 11/08/2021   - Continue atorvastatin 80mg  daily.  Managed by primary care provider.  Diagnoses and all orders for this visit:  Uncontrolled type 2 diabetes mellitus with hyperglycemia, with long-term current use of insulin  (HCC) -     tirzepatide  (MOUNJARO ) 15 MG/0.5ML Pen; Inject 15 mg into the skin once a week.    DISPOSITION Follow up in clinic in 4 months suggested.     All questions answered and patient verbalized understanding of the plan.  [place] Abriella Filkins, MD [facility] Endocrinology [facility] [address] [place], [address] [place], [place] [address] Phone # [phone]  At least part of this note was generated using voice recognition software. Inadvertent word errors may have occurred, which were not recognized during the proofreading process.

## 2023-10-08 ENCOUNTER — Ambulatory Visit
Admission: RE | Admit: 2023-10-08 | Discharge: 2023-10-08 | Disposition: A | Discharge status: Home / Self Care | Source: Ambulatory Visit | Attending: Urology | Admitting: Urology

## 2023-10-08 DIAGNOSIS — R972 Elevated prostate specific antigen [PSA]: Secondary | ICD-10-CM

## 2023-10-08 MED ORDER — GADOPICLENOL 0.5 MMOL/ML IV SOLN
10.0000 mL | Freq: Once | INTRAVENOUS | Status: AC | PRN
  Administered 2023-10-08: 10 mL via INTRAVENOUS

## 2023-11-01 ENCOUNTER — Other Ambulatory Visit: Payer: Self-pay

## 2023-11-01 DIAGNOSIS — E1165 Type 2 diabetes mellitus with hyperglycemia: Secondary | ICD-10-CM

## 2023-11-01 MED ORDER — ACCU-CHEK GUIDE W/DEVICE KIT: PACK | 0 refills | Status: AC

## 2023-11-01 MED ORDER — ACCU-CHEK GUIDE TEST VI STRP: ORAL_STRIP | 12 refills | Status: DC

## 2023-11-01 MED ORDER — ACCU-CHEK SOFTCLIX LANCETS MISC: 12 refills | Status: DC

## 2023-11-01 MED ORDER — ACCU-CHEK GUIDE TEST VI STRP: ORAL_STRIP | 12 refills | Status: AC

## 2023-11-01 MED ORDER — ACCU-CHEK SOFTCLIX LANCETS MISC: 12 refills | Status: AC

## 2023-11-01 MED ORDER — ACCU-CHEK GUIDE W/DEVICE KIT: PACK | 0 refills | Status: DC

## 2023-11-01 MED ORDER — ONETOUCH VERIO FLEX SYSTEM W/DEVICE KIT: PACK | 0 refills | Status: DC

## 2023-11-01 NOTE — Telephone Encounter (Signed)
 Patient left message stating he needs a new meter. Due to the recent changes with [facility] a different type was sent.

## 2023-12-25 ENCOUNTER — Other Ambulatory Visit: Payer: Self-pay | Admitting: Endocrinology

## 2024-02-04 ENCOUNTER — Other Ambulatory Visit: Payer: Self-pay

## 2024-02-04 ENCOUNTER — Ambulatory Visit: Admitting: Endocrinology

## 2024-02-04 DIAGNOSIS — E1165 Type 2 diabetes mellitus with hyperglycemia: Secondary | ICD-10-CM

## 2024-02-04 MED ORDER — INSULIN PEN NEEDLE 31G X 5 MM MISC: 3 refills | Status: AC

## 2024-03-05 ENCOUNTER — Ambulatory Visit: Admitting: Endocrinology
